# Patient Record
Sex: Male | Born: 1945
Health system: Southern US, Community
[De-identification: ages and names within clinical notes are randomized; demographics above are authoritative.]

## PROBLEM LIST (undated history)

## (undated) DIAGNOSIS — Z87442 Personal history of urinary calculi: Secondary | ICD-10-CM

## (undated) DIAGNOSIS — R0600 Dyspnea, unspecified: Secondary | ICD-10-CM

## (undated) DIAGNOSIS — Z86010 Personal history of colonic polyps: Secondary | ICD-10-CM

## (undated) DIAGNOSIS — E039 Hypothyroidism, unspecified: Secondary | ICD-10-CM

## (undated) DIAGNOSIS — K219 Gastro-esophageal reflux disease without esophagitis: Secondary | ICD-10-CM

## (undated) DIAGNOSIS — G4730 Sleep apnea, unspecified: Secondary | ICD-10-CM

## (undated) DIAGNOSIS — M1990 Unspecified osteoarthritis, unspecified site: Secondary | ICD-10-CM

## (undated) DIAGNOSIS — T7840XA Allergy, unspecified, initial encounter: Secondary | ICD-10-CM

## (undated) DIAGNOSIS — E785 Hyperlipidemia, unspecified: Secondary | ICD-10-CM

## (undated) DIAGNOSIS — F419 Anxiety disorder, unspecified: Secondary | ICD-10-CM

## (undated) DIAGNOSIS — G709 Myoneural disorder, unspecified: Secondary | ICD-10-CM

## (undated) DIAGNOSIS — F32A Depression, unspecified: Secondary | ICD-10-CM

## (undated) DIAGNOSIS — I2510 Atherosclerotic heart disease of native coronary artery without angina pectoris: Secondary | ICD-10-CM

## (undated) DIAGNOSIS — Z8719 Personal history of other diseases of the digestive system: Secondary | ICD-10-CM

## (undated) DIAGNOSIS — I714 Abdominal aortic aneurysm, without rupture, unspecified: Secondary | ICD-10-CM

## (undated) DIAGNOSIS — R42 Dizziness and giddiness: Secondary | ICD-10-CM

## (undated) HISTORY — DX: Unspecified osteoarthritis, unspecified site: M19.90

## (undated) HISTORY — PX: COLONOSCOPY: SHX174

## (undated) HISTORY — DX: Anxiety disorder, unspecified: F41.9

## (undated) HISTORY — PX: APPENDECTOMY: SHX54

## (undated) HISTORY — PX: KNEE SURGERY: SHX244

## (undated) HISTORY — PX: CHOLECYSTECTOMY: SHX55

## (undated) HISTORY — PX: HERNIA REPAIR: SHX51

## (undated) HISTORY — PX: ELBOW SURGERY: SHX618

## (undated) HISTORY — DX: Allergy, unspecified, initial encounter: T78.40XA

## (undated) HISTORY — DX: Sleep apnea, unspecified: G47.30

## (undated) HISTORY — DX: Personal history of colonic polyps: Z86.010

## (undated) HISTORY — PX: POLYPECTOMY: SHX149

## (undated) HISTORY — DX: Myoneural disorder, unspecified: G70.9

## (undated) HISTORY — PX: TONSILLECTOMY: SUR1361

## (undated) HISTORY — DX: Gastro-esophageal reflux disease without esophagitis: K21.9

## (undated) HISTORY — DX: Hyperlipidemia, unspecified: E78.5

---

## 2011-02-25 DIAGNOSIS — M75100 Unspecified rotator cuff tear or rupture of unspecified shoulder, not specified as traumatic: Secondary | ICD-10-CM | POA: Diagnosis not present

## 2011-02-25 DIAGNOSIS — M19019 Primary osteoarthritis, unspecified shoulder: Secondary | ICD-10-CM | POA: Diagnosis not present

## 2011-02-25 DIAGNOSIS — M719 Bursopathy, unspecified: Secondary | ICD-10-CM | POA: Diagnosis not present

## 2011-02-25 DIAGNOSIS — M24119 Other articular cartilage disorders, unspecified shoulder: Secondary | ICD-10-CM | POA: Diagnosis not present

## 2011-02-25 DIAGNOSIS — S46919A Strain of unspecified muscle, fascia and tendon at shoulder and upper arm level, unspecified arm, initial encounter: Secondary | ICD-10-CM | POA: Diagnosis not present

## 2011-02-25 DIAGNOSIS — S43499A Other sprain of unspecified shoulder joint, initial encounter: Secondary | ICD-10-CM | POA: Diagnosis not present

## 2011-02-25 DIAGNOSIS — M67919 Unspecified disorder of synovium and tendon, unspecified shoulder: Secondary | ICD-10-CM | POA: Diagnosis not present

## 2011-02-25 DIAGNOSIS — Z4789 Encounter for other orthopedic aftercare: Secondary | ICD-10-CM | POA: Diagnosis not present

## 2011-02-27 DIAGNOSIS — S42293A Other displaced fracture of upper end of unspecified humerus, initial encounter for closed fracture: Secondary | ICD-10-CM | POA: Diagnosis not present

## 2011-03-09 ENCOUNTER — Other Ambulatory Visit: Payer: Self-pay | Admitting: Orthopedic Surgery

## 2011-03-09 ENCOUNTER — Ambulatory Visit
Admission: RE | Admit: 2011-03-09 | Discharge: 2011-03-09 | Disposition: A | Discharge status: Home / Self Care | Payer: No Typology Code available for payment source | Source: Ambulatory Visit | Attending: Orthopedic Surgery | Admitting: Orthopedic Surgery

## 2011-03-09 DIAGNOSIS — S42209A Unspecified fracture of upper end of unspecified humerus, initial encounter for closed fracture: Secondary | ICD-10-CM

## 2011-03-09 DIAGNOSIS — IMO0002 Reserved for concepts with insufficient information to code with codable children: Secondary | ICD-10-CM | POA: Diagnosis not present

## 2011-03-31 DIAGNOSIS — Z860100 Personal history of colon polyps, unspecified: Secondary | ICD-10-CM | POA: Insufficient documentation

## 2011-03-31 HISTORY — DX: Personal history of colonic polyps: Z86.010

## 2011-03-31 HISTORY — DX: Personal history of colon polyps, unspecified: Z86.0100

## 2011-05-29 DIAGNOSIS — M25519 Pain in unspecified shoulder: Secondary | ICD-10-CM | POA: Diagnosis not present

## 2011-07-07 DIAGNOSIS — IMO0002 Reserved for concepts with insufficient information to code with codable children: Secondary | ICD-10-CM | POA: Diagnosis not present

## 2011-07-07 DIAGNOSIS — S42213A Unspecified displaced fracture of surgical neck of unspecified humerus, initial encounter for closed fracture: Secondary | ICD-10-CM | POA: Diagnosis not present

## 2011-07-10 DIAGNOSIS — M25519 Pain in unspecified shoulder: Secondary | ICD-10-CM | POA: Diagnosis not present

## 2011-07-30 DIAGNOSIS — S33101A Dislocation of unspecified lumbar vertebra, initial encounter: Secondary | ICD-10-CM | POA: Diagnosis not present

## 2011-07-30 DIAGNOSIS — M999 Biomechanical lesion, unspecified: Secondary | ICD-10-CM | POA: Diagnosis not present

## 2011-07-31 DIAGNOSIS — S33101A Dislocation of unspecified lumbar vertebra, initial encounter: Secondary | ICD-10-CM | POA: Diagnosis not present

## 2011-07-31 DIAGNOSIS — M999 Biomechanical lesion, unspecified: Secondary | ICD-10-CM | POA: Diagnosis not present

## 2011-08-03 DIAGNOSIS — S33101A Dislocation of unspecified lumbar vertebra, initial encounter: Secondary | ICD-10-CM | POA: Diagnosis not present

## 2011-08-03 DIAGNOSIS — M999 Biomechanical lesion, unspecified: Secondary | ICD-10-CM | POA: Diagnosis not present

## 2011-08-04 DIAGNOSIS — S33101A Dislocation of unspecified lumbar vertebra, initial encounter: Secondary | ICD-10-CM | POA: Diagnosis not present

## 2011-08-04 DIAGNOSIS — M999 Biomechanical lesion, unspecified: Secondary | ICD-10-CM | POA: Diagnosis not present

## 2011-08-11 DIAGNOSIS — M999 Biomechanical lesion, unspecified: Secondary | ICD-10-CM | POA: Diagnosis not present

## 2011-08-11 DIAGNOSIS — S33101A Dislocation of unspecified lumbar vertebra, initial encounter: Secondary | ICD-10-CM | POA: Diagnosis not present

## 2011-08-18 DIAGNOSIS — S33101A Dislocation of unspecified lumbar vertebra, initial encounter: Secondary | ICD-10-CM | POA: Diagnosis not present

## 2011-08-18 DIAGNOSIS — M999 Biomechanical lesion, unspecified: Secondary | ICD-10-CM | POA: Diagnosis not present

## 2011-08-24 DIAGNOSIS — M999 Biomechanical lesion, unspecified: Secondary | ICD-10-CM | POA: Diagnosis not present

## 2011-08-24 DIAGNOSIS — S33101A Dislocation of unspecified lumbar vertebra, initial encounter: Secondary | ICD-10-CM | POA: Diagnosis not present

## 2011-09-08 DIAGNOSIS — S33101A Dislocation of unspecified lumbar vertebra, initial encounter: Secondary | ICD-10-CM | POA: Diagnosis not present

## 2011-09-08 DIAGNOSIS — M999 Biomechanical lesion, unspecified: Secondary | ICD-10-CM | POA: Diagnosis not present

## 2011-10-09 DIAGNOSIS — M999 Biomechanical lesion, unspecified: Secondary | ICD-10-CM | POA: Diagnosis not present

## 2011-10-09 DIAGNOSIS — S33101A Dislocation of unspecified lumbar vertebra, initial encounter: Secondary | ICD-10-CM | POA: Diagnosis not present

## 2012-06-27 DIAGNOSIS — M25569 Pain in unspecified knee: Secondary | ICD-10-CM | POA: Diagnosis not present

## 2012-06-27 DIAGNOSIS — M1710 Unilateral primary osteoarthritis, unspecified knee: Secondary | ICD-10-CM | POA: Diagnosis not present

## 2012-07-04 DIAGNOSIS — T148XXA Other injury of unspecified body region, initial encounter: Secondary | ICD-10-CM | POA: Diagnosis not present

## 2012-07-04 DIAGNOSIS — Y92009 Unspecified place in unspecified non-institutional (private) residence as the place of occurrence of the external cause: Secondary | ICD-10-CM | POA: Diagnosis not present

## 2012-07-04 DIAGNOSIS — W260XXA Contact with knife, initial encounter: Secondary | ICD-10-CM | POA: Diagnosis not present

## 2012-07-04 DIAGNOSIS — W261XXA Contact with sword or dagger, initial encounter: Secondary | ICD-10-CM | POA: Diagnosis not present

## 2012-08-16 DIAGNOSIS — N200 Calculus of kidney: Secondary | ICD-10-CM | POA: Diagnosis not present

## 2012-08-16 DIAGNOSIS — N410 Acute prostatitis: Secondary | ICD-10-CM | POA: Diagnosis not present

## 2012-08-16 DIAGNOSIS — N1330 Unspecified hydronephrosis: Secondary | ICD-10-CM | POA: Diagnosis not present

## 2012-08-16 DIAGNOSIS — N390 Urinary tract infection, site not specified: Secondary | ICD-10-CM | POA: Diagnosis not present

## 2012-08-16 DIAGNOSIS — Z79899 Other long term (current) drug therapy: Secondary | ICD-10-CM | POA: Diagnosis not present

## 2012-08-16 DIAGNOSIS — N201 Calculus of ureter: Secondary | ICD-10-CM | POA: Diagnosis not present

## 2012-08-16 DIAGNOSIS — Z7982 Long term (current) use of aspirin: Secondary | ICD-10-CM | POA: Diagnosis not present

## 2012-09-05 DIAGNOSIS — M999 Biomechanical lesion, unspecified: Secondary | ICD-10-CM | POA: Diagnosis not present

## 2012-09-05 DIAGNOSIS — S33101A Dislocation of unspecified lumbar vertebra, initial encounter: Secondary | ICD-10-CM | POA: Diagnosis not present

## 2012-09-06 DIAGNOSIS — M999 Biomechanical lesion, unspecified: Secondary | ICD-10-CM | POA: Diagnosis not present

## 2012-09-06 DIAGNOSIS — S33101A Dislocation of unspecified lumbar vertebra, initial encounter: Secondary | ICD-10-CM | POA: Diagnosis not present

## 2012-09-08 DIAGNOSIS — S33101A Dislocation of unspecified lumbar vertebra, initial encounter: Secondary | ICD-10-CM | POA: Diagnosis not present

## 2012-09-08 DIAGNOSIS — M999 Biomechanical lesion, unspecified: Secondary | ICD-10-CM | POA: Diagnosis not present

## 2012-11-02 DIAGNOSIS — M999 Biomechanical lesion, unspecified: Secondary | ICD-10-CM | POA: Diagnosis not present

## 2012-11-02 DIAGNOSIS — S33101A Dislocation of unspecified lumbar vertebra, initial encounter: Secondary | ICD-10-CM | POA: Diagnosis not present

## 2012-11-03 DIAGNOSIS — M999 Biomechanical lesion, unspecified: Secondary | ICD-10-CM | POA: Diagnosis not present

## 2012-11-03 DIAGNOSIS — S33101A Dislocation of unspecified lumbar vertebra, initial encounter: Secondary | ICD-10-CM | POA: Diagnosis not present

## 2012-11-04 DIAGNOSIS — S33101A Dislocation of unspecified lumbar vertebra, initial encounter: Secondary | ICD-10-CM | POA: Diagnosis not present

## 2012-11-04 DIAGNOSIS — M999 Biomechanical lesion, unspecified: Secondary | ICD-10-CM | POA: Diagnosis not present

## 2012-11-07 DIAGNOSIS — M5137 Other intervertebral disc degeneration, lumbosacral region: Secondary | ICD-10-CM | POA: Diagnosis not present

## 2012-11-07 DIAGNOSIS — M47817 Spondylosis without myelopathy or radiculopathy, lumbosacral region: Secondary | ICD-10-CM | POA: Diagnosis not present

## 2012-11-07 DIAGNOSIS — M25559 Pain in unspecified hip: Secondary | ICD-10-CM | POA: Diagnosis not present

## 2012-11-08 DIAGNOSIS — M519 Unspecified thoracic, thoracolumbar and lumbosacral intervertebral disc disorder: Secondary | ICD-10-CM | POA: Diagnosis not present

## 2012-11-08 DIAGNOSIS — M47817 Spondylosis without myelopathy or radiculopathy, lumbosacral region: Secondary | ICD-10-CM | POA: Diagnosis not present

## 2012-11-08 DIAGNOSIS — I714 Abdominal aortic aneurysm, without rupture: Secondary | ICD-10-CM | POA: Diagnosis not present

## 2012-11-08 DIAGNOSIS — Z23 Encounter for immunization: Secondary | ICD-10-CM | POA: Diagnosis not present

## 2012-11-14 DIAGNOSIS — M47817 Spondylosis without myelopathy or radiculopathy, lumbosacral region: Secondary | ICD-10-CM | POA: Diagnosis not present

## 2012-11-23 DIAGNOSIS — M5137 Other intervertebral disc degeneration, lumbosacral region: Secondary | ICD-10-CM | POA: Diagnosis not present

## 2012-11-23 DIAGNOSIS — IMO0002 Reserved for concepts with insufficient information to code with codable children: Secondary | ICD-10-CM | POA: Diagnosis not present

## 2013-03-10 DIAGNOSIS — M5137 Other intervertebral disc degeneration, lumbosacral region: Secondary | ICD-10-CM | POA: Diagnosis not present

## 2013-03-10 DIAGNOSIS — IMO0002 Reserved for concepts with insufficient information to code with codable children: Secondary | ICD-10-CM | POA: Diagnosis not present

## 2013-04-17 DIAGNOSIS — H9209 Otalgia, unspecified ear: Secondary | ICD-10-CM | POA: Diagnosis not present

## 2013-04-17 DIAGNOSIS — H6690 Otitis media, unspecified, unspecified ear: Secondary | ICD-10-CM | POA: Diagnosis not present

## 2013-04-19 DIAGNOSIS — H9210 Otorrhea, unspecified ear: Secondary | ICD-10-CM | POA: Diagnosis not present

## 2013-04-19 DIAGNOSIS — H60399 Other infective otitis externa, unspecified ear: Secondary | ICD-10-CM | POA: Diagnosis not present

## 2013-04-19 DIAGNOSIS — H7290 Unspecified perforation of tympanic membrane, unspecified ear: Secondary | ICD-10-CM | POA: Diagnosis not present

## 2013-04-19 DIAGNOSIS — B369 Superficial mycosis, unspecified: Secondary | ICD-10-CM | POA: Diagnosis not present

## 2013-04-28 DIAGNOSIS — H60399 Other infective otitis externa, unspecified ear: Secondary | ICD-10-CM | POA: Diagnosis not present

## 2013-04-28 DIAGNOSIS — B369 Superficial mycosis, unspecified: Secondary | ICD-10-CM | POA: Diagnosis not present

## 2013-04-28 DIAGNOSIS — H7290 Unspecified perforation of tympanic membrane, unspecified ear: Secondary | ICD-10-CM | POA: Diagnosis not present

## 2013-04-28 DIAGNOSIS — H9210 Otorrhea, unspecified ear: Secondary | ICD-10-CM | POA: Diagnosis not present

## 2013-05-04 DIAGNOSIS — H7290 Unspecified perforation of tympanic membrane, unspecified ear: Secondary | ICD-10-CM | POA: Diagnosis not present

## 2013-05-04 DIAGNOSIS — B369 Superficial mycosis, unspecified: Secondary | ICD-10-CM | POA: Diagnosis not present

## 2013-05-04 DIAGNOSIS — H9210 Otorrhea, unspecified ear: Secondary | ICD-10-CM | POA: Diagnosis not present

## 2013-05-04 DIAGNOSIS — H60399 Other infective otitis externa, unspecified ear: Secondary | ICD-10-CM | POA: Diagnosis not present

## 2013-05-08 DIAGNOSIS — H608X9 Other otitis externa, unspecified ear: Secondary | ICD-10-CM | POA: Diagnosis not present

## 2013-05-15 DIAGNOSIS — H608X9 Other otitis externa, unspecified ear: Secondary | ICD-10-CM | POA: Diagnosis not present

## 2013-05-15 DIAGNOSIS — B369 Superficial mycosis, unspecified: Secondary | ICD-10-CM | POA: Diagnosis not present

## 2014-01-31 DIAGNOSIS — M542 Cervicalgia: Secondary | ICD-10-CM | POA: Diagnosis not present

## 2014-01-31 DIAGNOSIS — M9902 Segmental and somatic dysfunction of thoracic region: Secondary | ICD-10-CM | POA: Diagnosis not present

## 2014-01-31 DIAGNOSIS — M9901 Segmental and somatic dysfunction of cervical region: Secondary | ICD-10-CM | POA: Diagnosis not present

## 2014-02-01 DIAGNOSIS — M9901 Segmental and somatic dysfunction of cervical region: Secondary | ICD-10-CM | POA: Diagnosis not present

## 2014-02-01 DIAGNOSIS — M542 Cervicalgia: Secondary | ICD-10-CM | POA: Diagnosis not present

## 2014-02-01 DIAGNOSIS — M9902 Segmental and somatic dysfunction of thoracic region: Secondary | ICD-10-CM | POA: Diagnosis not present

## 2014-02-02 DIAGNOSIS — M9902 Segmental and somatic dysfunction of thoracic region: Secondary | ICD-10-CM | POA: Diagnosis not present

## 2014-02-02 DIAGNOSIS — M542 Cervicalgia: Secondary | ICD-10-CM | POA: Diagnosis not present

## 2014-02-02 DIAGNOSIS — M9901 Segmental and somatic dysfunction of cervical region: Secondary | ICD-10-CM | POA: Diagnosis not present

## 2014-05-07 DIAGNOSIS — H8111 Benign paroxysmal vertigo, right ear: Secondary | ICD-10-CM | POA: Diagnosis not present

## 2014-12-26 DIAGNOSIS — Z23 Encounter for immunization: Secondary | ICD-10-CM | POA: Diagnosis not present

## 2014-12-26 DIAGNOSIS — S0101XA Laceration without foreign body of scalp, initial encounter: Secondary | ICD-10-CM | POA: Diagnosis not present

## 2015-09-30 DIAGNOSIS — M19012 Primary osteoarthritis, left shoulder: Secondary | ICD-10-CM | POA: Diagnosis not present

## 2015-09-30 DIAGNOSIS — M25511 Pain in right shoulder: Secondary | ICD-10-CM | POA: Diagnosis not present

## 2015-09-30 DIAGNOSIS — S46911D Strain of unspecified muscle, fascia and tendon at shoulder and upper arm level, right arm, subsequent encounter: Secondary | ICD-10-CM | POA: Diagnosis not present

## 2015-12-04 DIAGNOSIS — H60333 Swimmer's ear, bilateral: Secondary | ICD-10-CM | POA: Diagnosis not present

## 2015-12-17 DIAGNOSIS — M25511 Pain in right shoulder: Secondary | ICD-10-CM | POA: Diagnosis not present

## 2015-12-17 DIAGNOSIS — M25512 Pain in left shoulder: Secondary | ICD-10-CM | POA: Diagnosis not present

## 2015-12-26 DIAGNOSIS — M25511 Pain in right shoulder: Secondary | ICD-10-CM | POA: Diagnosis not present

## 2016-01-01 DIAGNOSIS — M25511 Pain in right shoulder: Secondary | ICD-10-CM | POA: Diagnosis not present

## 2016-01-01 DIAGNOSIS — M25512 Pain in left shoulder: Secondary | ICD-10-CM | POA: Diagnosis not present

## 2016-02-21 DIAGNOSIS — H8149 Vertigo of central origin, unspecified ear: Secondary | ICD-10-CM | POA: Diagnosis not present

## 2016-02-24 HISTORY — PX: POLYPECTOMY: SHX149

## 2016-03-19 DIAGNOSIS — Z789 Other specified health status: Secondary | ICD-10-CM | POA: Diagnosis not present

## 2016-03-19 DIAGNOSIS — R208 Other disturbances of skin sensation: Secondary | ICD-10-CM | POA: Diagnosis not present

## 2016-03-19 DIAGNOSIS — L820 Inflamed seborrheic keratosis: Secondary | ICD-10-CM | POA: Diagnosis not present

## 2016-03-19 DIAGNOSIS — L538 Other specified erythematous conditions: Secondary | ICD-10-CM | POA: Diagnosis not present

## 2016-04-09 DIAGNOSIS — M25511 Pain in right shoulder: Secondary | ICD-10-CM | POA: Diagnosis not present

## 2016-04-17 ENCOUNTER — Telehealth: Payer: Self-pay | Admitting: Gastroenterology

## 2016-04-17 NOTE — Telephone Encounter (Signed)
Received referral from [facility] to schedule patient a colonoscopy. Dr. Ardis Hughs is Doc of the Day for 02/23/18am. Records placed on his desk for review.

## 2016-04-20 NOTE — Telephone Encounter (Signed)
Correction:  Patient called back and requesting to see Dr. Carlean Purl because his wife and son is a patient of yours and patient stated "I think Dr. Carlean Purl walks on water".  Would you accept patient Dr. Carlean Purl?

## 2016-04-27 ENCOUNTER — Encounter: Payer: Self-pay | Admitting: Internal Medicine

## 2016-05-19 ENCOUNTER — Ambulatory Visit (AMBULATORY_SURGERY_CENTER): Payer: Self-pay | Admitting: *Deleted

## 2016-05-19 VITALS — Ht 70.0 in | Wt 220.0 lb

## 2016-05-19 DIAGNOSIS — Z86010 Personal history of colonic polyps: Secondary | ICD-10-CM

## 2016-05-19 NOTE — Progress Notes (Signed)
No egg or soy allergy known to patient  No issues with past sedation with any surgeries  or procedures, no intubation problems  No diet pills per patient No home 02 use per patient  No blood thinners per patient  Pt denies issues with constipation  No A fib or A flutter   

## 2016-06-01 ENCOUNTER — Encounter: Payer: Self-pay | Admitting: Internal Medicine

## 2016-06-01 ENCOUNTER — Ambulatory Visit (AMBULATORY_SURGERY_CENTER): Payer: Medicare Other | Admitting: Internal Medicine

## 2016-06-01 VITALS — BP 133/85 | HR 70 | Temp 97.7°F | Resp 11 | Ht 70.0 in | Wt 220.0 lb

## 2016-06-01 DIAGNOSIS — Z86010 Personal history of colonic polyps: Secondary | ICD-10-CM

## 2016-06-01 DIAGNOSIS — D124 Benign neoplasm of descending colon: Secondary | ICD-10-CM | POA: Diagnosis not present

## 2016-06-01 DIAGNOSIS — D122 Benign neoplasm of ascending colon: Secondary | ICD-10-CM

## 2016-06-01 DIAGNOSIS — Z1211 Encounter for screening for malignant neoplasm of colon: Secondary | ICD-10-CM | POA: Diagnosis not present

## 2016-06-01 MED ORDER — SODIUM CHLORIDE 0.9 % IV SOLN: 500.0000 mL | INTRAVENOUS | Status: DC

## 2016-06-01 NOTE — Progress Notes (Signed)
Called to room to assist during endoscopic procedure.  Patient ID and intended procedure confirmed with present staff. Received instructions for my participation in the procedure from the performing physician.  

## 2016-06-01 NOTE — Progress Notes (Signed)
Report given to PACU, vss 

## 2016-06-01 NOTE — Patient Instructions (Addendum)
I found and removed 2 polyps - both looked small and benign.  You also have a condition called diverticulosis - common and not usually a problem. Please read the handout provided.  I will let you know pathology results and when to have another routine colonoscopy by mail and/or My Chart.  I appreciate the opportunity to care for you. Gatha Mayer, MD, FACG   YOU HAD AN ENDOSCOPIC PROCEDURE TODAY AT [facility]:   Refer to the procedure report that was given to you for any specific questions about what was found during the examination.  If the procedure report does not answer your questions, please call your gastroenterologist to clarify.  If you requested that your care partner not be given the details of your procedure findings, then the procedure report has been included in a sealed envelope for you to review at your convenience later.  YOU SHOULD EXPECT: Some feelings of bloating in the abdomen. Passage of more gas than usual.  Walking can help get rid of the air that was put into your GI tract during the procedure and reduce the bloating. If you had a lower endoscopy (such as a colonoscopy or flexible sigmoidoscopy) you may notice spotting of blood in your stool or on the toilet paper. If you underwent a bowel prep for your procedure, you may not have a normal bowel movement for a few days.  Please Note:  You might notice some irritation and congestion in your nose or some drainage.  This is from the oxygen used during your procedure.  There is no need for concern and it should clear up in a day or so.  SYMPTOMS TO REPORT IMMEDIATELY:   Following lower endoscopy (colonoscopy or flexible sigmoidoscopy):  Excessive amounts of blood in the stool  Significant tenderness or worsening of abdominal pains  Swelling of the abdomen that is new, acute  Fever of 100F or higher    For urgent or emergent issues, a gastroenterologist can be reached at any hour by calling  [phone].   DIET:  We do recommend a small meal at first, but then you may proceed to your regular diet.  Drink plenty of fluids but you should avoid alcoholic beverages for 24 hours.  ACTIVITY:  You should plan to take it easy for the rest of today and you should NOT DRIVE or use heavy machinery until tomorrow (because of the sedation medicines used during the test).    FOLLOW UP: Our staff will call the number listed on your records the next business day following your procedure to check on you and address any questions or concerns that you may have regarding the information given to you following your procedure. If we do not reach you, we will leave a message.  However, if you are feeling well and you are not experiencing any problems, there is no need to return our call.  We will assume that you have returned to your regular daily activities without incident.  If any biopsies were taken you will be contacted by phone or by letter within the next 1-3 weeks.  Please call us at [phone] if you have not heard about the biopsies in 3 weeks.    SIGNATURES/CONFIDENTIALITY: You and/or your care partner have signed paperwork which will be entered into your electronic medical record.  These signatures attest to the fact that that the information above on your After Visit Summary has been reviewed and is understood.  Full  responsibility of the confidentiality of this discharge information lies with you and/or your care-partner.   Information on polyps and diverticulosis given to you today  Await pathology results in a letter from Dr Carlean Purl.

## 2016-06-01 NOTE — Op Note (Signed)
[place] Patient Name: Hector Morris Procedure Date: 06/01/2016 2:47 PM MRN: [id number] Endoscopist: Gatha Mayer , MD Age: 71 Referring MD:  Date of Birth: 1945/06/06 Gender: Male Account #: [id number] Procedure:                Colonoscopy Indications:              Surveillance: Personal history of adenomatous                            polyps on last colonoscopy 5 years ago Medicines:                Propofol per Anesthesia, Monitored Anesthesia Care Procedure:                Pre-Anesthesia Assessment:                           - Prior to the procedure, a History and Physical                            was performed, and patient medications and                            allergies were reviewed. The patient's tolerance of                            previous anesthesia was also reviewed. The risks                            and benefits of the procedure and the sedation                            options and risks were discussed with the patient.                            All questions were answered, and informed consent                            was obtained. Prior Anticoagulants: The patient                            last took aspirin 4 days prior to the procedure.                            ASA Grade Assessment: I - A normal, healthy                            patient. After reviewing the risks and benefits,                            the patient was deemed in satisfactory condition to                            undergo the procedure.  After obtaining informed consent, the colonoscope                            was passed under direct vision. Throughout the                            procedure, the patient's blood pressure, pulse, and                            oxygen saturations were monitored continuously. The                            Colonoscope was introduced through the anus and                            advanced to the the cecum,  identified by                            appendiceal orifice and ileocecal valve. The                            colonoscopy was performed without difficulty. The                            patient tolerated the procedure well. The quality                            of the bowel preparation was good. The ileocecal                            valve, appendiceal orifice, and rectum were                            photographed. The bowel preparation used was                            Miralax. Scope In: 3:12:21 PM Scope Out: 3:32:19 PM Scope Withdrawal Time: 0 hours 16 minutes 24 seconds  Total Procedure Duration: 0 hours 19 minutes 58 seconds  Findings:                 The perianal and digital rectal examinations were                            normal. Pertinent negatives include normal prostate                            (size, shape, and consistency).                           Two flat and sessile polyps were found in the                            descending colon and ascending colon. The polyps  were 5 to 7 mm in size. These polyps were removed                            with a cold snare. Resection and retrieval were                            complete. Verification of patient identification                            for the specimen was done. Estimated blood loss was                            minimal.                           Many small and large-mouthed diverticula were found                            in the sigmoid colon. There was narrowing of the                            colon in association with the diverticular opening.                           Scattered small and large-mouthed diverticula were                            found in the right colon.                           The exam was otherwise without abnormality on                            direct and retroflexion views. Complications:            No immediate complications. Estimated Blood  Loss:     Estimated blood loss was minimal. Impression:               - Two 5 to 7 mm polyps in the descending colon and                            in the ascending colon, removed with a cold snare.                            Resected and retrieved.                           - Severe diverticulosis in the sigmoid colon. There                            was narrowing of the colon in association with the                            diverticular opening.                           -  Mild diverticulosis in the right colon.                           - The examination was otherwise normal on direct                            and retroflexion views.                           - Personal history of colonic polyps. 2 diminutive                            adenomas 2013 Recommendation:           - Patient has a contact number available for                            emergencies. The signs and symptoms of potential                            delayed complications were discussed with the                            patient. Return to normal activities tomorrow.                            Written discharge instructions were provided to the                            patient.                           - Resume previous diet.                           - Continue present medications.                           - Repeat colonoscopy is recommended. The                            colonoscopy date will be determined after pathology                            results from today's exam become available for                            review.                           - Will send copies of note and pathology to [facility] attention Dr. Gean Maidens Fax [phone] Gatha Mayer, MD 06/01/2016 3:42:41 PM This report has been signed electronically.

## 2016-06-02 ENCOUNTER — Telehealth: Payer: Self-pay | Admitting: *Deleted

## 2016-06-02 NOTE — Telephone Encounter (Signed)
  Follow up Call-  Call back number 06/01/2016  Post procedure Call Back phone  # [phone]  Permission to leave phone message Yes  Some recent data might be hidden     Patient questions:  Do you have a fever, pain , or abdominal swelling? No. Pain Score  0 *  Have you tolerated food without any problems? Yes.    Have you been able to return to your normal activities? Yes.    Do you have any questions about your discharge instructions: Diet   No. Medications  No. Follow up visit  No.  Do you have questions or concerns about your Care? No.  Actions: * If pain score is 4 or above: No action needed, pain <4.

## 2016-06-02 NOTE — Telephone Encounter (Signed)
No answer left message and will attempt to call back later this afternoon. Sm 

## 2016-06-10 ENCOUNTER — Encounter: Payer: Self-pay | Admitting: Internal Medicine

## 2016-06-10 DIAGNOSIS — Z86010 Personal history of colonic polyps: Secondary | ICD-10-CM

## 2016-06-10 NOTE — Progress Notes (Signed)
2 small adenomas max 7 mm recall 2023

## 2016-06-10 NOTE — Progress Notes (Signed)
Please cc procedure note and path report to the [facility] - see colonoscopy note for name of MD to send to

## 2016-06-19 ENCOUNTER — Encounter: Payer: No Typology Code available for payment source | Admitting: Gastroenterology

## 2016-11-21 DIAGNOSIS — L237 Allergic contact dermatitis due to plants, except food: Secondary | ICD-10-CM | POA: Diagnosis not present

## 2017-01-25 DIAGNOSIS — M25561 Pain in right knee: Secondary | ICD-10-CM | POA: Diagnosis not present

## 2017-02-16 DIAGNOSIS — W01198A Fall on same level from slipping, tripping and stumbling with subsequent striking against other object, initial encounter: Secondary | ICD-10-CM | POA: Diagnosis not present

## 2017-02-16 DIAGNOSIS — Z79899 Other long term (current) drug therapy: Secondary | ICD-10-CM | POA: Diagnosis not present

## 2017-02-16 DIAGNOSIS — S0990XA Unspecified injury of head, initial encounter: Secondary | ICD-10-CM | POA: Diagnosis not present

## 2017-02-16 DIAGNOSIS — S2232XA Fracture of one rib, left side, initial encounter for closed fracture: Secondary | ICD-10-CM | POA: Diagnosis not present

## 2017-02-16 DIAGNOSIS — E7800 Pure hypercholesterolemia, unspecified: Secondary | ICD-10-CM | POA: Diagnosis not present

## 2017-02-16 DIAGNOSIS — S299XXA Unspecified injury of thorax, initial encounter: Secondary | ICD-10-CM | POA: Diagnosis not present

## 2017-02-16 DIAGNOSIS — S3991XA Unspecified injury of abdomen, initial encounter: Secondary | ICD-10-CM | POA: Diagnosis not present

## 2017-02-16 DIAGNOSIS — S199XXA Unspecified injury of neck, initial encounter: Secondary | ICD-10-CM | POA: Diagnosis not present

## 2017-02-16 DIAGNOSIS — Z7982 Long term (current) use of aspirin: Secondary | ICD-10-CM | POA: Diagnosis not present

## 2017-02-16 DIAGNOSIS — S20212A Contusion of left front wall of thorax, initial encounter: Secondary | ICD-10-CM | POA: Diagnosis not present

## 2017-02-16 DIAGNOSIS — R109 Unspecified abdominal pain: Secondary | ICD-10-CM | POA: Diagnosis not present

## 2017-03-01 DIAGNOSIS — S2232XD Fracture of one rib, left side, subsequent encounter for fracture with routine healing: Secondary | ICD-10-CM | POA: Diagnosis not present

## 2017-03-01 DIAGNOSIS — I714 Abdominal aortic aneurysm, without rupture: Secondary | ICD-10-CM | POA: Diagnosis not present

## 2017-03-08 ENCOUNTER — Encounter: Payer: Self-pay | Admitting: Internal Medicine

## 2017-03-08 ENCOUNTER — Ambulatory Visit (INDEPENDENT_AMBULATORY_CARE_PROVIDER_SITE_OTHER): Payer: Medicare Other | Admitting: Internal Medicine

## 2017-03-08 VITALS — BP 136/72 | HR 77 | Ht 70.0 in | Wt 225.4 lb

## 2017-03-08 DIAGNOSIS — I712 Thoracic aortic aneurysm, without rupture, unspecified: Secondary | ICD-10-CM

## 2017-03-08 DIAGNOSIS — I714 Abdominal aortic aneurysm, without rupture, unspecified: Secondary | ICD-10-CM | POA: Insufficient documentation

## 2017-03-08 DIAGNOSIS — I2510 Atherosclerotic heart disease of native coronary artery without angina pectoris: Secondary | ICD-10-CM | POA: Insufficient documentation

## 2017-03-08 DIAGNOSIS — I2584 Coronary atherosclerosis due to calcified coronary lesion: Secondary | ICD-10-CM | POA: Diagnosis not present

## 2017-03-08 DIAGNOSIS — Z01810 Encounter for preprocedural cardiovascular examination: Secondary | ICD-10-CM | POA: Diagnosis not present

## 2017-03-08 NOTE — Patient Instructions (Addendum)
Medication Instructions:   Your physician recommends that you continue on your current medications as directed. Please refer to the Current Medication list given to you today.  Labwork:  FASTING blood work to check cholesterol Do not eat/drink after midnight the evening prior to your blood work. You can use any [facility] or you can have the blood work done in our office the day of your stress test.  Testing/Procedures:  Your physician has requested that you have a lexiscan myoview. For further information please visit [email or web address]. Please follow instruction sheet, as given.  Follow-Up:  Your physician recommends that you schedule a follow-up appointment with Dr. Debara Pickett after your stress test.  You have been referred to Dr. Trula Slade @ [facility] [address], [place], [place] [address]  [phone]  If you need a refill on your cardiac medications before your next appointment, please call your pharmacy.  Any Other Special Instructions Will Be Listed Below (If Applicable).   Thank you for choosing [facility]!!

## 2017-03-08 NOTE — Progress Notes (Signed)
OFFICE CONSULT NOTE  Chief Complaint:  Aneurysm  Primary Care Physician: Patient, No Pcp Per  HPI:  Hector Morris is a 72 y.o. male who is being seen today for the evaluation of aneurysm at the request of Hector Chol, MD. This is a pleasant 72 year old male who lives in [place].  He had a recent fall and he was evaluated by CT scan.  The CT scan performed on 02/16/2017, demonstrated a broken left rib which was managed medically, but incidentally demonstrated a 4.3 cm ascending thoracic aortic aneurysm, atherosclerosis of the thoracic aorta and a 5 cm infrarenal abdominal aortic aneurysm.  Annual follow-up of the ascending aortic aneurysm was recommended and referral to vascular surgery based on ACR consensus guidelines.  In addition, Hector Morris had a CT scan of the left shoulder in 2013 after a humeral fracture.  He was run over by a bull at that time.  CAT scan demonstrated coronary artery atherosclerosis, however he was not aware that he had that.  He does have a history of dyslipidemia and has been on simvastatin 40 mg nightly.  He reports remote cardiac catheterization in 1993 which showed no significant coronary disease.  He says he has had long-standing reflux symptoms although recently has been having some chest discomfort.  Most of the time and seems to improve with Tums or reflux medication and he has a known hiatal hernia.  PMHx:  Past Medical History:  Diagnosis Date  . Allergy   . Anxiety   . Arthritis   . GERD (gastroesophageal reflux disease)   . Hx of colonic polyps 03/31/2011  . Hyperlipidemia   . Neuromuscular disorder ([place])    hiatal hernia  . Sleep apnea    cpap    Past Surgical History:  Procedure Laterality Date  . APPENDECTOMY    . COLONOSCOPY    . HERNIA REPAIR Right    inguinal  . KNEE SURGERY    . POLYPECTOMY    . TONSILLECTOMY      FAMHx:  Family History  Problem Relation Age of Onset  . Heart murmur Mother   . Heart attack Father    . Stroke Sister   . Obesity Brother   . Colon cancer Neg Hx   . Colon polyps Neg Hx     SOCHx:   reports that he has quit smoking. He has quit using smokeless tobacco. He reports that he drinks alcohol. He reports that he does not use drugs.  ALLERGIES:  Allergies  Allergen Reactions  . Bee Venom   . Latex   . Other     Crab meat   . Penicillins     ROS: Pertinent items noted in HPI and remainder of comprehensive ROS otherwise negative.  HOME MEDS: Current Outpatient Medications on File Prior to Visit  Medication Sig Dispense Refill  . aspirin 81 MG chewable tablet Chew 81 mg by mouth daily.    . cholecalciferol (VITAMIN D) 1000 units tablet Take 5,000 Units by mouth daily.    . Multiple Vitamin (MULTIVITAMIN) tablet Take 1 tablet by mouth daily.    Marland Kitchen omeprazole (PRILOSEC) 20 MG capsule Take 20 mg by mouth daily.    Marland Kitchen PARoxetine (PAXIL) 20 MG tablet Take 20 mg by mouth daily.    . Potassium 99 MG TABS Take by mouth daily.    . simvastatin (ZOCOR) 80 MG tablet Take 40 mg by mouth daily at 6 PM.      No current facility-administered medications  on file prior to visit.     LABS/IMAGING: No results found for this or any previous visit (from the past 48 hour(s)). No results found.  LIPID PANEL: No results found for: Morris, TRIG, HDL, CHOLHDL, VLDL, LDLCALC, LDLDIRECT  WEIGHTS: Wt Readings from Last 3 Encounters:  03/08/17 225 lb 6.4 oz (102.2 kg)  06/01/16 220 lb (99.8 kg)  05/19/16 220 lb (99.8 kg)    VITALS: BP 136/72 (BP Location: Right Arm)   Pulse 77   Ht 5\' 10"  (1.778 m)   Wt 225 lb 6.4 oz (102.2 kg)   BMI 32.34 kg/m   EXAM: General appearance: alert and no distress Neck: no carotid bruit, no JVD and thyroid not enlarged, symmetric, no tenderness/mass/nodules Lungs: clear to auscultation bilaterally Heart: regular rate and rhythm, S1, S2 normal, no murmur, click, rub or gallop Abdomen: soft, non-tender; bowel sounds normal; no masses,  no  organomegaly Extremities: extremities normal, atraumatic, no cyanosis or edema Pulses: 2+ and symmetric Skin: Skin color, texture, turgor normal. No rashes or lesions Neurologic: Grossly normal Psych: Pleasant  EKG: Normal sinus rhythm at 77- personally reviewed  ASSESSMENT: 1. Coronary artery disease with calcification and aortic atherosclerosis 2. 4.3 cm ascending thoracic aortic aneurysm (01/2017) 3. 5.0 cm infrarenal abdominal aortic aneurysm (01/2017) 4. Dyslipidemia 5. Family history of heart disease 6. OSA on CPAP  PLAN: 1.   [name] has coronary artery disease with calcification aortic atherosclerosis.  He is describing some chest discomfort which he is felt has been reflux since he had similar symptoms over 25 years ago and had a negative heart catheterization at that time.  I did explain that it is likely he has had some progression of coronary artery disease since that time and could have new symptoms related to obstructive coronary disease.  In addition he is essentially meeting the criteria for treatment of the infrarenal abdominal aortic aneurysm.  As part of a preoperative workup I would recommend a Lexiscan Myoview to rule out any ischemia.  I will refer him to Dr. Trula Slade for evaluation and further treatment.  I can continue to follow his thoracic aortic aneurysm in our office.  In addition we will get a lipid profile and adjust medications accordingly do goal LDL less than 70.  I encouraged compliance with the CPAP which he says is less than optimal.  Follow-up with me afterwards.  Thanks again for the kind referral.  Pixie Casino, MD, FACC, [place] Director of the Advanced Lipid Disorders &  Cardiovascular Risk Reduction Clinic Diplomate of the American Board of Clinical Lipidology Attending Cardiologist  Direct Dial: [phone]  Fax: [phone]  Website:  [email or web address] 03/08/2017, 4:33 PM

## 2017-03-09 ENCOUNTER — Telehealth (HOSPITAL_COMMUNITY): Payer: Self-pay

## 2017-03-09 DIAGNOSIS — I2510 Atherosclerotic heart disease of native coronary artery without angina pectoris: Secondary | ICD-10-CM | POA: Diagnosis not present

## 2017-03-09 DIAGNOSIS — I2584 Coronary atherosclerosis due to calcified coronary lesion: Secondary | ICD-10-CM | POA: Diagnosis not present

## 2017-03-09 NOTE — Telephone Encounter (Signed)
Encounter complete. 

## 2017-03-10 ENCOUNTER — Ambulatory Visit (HOSPITAL_COMMUNITY)
Admission: RE | Admit: 2017-03-10 | Discharge: 2017-03-10 | Disposition: A | Discharge status: Home / Self Care | Payer: Medicare Other | Source: Ambulatory Visit | Attending: Cardiology | Admitting: Cardiology

## 2017-03-10 DIAGNOSIS — I712 Thoracic aortic aneurysm, without rupture, unspecified: Secondary | ICD-10-CM

## 2017-03-10 DIAGNOSIS — I714 Abdominal aortic aneurysm, without rupture, unspecified: Secondary | ICD-10-CM

## 2017-03-10 DIAGNOSIS — Z01810 Encounter for preprocedural cardiovascular examination: Secondary | ICD-10-CM | POA: Diagnosis not present

## 2017-03-10 DIAGNOSIS — I2510 Atherosclerotic heart disease of native coronary artery without angina pectoris: Secondary | ICD-10-CM | POA: Diagnosis not present

## 2017-03-10 DIAGNOSIS — I2584 Coronary atherosclerosis due to calcified coronary lesion: Secondary | ICD-10-CM | POA: Insufficient documentation

## 2017-03-10 LAB — LIPID PANEL
Chol/HDL Ratio: 4.8 ratio (ref 0.0–5.0)
Cholesterol, Total: 172 mg/dL (ref 100–199)
HDL: 36 mg/dL — AB (ref >39)
LDL Calculated: 100 mg/dL — ABNORMAL HIGH (ref 0–99)
TRIGLYCERIDES: 181 mg/dL — AB (ref 0–149)
VLDL Cholesterol Cal: 36 mg/dL (ref 5–40)

## 2017-03-10 LAB — MYOCARDIAL PERFUSION IMAGING
CHL CUP NUCLEAR SDS: 5
CHL CUP RESTING HR STRESS: 72 {beats}/min
CSEPPHR: 86 {beats}/min
LV dias vol: 94 mL (ref 62–150)
LV sys vol: 38 mL
SRS: 7
SSS: 12
TID: 1.07

## 2017-03-10 MED ORDER — REGADENOSON 0.4 MG/5ML IV SOLN
0.4000 mg | Freq: Once | INTRAVENOUS | Status: AC
  Administered 2017-03-10: 0.4 mg via INTRAVENOUS

## 2017-03-10 MED ORDER — TECHNETIUM TC 99M TETROFOSMIN IV KIT
32.2000 | PACK | Freq: Once | INTRAVENOUS | Status: AC | PRN
Start: 2017-03-10 — End: 2017-03-10
  Administered 2017-03-10: 32.2 via INTRAVENOUS
  Filled 2017-03-10: qty 33

## 2017-03-10 MED ORDER — TECHNETIUM TC 99M TETROFOSMIN IV KIT
11.0000 | PACK | Freq: Once | INTRAVENOUS | Status: AC | PRN
  Administered 2017-03-10: 11 via INTRAVENOUS
  Filled 2017-03-10: qty 11

## 2017-03-23 ENCOUNTER — Ambulatory Visit (INDEPENDENT_AMBULATORY_CARE_PROVIDER_SITE_OTHER): Payer: Medicare Other | Admitting: Internal Medicine

## 2017-03-23 ENCOUNTER — Encounter: Payer: Self-pay | Admitting: Internal Medicine

## 2017-03-23 VITALS — BP 129/76 | HR 81 | Ht 70.0 in | Wt 224.0 lb

## 2017-03-23 DIAGNOSIS — I2584 Coronary atherosclerosis due to calcified coronary lesion: Secondary | ICD-10-CM | POA: Diagnosis not present

## 2017-03-23 DIAGNOSIS — I712 Thoracic aortic aneurysm, without rupture, unspecified: Secondary | ICD-10-CM

## 2017-03-23 DIAGNOSIS — E785 Hyperlipidemia, unspecified: Secondary | ICD-10-CM

## 2017-03-23 DIAGNOSIS — I714 Abdominal aortic aneurysm, without rupture, unspecified: Secondary | ICD-10-CM

## 2017-03-23 DIAGNOSIS — I2510 Atherosclerotic heart disease of native coronary artery without angina pectoris: Secondary | ICD-10-CM

## 2017-03-23 MED ORDER — ROSUVASTATIN CALCIUM 40 MG PO TABS: 40.0000 mg | ORAL_TABLET | Freq: Every day | ORAL | 3 refills | Status: AC

## 2017-03-23 NOTE — Patient Instructions (Signed)
Your physician has recommended you make the following change in your medication:  -- STOP simvastatin  -- START rosuvastatin (crestor) 40mg  once daily  Your physician recommends that you return for lab work in [place] (fasting) to check cholesterol   Your physician wants you to follow-up in: [place] with Dr. Debara Pickett. You will receive a reminder letter in the mail two months in advance. If you don't receive a letter, please call our office to schedule the follow-up appointment.

## 2017-03-23 NOTE — Progress Notes (Signed)
OFFICE CONSULT NOTE  Chief Complaint:  Follow-up stress test, labs  Primary Care Physician: Patient, No Pcp Per  HPI:  Hector Morris is a 72 y.o. male who is being seen today for the evaluation of aneurysm at the request of No ref. provider found. This is a pleasant 72 year old male who lives in [place].  He had a recent fall and he was evaluated by CT scan.  The CT scan performed on 02/16/2017, demonstrated a broken left rib which was managed medically, but incidentally demonstrated a 4.3 cm ascending thoracic aortic aneurysm, atherosclerosis of the thoracic aorta and a 5 cm infrarenal abdominal aortic aneurysm.  Annual follow-up of the ascending aortic aneurysm was recommended and referral to vascular surgery based on ACR consensus guidelines.  In addition, Mr. Goens had a CT scan of the left shoulder in 2013 after a humeral fracture.  He was run over by a bull at that time.  CAT scan demonstrated coronary artery atherosclerosis, however he was not aware that he had that.  He does have a history of dyslipidemia and has been on simvastatin 40 mg nightly.  He reports remote cardiac catheterization in 1993 which showed no significant coronary disease.  He says he has had long-standing reflux symptoms although recently has been having some chest discomfort.  Most of the time and seems to improve with Tums or reflux medication and he has a known hiatal hernia.  03/23/2017  Mr. Kerstetter was seen today in follow-up.  He underwent a nuclear stress test which was negative for ischemia.  He had normal LV function.  He had a lipid profile which showed a total cholesterol 172, triglycerides 181, HDL 36, LDL 100.  He is on simvastatin however based on his higher dose cannot have an up titration based on FDA limitations.  Goal LDL is less than 70.  PMHx:  Past Medical History:  Diagnosis Date  . Allergy   . Anxiety   . Arthritis   . GERD (gastroesophageal reflux disease)   . Hx of colonic  polyps 03/31/2011  . Hyperlipidemia   . Neuromuscular disorder ([place])    hiatal hernia  . Sleep apnea    cpap    Past Surgical History:  Procedure Laterality Date  . APPENDECTOMY    . COLONOSCOPY    . HERNIA REPAIR Right    inguinal  . KNEE SURGERY    . POLYPECTOMY    . TONSILLECTOMY      FAMHx:  Family History  Problem Relation Age of Onset  . Heart murmur Mother   . Heart attack Father   . Stroke Sister   . Obesity Brother   . Colon cancer Neg Hx   . Colon polyps Neg Hx     SOCHx:   reports that he has quit smoking. He has quit using smokeless tobacco. He reports that he drinks alcohol. He reports that he does not use drugs.  ALLERGIES:  Allergies  Allergen Reactions  . Bee Venom   . Latex   . Other     Crab meat   . Penicillins     ROS: Pertinent items noted in HPI and remainder of comprehensive ROS otherwise negative.  HOME MEDS: Current Outpatient Medications on File Prior to Visit  Medication Sig Dispense Refill  . aspirin 81 MG chewable tablet Chew 81 mg by mouth daily.    Marland Kitchen omeprazole (PRILOSEC) 20 MG capsule Take 20 mg by mouth daily.    Marland Kitchen PARoxetine (PAXIL) 20 MG  tablet Take 20 mg by mouth daily.    . simvastatin (ZOCOR) 80 MG tablet Take 40 mg by mouth daily at 6 PM.      No current facility-administered medications on file prior to visit.     LABS/IMAGING: No results found for this or any previous visit (from the past 48 hour(s)). No results found.  LIPID PANEL:    Component Value Date/Time   CHOL 172 03/09/2017 0939   TRIG 181 (H) 03/09/2017 0939   HDL 36 (L) 03/09/2017 0939   CHOLHDL 4.8 03/09/2017 0939   LDLCALC 100 (H) 03/09/2017 0939    WEIGHTS: Wt Readings from Last 3 Encounters:  03/23/17 224 lb (101.6 kg)  03/08/17 225 lb 6.4 oz (102.2 kg)  06/01/16 220 lb (99.8 kg)    VITALS: BP 129/76   Pulse 81   Ht 5\' 10"  (1.778 m)   Wt 224 lb (101.6 kg)   SpO2 95%   BMI 32.14 kg/m    EXAM: Deferred  EKG: Deferred  ASSESSMENT: 1. Coronary artery disease with calcification and aortic atherosclerosis 2. 4.3 cm ascending thoracic aortic aneurysm (01/2017) 3. 5.0 cm infrarenal abdominal aortic aneurysm (01/2017) 4. Dyslipidemia 5. Family history of heart disease 6. OSA on CPAP  PLAN: 1.   [name] has an appointment with Dr. Trula Slade in about 3 weeks.  His nuclear stress test was negative for ischemia.  His LDL cholesterol is not at goal and we will switch him to rosuvastatin.  We will repeat a lipid profile in about 3 months.  Follow-up with me in 6 months.  Pixie Casino, MD, [facility], [place] Director of the Advanced Lipid Disorders &  Cardiovascular Risk Reduction Clinic Diplomate of the American Board of Clinical Lipidology Attending Cardiologist  Direct Dial: [phone]  Fax: [phone]  Website:  [email or web address] 03/23/2017, 1:31 PM

## 2017-03-24 ENCOUNTER — Other Ambulatory Visit: Payer: Self-pay

## 2017-04-12 ENCOUNTER — Encounter: Payer: Self-pay | Admitting: Surgery

## 2017-04-12 ENCOUNTER — Other Ambulatory Visit: Payer: Self-pay

## 2017-04-12 ENCOUNTER — Encounter: Payer: Self-pay | Admitting: *Deleted

## 2017-04-12 ENCOUNTER — Ambulatory Visit (INDEPENDENT_AMBULATORY_CARE_PROVIDER_SITE_OTHER)
Admission: RE | Admit: 2017-04-12 | Discharge: 2017-04-12 | Disposition: A | Discharge status: Home / Self Care | Payer: Medicare Other | Source: Ambulatory Visit | Attending: Surgery | Admitting: Surgery

## 2017-04-12 ENCOUNTER — Ambulatory Visit (HOSPITAL_COMMUNITY)
Admission: RE | Admit: 2017-04-12 | Discharge: 2017-04-12 | Disposition: A | Discharge status: Home / Self Care | Payer: Medicare Other | Source: Ambulatory Visit | Attending: Surgery | Admitting: Surgery

## 2017-04-12 ENCOUNTER — Ambulatory Visit (INDEPENDENT_AMBULATORY_CARE_PROVIDER_SITE_OTHER): Payer: Medicare Other | Admitting: Surgery

## 2017-04-12 VITALS — BP 115/78 | HR 80 | Temp 99.2°F | Resp 20 | Ht 70.0 in | Wt 225.0 lb

## 2017-04-12 DIAGNOSIS — Z01810 Encounter for preprocedural cardiovascular examination: Secondary | ICD-10-CM | POA: Diagnosis not present

## 2017-04-12 DIAGNOSIS — I724 Aneurysm of artery of lower extremity: Secondary | ICD-10-CM

## 2017-04-12 DIAGNOSIS — I6529 Occlusion and stenosis of unspecified carotid artery: Secondary | ICD-10-CM | POA: Diagnosis not present

## 2017-04-12 DIAGNOSIS — I714 Abdominal aortic aneurysm, without rupture, unspecified: Secondary | ICD-10-CM

## 2017-04-12 LAB — VAS US CAROTID
LCCAPDIAS: 18 cm/s
LEFT ECA DIAS: 18 cm/s
LICADDIAS: -38 cm/s
LICADSYS: -111 cm/s
LICAPDIAS: -30 cm/s
LICAPSYS: -92 cm/s
Left CCA dist dias: 15 cm/s
Left CCA dist sys: 49 cm/s
Left CCA prox sys: 77 cm/s
RCCAPDIAS: 11 cm/s
RIGHT CCA MID DIAS: 16 cm/s
RIGHT ECA DIAS: -8 cm/s
Right CCA prox sys: 69 cm/s
Right cca dist sys: -134 cm/s

## 2017-04-12 NOTE — H&P (View-Only) (Signed)
Vascular and Vein Specialist of [facility]  Patient name: Hector Morris MRN: [id number] DOB: [birth year] Sex: male   REQUESTING PROVIDER:    Dr. Debara Pickett   REASON FOR CONSULT:    AAA  HISTORY OF PRESENT ILLNESS:   Hector Morris is a 72 y.o. male, who is referred today for evaluation of an abdominal aortic aneurysm.  This was an incidental finding on a CT scan following a fall.  Maximum aortic diameter in the infrarenal aorta was 5.0 cm.  He has a 4.3 cm ascending aortic aneurysm.  The patient suffers from hypercholesterolemia, for which he takes a statin.  He is a former smoker.  He does have a family history of vascular disease.  He is very eager to get his aneurysm fixed.  PAST MEDICAL HISTORY    Past Medical History:  Diagnosis Date  . Allergy   . Anxiety   . Arthritis   . GERD (gastroesophageal reflux disease)   . Hx of colonic polyps 03/31/2011  . Hyperlipidemia   . Neuromuscular disorder ([place])    hiatal hernia  . Sleep apnea    cpap     FAMILY HISTORY   Family History  Problem Relation Age of Onset  . Heart murmur Mother   . Heart attack Father   . Stroke Sister   . Obesity Brother   . Colon cancer Neg Hx   . Colon polyps Neg Hx     SOCIAL HISTORY:   Social History   Socioeconomic History  . Marital status: Married    Spouse name: Not on file  . Number of children: Not on file  . Years of education: Not on file  . Highest education level: Not on file  Social Needs  . Financial resource strain: Not on file  . Food insecurity - worry: Not on file  . Food insecurity - inability: Not on file  . Transportation needs - medical: Not on file  . Transportation needs - non-medical: Not on file  Occupational History  . Not on file  Tobacco Use  . Smoking status: Former Research scientist (life sciences)  . Smokeless tobacco: Former Network engineer and Sexual Activity  . Alcohol use: Yes    Comment: occasional  . Drug use: No  . Sexual activity:  Not on file  Other Topics Concern  . Not on file  Social History Narrative  . Not on file    ALLERGIES:    Allergies  Allergen Reactions  . Bee Venom   . Latex   . Other     Crab meat   . Penicillins     CURRENT MEDICATIONS:    Current Outpatient Medications  Medication Sig Dispense Refill  . aspirin 81 MG chewable tablet Chew 81 mg by mouth daily.    Marland Kitchen omeprazole (PRILOSEC) 20 MG capsule Take 20 mg by mouth daily.    Marland Kitchen PARoxetine (PAXIL) 20 MG tablet Take 20 mg by mouth daily.    . rosuvastatin (CRESTOR) 40 MG tablet Take 1 tablet (40 mg total) by mouth daily. 90 tablet 3   No current facility-administered medications for this visit.     REVIEW OF SYSTEMS:   [X]  denotes positive finding, [ ]  denotes negative finding Cardiac  Comments:  Chest pain or chest pressure:    Shortness of breath upon exertion:    Short of breath when lying flat:    Irregular heart rhythm:        Vascular    Pain in calf, thigh, or  hip brought on by ambulation:    Pain in feet at night that wakes you up from your sleep:     Blood clot in your veins:    Leg swelling:         Pulmonary    Oxygen at home:    Productive cough:     Wheezing:         Neurologic    Sudden weakness in arms or legs:     Sudden numbness in arms or legs:     Sudden onset of difficulty speaking or slurred speech:    Temporary loss of vision in one eye:     Problems with dizziness:         Gastrointestinal    Blood in stool:      Vomited blood:         Genitourinary    Burning when urinating:     Blood in urine:        Psychiatric    Major depression:         Hematologic    Bleeding problems:    Problems with blood clotting too easily:        Skin    Rashes or ulcers:        Constitutional    Fever or chills:     PHYSICAL EXAM:   Vitals:   04/12/17 1057  BP: 115/78  Pulse: 80  Resp: 20  Temp: 99.2 F (37.3 C)  TempSrc: Oral  SpO2: 96%  Weight: 225 lb (102.1 kg)  Height: 5\' 10"   (1.778 m)    GENERAL: The patient is a well-nourished male, in no acute distress. The vital signs are documented above. CARDIAC: There is a regular rate and rhythm.  VASCULAR: Nonpalpable pedal pulses.  No carotid bruits. PULMONARY: Nonlabored respirations ABDOMEN: Soft and non-tender MUSCULOSKELETAL: There are no major deformities or cyanosis. NEUROLOGIC: No focal weakness or paresthesias are detected. SKIN: There are no ulcers or rashes noted. PSYCHIATRIC: The patient has a normal affect.  STUDIES:   Low risk Myoview  CT angiogram: 5 cm infrarenal abdominal aortic aneurysm  Carotid duplex: 1-39% bilateral  ABI: 1.0 B with triphasic waveforms  No popliteal aneurysms  ASSESSMENT and PLAN   AAA: I reviewed the CT scan with the patient and his son.  We discussed options for repair.  We have decided to proceed with endovascular aneurysm repair.  I discussed the risks and benefits of the operation including the risk of stroke, bleeding, lower extremity vascular issues, intestinal ischemia renal failure, bleeding, death.  All of his questions were answered.  He wants to get this done as soon as possible.  I have placed him on the schedule for Wednesday, February 27.   Annamarie Major, MD Vascular and Vein Specialists of [facility] [phone] Pager [phone]

## 2017-04-12 NOTE — Progress Notes (Signed)
Vascular and Vein Specialist of [facility]  Patient name: Hector Morris MRN: [id number] DOB: Jul 14, 1945 Sex: male   REQUESTING PROVIDER:    Dr. Debara Pickett   REASON FOR CONSULT:    AAA  HISTORY OF PRESENT ILLNESS:   Hector Morris is a 72 y.o. male, who is referred today for evaluation of an abdominal aortic aneurysm.  This was an incidental finding on a CT scan following a fall.  Maximum aortic diameter in the infrarenal aorta was 5.0 cm.  He has a 4.3 cm ascending aortic aneurysm.  The patient suffers from hypercholesterolemia, for which he takes a statin.  He is a former smoker.  He does have a family history of vascular disease.  He is very eager to get his aneurysm fixed.  PAST MEDICAL HISTORY    Past Medical History:  Diagnosis Date  . Allergy   . Anxiety   . Arthritis   . GERD (gastroesophageal reflux disease)   . Hx of colonic polyps 03/31/2011  . Hyperlipidemia   . Neuromuscular disorder ([place])    hiatal hernia  . Sleep apnea    cpap     FAMILY HISTORY   Family History  Problem Relation Age of Onset  . Heart murmur Mother   . Heart attack Father   . Stroke Sister   . Obesity Brother   . Colon cancer Neg Hx   . Colon polyps Neg Hx     SOCIAL HISTORY:   Social History   Socioeconomic History  . Marital status: Married    Spouse name: Not on file  . Number of children: Not on file  . Years of education: Not on file  . Highest education level: Not on file  Social Needs  . Financial resource strain: Not on file  . Food insecurity - worry: Not on file  . Food insecurity - inability: Not on file  . Transportation needs - medical: Not on file  . Transportation needs - non-medical: Not on file  Occupational History  . Not on file  Tobacco Use  . Smoking status: Former Research scientist (life sciences)  . Smokeless tobacco: Former Network engineer and Sexual Activity  . Alcohol use: Yes    Comment: occasional  . Drug use: No  . Sexual activity:  Not on file  Other Topics Concern  . Not on file  Social History Narrative  . Not on file    ALLERGIES:    Allergies  Allergen Reactions  . Bee Venom   . Latex   . Other     Crab meat   . Penicillins     CURRENT MEDICATIONS:    Current Outpatient Medications  Medication Sig Dispense Refill  . aspirin 81 MG chewable tablet Chew 81 mg by mouth daily.    Marland Kitchen omeprazole (PRILOSEC) 20 MG capsule Take 20 mg by mouth daily.    Marland Kitchen PARoxetine (PAXIL) 20 MG tablet Take 20 mg by mouth daily.    . rosuvastatin (CRESTOR) 40 MG tablet Take 1 tablet (40 mg total) by mouth daily. 90 tablet 3   No current facility-administered medications for this visit.     REVIEW OF SYSTEMS:   [X]  denotes positive finding, [ ]  denotes negative finding Cardiac  Comments:  Chest pain or chest pressure:    Shortness of breath upon exertion:    Short of breath when lying flat:    Irregular heart rhythm:        Vascular    Pain in calf, thigh, or  hip brought on by ambulation:    Pain in feet at night that wakes you up from your sleep:     Blood clot in your veins:    Leg swelling:         Pulmonary    Oxygen at home:    Productive cough:     Wheezing:         Neurologic    Sudden weakness in arms or legs:     Sudden numbness in arms or legs:     Sudden onset of difficulty speaking or slurred speech:    Temporary loss of vision in one eye:     Problems with dizziness:         Gastrointestinal    Blood in stool:      Vomited blood:         Genitourinary    Burning when urinating:     Blood in urine:        Psychiatric    Major depression:         Hematologic    Bleeding problems:    Problems with blood clotting too easily:        Skin    Rashes or ulcers:        Constitutional    Fever or chills:     PHYSICAL EXAM:   Vitals:   04/12/17 1057  BP: 115/78  Pulse: 80  Resp: 20  Temp: 99.2 F (37.3 C)  TempSrc: Oral  SpO2: 96%  Weight: 225 lb (102.1 kg)  Height: 5\' 10"   (1.778 m)    GENERAL: The patient is a well-nourished male, in no acute distress. The vital signs are documented above. CARDIAC: There is a regular rate and rhythm.  VASCULAR: Nonpalpable pedal pulses.  No carotid bruits. PULMONARY: Nonlabored respirations ABDOMEN: Soft and non-tender MUSCULOSKELETAL: There are no major deformities or cyanosis. NEUROLOGIC: No focal weakness or paresthesias are detected. SKIN: There are no ulcers or rashes noted. PSYCHIATRIC: The patient has a normal affect.  STUDIES:   Low risk Myoview  CT angiogram: 5 cm infrarenal abdominal aortic aneurysm  Carotid duplex: 1-39% bilateral  ABI: 1.0 B with triphasic waveforms  No popliteal aneurysms  ASSESSMENT and PLAN   AAA: I reviewed the CT scan with the patient and his son.  We discussed options for repair.  We have decided to proceed with endovascular aneurysm repair.  I discussed the risks and benefits of the operation including the risk of stroke, bleeding, lower extremity vascular issues, intestinal ischemia renal failure, bleeding, death.  All of his questions were answered.  He wants to get this done as soon as possible.  I have placed him on the schedule for Wednesday, February 27.   Annamarie Major, MD Vascular and Vein Specialists of [facility] [phone] Pager [phone]

## 2017-04-13 ENCOUNTER — Other Ambulatory Visit: Payer: Self-pay | Admitting: *Deleted

## 2017-04-16 ENCOUNTER — Other Ambulatory Visit (HOSPITAL_COMMUNITY): Payer: Medicare Other

## 2017-04-19 NOTE — Pre-Procedure Instructions (Signed)
Hector Morris  04/19/2017      [facility], [place] [place] [address] Phone: [phone] Fax: [phone]    Your procedure is scheduled on February 27  Report to [place] at [place].M.  Call this number if you have problems the morning of surgery:  [phone]   Remember:  Do not eat food or drink liquids after midnight.  Continue all medications as directed by your physician except follow these medication instructions before surgery below   Take these medicines the morning of surgery with A SIP OF WATER  omeprazole (PRILOSEC) PARoxetine (PAXIL)  7 days prior to surgery STOP taking any Aspirin(unless otherwise instructed by your surgeon), Aleve, Naproxen, Ibuprofen, Motrin, Advil, Goody's, BC's, all herbal medications, fish oil, and all vitamins  Follow your doctors instructions regarding your Aspirin.  If no instructions were given by your doctor, then you will need to call the prescribing office office to get instructions.     Do not wear jewelry  Do not wear lotions, powders, or cologne, or deodorant.  Men may shave face and neck.  Do not bring valuables to the hospital.  [facility] is not responsible for any belongings or valuables.  Contacts, dentures or bridgework may not be worn into surgery.  Leave your suitcase in the car.  After surgery it may be brought to your room.  For patients admitted to the hospital, discharge time will be determined by your treatment team.  Patients discharged the day of surgery will not be allowed to drive home.    Special instructions:   [place]- Preparing For Surgery  Before surgery, you can play an important role. Because skin is not sterile, your skin needs to be as free of germs as possible. You can reduce the number of germs on your skin by washing with CHG (chlorahexidine gluconate) Soap before surgery.  CHG is an antiseptic cleaner which  kills germs and bonds with the skin to continue killing germs even after washing.  Please do not use if you have an allergy to CHG or antibacterial soaps. If your skin becomes reddened/irritated stop using the CHG.  Do not shave (including legs and underarms) for at least 48 hours prior to first CHG shower. It is OK to shave your face.  Please follow these instructions carefully.   1. Shower the NIGHT BEFORE SURGERY and the MORNING OF SURGERY with CHG.   2. If you chose to wash your hair, wash your hair first as usual with your normal shampoo.  3. After you shampoo, rinse your hair and body thoroughly to remove the shampoo.  4. Use CHG as you would any other liquid soap. You can apply CHG directly to the skin and wash gently with a scrungie or a clean washcloth.   5. Apply the CHG Soap to your body ONLY FROM THE NECK DOWN.  Do not use on open wounds or open sores. Avoid contact with your eyes, ears, mouth and genitals (private parts). Wash Face and genitals (private parts)  with your normal soap.  6. Wash thoroughly, paying special attention to the area where your surgery will be performed.  7. Thoroughly rinse your body with warm water from the neck down.  8. DO NOT shower/wash with your normal soap after using and rinsing off the CHG Soap.  9. Pat yourself dry with a CLEAN TOWEL.  10. Wear CLEAN PAJAMAS to bed the  night before surgery, wear comfortable clothes the morning of surgery  11. Place CLEAN SHEETS on your bed the night of your first shower and DO NOT SLEEP WITH PETS.    Day of Surgery: Do not apply any deodorants/lotions. Please wear clean clothes to the hospital/surgery center.      Please read over the following fact sheets that you were given.

## 2017-04-20 ENCOUNTER — Other Ambulatory Visit: Payer: Self-pay

## 2017-04-20 ENCOUNTER — Encounter (HOSPITAL_COMMUNITY): Payer: Self-pay

## 2017-04-20 ENCOUNTER — Encounter (HOSPITAL_COMMUNITY)
Admission: RE | Admit: 2017-04-20 | Discharge: 2017-04-20 | Disposition: A | Discharge status: Home / Self Care | Payer: Medicare Other | Source: Ambulatory Visit | Attending: Surgery | Admitting: Surgery

## 2017-04-20 HISTORY — DX: Personal history of urinary calculi: Z87.442

## 2017-04-20 HISTORY — DX: Dyspnea, unspecified: R06.00

## 2017-04-20 HISTORY — DX: Personal history of other diseases of the digestive system: Z87.19

## 2017-04-20 LAB — PROTIME-INR
INR: 1.06
Prothrombin Time: 13.7 seconds (ref 11.4–15.2)

## 2017-04-20 LAB — CBC
HCT: 42 % (ref 39.0–52.0)
HEMOGLOBIN: 14.2 g/dL (ref 13.0–17.0)
MCH: 30.8 pg (ref 26.0–34.0)
MCHC: 33.8 g/dL (ref 30.0–36.0)
MCV: 91.1 fL (ref 78.0–100.0)
Platelets: 320 10*3/uL (ref 150–400)
RBC: 4.61 MIL/uL (ref 4.22–5.81)
RDW: 12.5 % (ref 11.5–15.5)
WBC: 9.1 10*3/uL (ref 4.0–10.5)

## 2017-04-20 LAB — COMPREHENSIVE METABOLIC PANEL
ALBUMIN: 3.7 g/dL (ref 3.5–5.0)
ALK PHOS: 62 U/L (ref 38–126)
ALT: 22 U/L (ref 17–63)
AST: 25 U/L (ref 15–41)
Anion gap: 9 (ref 5–15)
BUN: 17 mg/dL (ref 6–20)
CALCIUM: 9.6 mg/dL (ref 8.9–10.3)
CO2: 25 mmol/L (ref 22–32)
CREATININE: 1.24 mg/dL (ref 0.61–1.24)
Chloride: 103 mmol/L (ref 101–111)
GFR calc non Af Amer: 56 mL/min — ABNORMAL LOW (ref >60)
GLUCOSE: 89 mg/dL (ref 65–99)
Potassium: 4.2 mmol/L (ref 3.5–5.1)
SODIUM: 137 mmol/L (ref 135–145)
Total Bilirubin: 0.6 mg/dL (ref 0.3–1.2)
Total Protein: 7.1 g/dL (ref 6.5–8.1)

## 2017-04-20 LAB — SURGICAL PCR SCREEN
MRSA, PCR: NEGATIVE
STAPHYLOCOCCUS AUREUS: NEGATIVE

## 2017-04-20 LAB — TYPE AND SCREEN
ABO/RH(D): O POS
Antibody Screen: NEGATIVE

## 2017-04-20 LAB — URINALYSIS, ROUTINE W REFLEX MICROSCOPIC
Bilirubin Urine: NEGATIVE
Glucose, UA: NEGATIVE mg/dL
Hgb urine dipstick: NEGATIVE
KETONES UR: NEGATIVE mg/dL
LEUKOCYTES UA: NEGATIVE
Nitrite: NEGATIVE
PROTEIN: NEGATIVE mg/dL
Specific Gravity, Urine: 1.019 (ref 1.005–1.030)
pH: 5 (ref 5.0–8.0)

## 2017-04-20 LAB — APTT: aPTT: 31 seconds (ref 24–36)

## 2017-04-20 LAB — ABO/RH: ABO/RH(D): O POS

## 2017-04-20 MED ORDER — SODIUM CHLORIDE 0.9 % IV SOLN
1500.0000 mg | INTRAVENOUS | Status: AC
  Administered 2017-04-21: 1500 mg via INTRAVENOUS
  Filled 2017-04-20: qty 1500

## 2017-04-20 NOTE — Pre-Procedure Instructions (Signed)
Ibrahem Fatima  04/20/2017      [facility], [place] [address] [place] [address] Phone: [phone] Fax: [phone]    Your procedure is scheduled on 04-21-2017 Wednesday   Report to [facility] Admitting at 9:45 A.M.   Call this number if you have problems the morning of surgery:  [phone]   Remember:  Do not eat food or drink liquids after midnight.   Take these medicines the morning of surgery with A SIP OF WATER  Omeprazole(Prilosec) Paroxetine(Paxil) Rouvastatin(Crestor)  STOP TAKING ANY ASPIRIN(UNLESS OTHERWISE INSTRUCTED BY YOUR SURGEON),ANTIINFLAMATORIES (IBUPROFEN,ALEVE,MOTRIN,ADVIL,GOODY'S POWDERS),HERBAL SUPPLEMENTS,FISH OIL,AND VITAMINS 5-7 DAYS PRIOR TO SURGERY   Do not wear jewelry,  .  Do not wear lotions, powders, or perfumes, or deodorant.  Do not shave 48 hours prior to surgery.  Men may shave face and neck.   Do not bring valuables to the hospital .  [facility] is not responsible for any belongings or valuables.  Contacts, dentures or bridgework may not be worn into surgery.  Leave your suitcase in the car.  After surgery it may be brought to your room.  For patients admitted to the hospital, discharge time will be determined by your treatment team.  Patients discharged the day of surgery will not be allowed to drive home.    Special Instructions: [place] - Preparing for Surgery  Before surgery, you can play an important role.  Because skin is not sterile, your skin needs to be as free of germs as possible.  You can reduce the number of germs on you skin by washing with CHG (chlorahexidine gluconate) soap before surgery.  CHG is an antiseptic cleaner which kills germs and bonds with the skin to continue killing germs even after washing.  Please DO NOT use if you have an allergy to CHG or antibacterial soaps.  If your skin becomes reddened/irritated stop using the CHG and inform  your nurse when you arrive at Short Stay.  Do not shave (including legs and underarms) for at least 48 hours prior to the first CHG shower.  You may shave your face.  Please follow these instructions carefully:   1.  Shower with CHG Soap the night before surgery and the   morning of Surgery.  2.  If you choose to wash your hair, wash your hair first as usual with your normal shampoo.  3.  After you shampoo, rinse your hair and body thoroughly to remove the  Shampoo.  4.  Use CHG as you would any other liquid soap.  You can apply chg directly  to the skin and wash gently with scrungie or a clean washcloth.  5.  Apply the CHG Soap to your body ONLY FROM THE NECK DOWN.   Do not use on open wounds or open sores.  Avoid contact with your eyes,  ears, mouth and genitals (private parts).  Wash genitals (private parts) with your normal soap.  6.  Wash thoroughly, paying special attention to the area where your surgery will be performed.  7.  Thoroughly rinse your body with warm water from the neck down.  8.  DO NOT shower/wash with your normal soap after using and rinsing o  the CHG Soap.  9.  Pat yourself dry with a clean towel.            10.  Wear clean pajamas.  11.  Place clean sheets on your bed the night of your first shower and do not sleep with pets.  Day of Surgery  Do not apply any lotions/deodorants the morning of surgery.  Please wear clean clothes to the hospital/surgery center.   Please read over the following fact sheets that you were given. Pain Booklet, Coughing and Deep Breathing, MRSA Information and Surgical Site Infection Prevention

## 2017-04-21 ENCOUNTER — Inpatient Hospital Stay (HOSPITAL_COMMUNITY): Payer: Medicare Other | Admitting: Certified Registered Nurse Anesthetist

## 2017-04-21 ENCOUNTER — Inpatient Hospital Stay (HOSPITAL_COMMUNITY)
Admission: RE | Admit: 2017-04-21 | Discharge: 2017-04-22 | DRG: 269 | Disposition: A | Discharge status: Home / Self Care | Payer: Medicare Other | Source: Ambulatory Visit | Attending: Surgery | Admitting: Surgery

## 2017-04-21 ENCOUNTER — Encounter (HOSPITAL_COMMUNITY): Payer: Self-pay | Admitting: *Deleted

## 2017-04-21 ENCOUNTER — Inpatient Hospital Stay (HOSPITAL_COMMUNITY): Payer: Medicare Other

## 2017-04-21 ENCOUNTER — Encounter (HOSPITAL_COMMUNITY)
Admission: RE | Disposition: A | Discharge status: Home / Self Care | Payer: Self-pay | Source: Ambulatory Visit | Attending: Surgery

## 2017-04-21 DIAGNOSIS — J811 Chronic pulmonary edema: Secondary | ICD-10-CM | POA: Diagnosis not present

## 2017-04-21 DIAGNOSIS — Z8249 Family history of ischemic heart disease and other diseases of the circulatory system: Secondary | ICD-10-CM

## 2017-04-21 DIAGNOSIS — Z91040 Latex allergy status: Secondary | ICD-10-CM

## 2017-04-21 DIAGNOSIS — Z95828 Presence of other vascular implants and grafts: Secondary | ICD-10-CM

## 2017-04-21 DIAGNOSIS — Z91030 Bee allergy status: Secondary | ICD-10-CM

## 2017-04-21 DIAGNOSIS — I714 Abdominal aortic aneurysm, without rupture: Secondary | ICD-10-CM | POA: Diagnosis not present

## 2017-04-21 DIAGNOSIS — Z86010 Personal history of colonic polyps: Secondary | ICD-10-CM | POA: Diagnosis not present

## 2017-04-21 DIAGNOSIS — Z880 Allergy status to penicillin: Secondary | ICD-10-CM | POA: Diagnosis not present

## 2017-04-21 DIAGNOSIS — Z8679 Personal history of other diseases of the circulatory system: Secondary | ICD-10-CM

## 2017-04-21 DIAGNOSIS — I712 Thoracic aortic aneurysm, without rupture: Secondary | ICD-10-CM | POA: Diagnosis not present

## 2017-04-21 DIAGNOSIS — Z7982 Long term (current) use of aspirin: Secondary | ICD-10-CM | POA: Diagnosis not present

## 2017-04-21 DIAGNOSIS — G4730 Sleep apnea, unspecified: Secondary | ICD-10-CM | POA: Diagnosis present

## 2017-04-21 DIAGNOSIS — E785 Hyperlipidemia, unspecified: Secondary | ICD-10-CM | POA: Diagnosis present

## 2017-04-21 DIAGNOSIS — Z823 Family history of stroke: Secondary | ICD-10-CM | POA: Diagnosis not present

## 2017-04-21 DIAGNOSIS — M1990 Unspecified osteoarthritis, unspecified site: Secondary | ICD-10-CM | POA: Diagnosis present

## 2017-04-21 DIAGNOSIS — K449 Diaphragmatic hernia without obstruction or gangrene: Secondary | ICD-10-CM | POA: Diagnosis present

## 2017-04-21 DIAGNOSIS — E876 Hypokalemia: Secondary | ICD-10-CM | POA: Diagnosis not present

## 2017-04-21 DIAGNOSIS — E7800 Pure hypercholesterolemia, unspecified: Secondary | ICD-10-CM | POA: Diagnosis present

## 2017-04-21 DIAGNOSIS — Z87891 Personal history of nicotine dependence: Secondary | ICD-10-CM | POA: Diagnosis not present

## 2017-04-21 DIAGNOSIS — K219 Gastro-esophageal reflux disease without esophagitis: Secondary | ICD-10-CM | POA: Diagnosis present

## 2017-04-21 HISTORY — PX: ABDOMINAL AORTIC ENDOVASCULAR STENT GRAFT: SHX5707

## 2017-04-21 LAB — CBC
HEMATOCRIT: 36.6 % — AB (ref 39.0–52.0)
HEMOGLOBIN: 12.5 g/dL — AB (ref 13.0–17.0)
MCH: 30.7 pg (ref 26.0–34.0)
MCHC: 34.2 g/dL (ref 30.0–36.0)
MCV: 89.9 fL (ref 78.0–100.0)
PLATELETS: 275 10*3/uL (ref 150–400)
RBC: 4.07 MIL/uL — AB (ref 4.22–5.81)
RDW: 12.5 % (ref 11.5–15.5)
WBC: 8.4 10*3/uL (ref 4.0–10.5)

## 2017-04-21 LAB — BASIC METABOLIC PANEL
ANION GAP: 11 (ref 5–15)
BUN: 15 mg/dL (ref 6–20)
CHLORIDE: 108 mmol/L (ref 101–111)
CO2: 21 mmol/L — AB (ref 22–32)
Calcium: 8.9 mg/dL (ref 8.9–10.3)
Creatinine, Ser: 1.11 mg/dL (ref 0.61–1.24)
GFR calc Af Amer: >60 mL/min (ref >60)
GFR calc non Af Amer: >60 mL/min (ref >60)
GLUCOSE: 100 mg/dL — AB (ref 65–99)
Potassium: 3.7 mmol/L (ref 3.5–5.1)
Sodium: 140 mmol/L (ref 135–145)

## 2017-04-21 LAB — APTT

## 2017-04-21 LAB — PROTIME-INR
INR: 1.24
Prothrombin Time: 15.5 seconds — ABNORMAL HIGH (ref 11.4–15.2)

## 2017-04-21 LAB — POCT ACTIVATED CLOTTING TIME
Activated Clotting Time: 213 seconds
Activated Clotting Time: 213 seconds

## 2017-04-21 SURGERY — INSERTION, ENDOVASCULAR STENT GRAFT, AORTA, ABDOMINAL
Anesthesia: General

## 2017-04-21 MED ORDER — METOCLOPRAMIDE HCL 10 MG PO TABS: 10.0000 mg | ORAL_TABLET | Freq: Every day | ORAL | Status: DC | PRN

## 2017-04-21 MED ORDER — HEPARIN SODIUM (PORCINE) 1000 UNIT/ML IJ SOLN
INTRAMUSCULAR | Status: AC
  Filled 2017-04-21: qty 1

## 2017-04-21 MED ORDER — PROPOFOL 10 MG/ML IV BOLUS
INTRAVENOUS | Status: DC | PRN
  Administered 2017-04-21: 120 mg via INTRAVENOUS

## 2017-04-21 MED ORDER — VANCOMYCIN HCL IN DEXTROSE 1-5 GM/200ML-% IV SOLN
1000.0000 mg | Freq: Two times a day (BID) | INTRAVENOUS | Status: AC
  Administered 2017-04-21 – 2017-04-22 (×2): 1000 mg via INTRAVENOUS
  Filled 2017-04-21 (×2): qty 200

## 2017-04-21 MED ORDER — HYDRALAZINE HCL 20 MG/ML IJ SOLN: 5.0000 mg | INTRAMUSCULAR | Status: DC | PRN

## 2017-04-21 MED ORDER — PROTAMINE SULFATE 10 MG/ML IV SOLN
INTRAVENOUS | Status: AC
  Filled 2017-04-21: qty 5

## 2017-04-21 MED ORDER — HEPARIN SODIUM (PORCINE) 5000 UNIT/ML IJ SOLN
INTRAMUSCULAR | Status: DC | PRN
  Administered 2017-04-21: 15:00:00

## 2017-04-21 MED ORDER — SUCCINYLCHOLINE CHLORIDE 20 MG/ML IJ SOLN
INTRAMUSCULAR | Status: DC | PRN
  Administered 2017-04-21: 140 mg via INTRAVENOUS

## 2017-04-21 MED ORDER — CHLORHEXIDINE GLUCONATE 4 % EX LIQD: 60.0000 mL | Freq: Once | CUTANEOUS | Status: DC

## 2017-04-21 MED ORDER — FENTANYL CITRATE (PF) 100 MCG/2ML IJ SOLN
INTRAMUSCULAR | Status: AC
  Filled 2017-04-21: qty 2

## 2017-04-21 MED ORDER — METOPROLOL TARTRATE 5 MG/5ML IV SOLN: 2.0000 mg | INTRAVENOUS | Status: DC | PRN

## 2017-04-21 MED ORDER — PHENYLEPHRINE 40 MCG/ML (10ML) SYRINGE FOR IV PUSH (FOR BLOOD PRESSURE SUPPORT)
PREFILLED_SYRINGE | INTRAVENOUS | Status: AC
  Filled 2017-04-21: qty 10

## 2017-04-21 MED ORDER — SUCCINYLCHOLINE CHLORIDE 200 MG/10ML IV SOSY
PREFILLED_SYRINGE | INTRAVENOUS | Status: AC
Start: 2017-04-21 — End: 2017-04-21
  Filled 2017-04-21: qty 10

## 2017-04-21 MED ORDER — 0.9 % SODIUM CHLORIDE (POUR BTL) OPTIME
TOPICAL | Status: DC | PRN
  Administered 2017-04-21: 1000 mL

## 2017-04-21 MED ORDER — OXYCODONE-ACETAMINOPHEN 5-325 MG PO TABS
ORAL_TABLET | ORAL | Status: AC
  Filled 2017-04-21: qty 1

## 2017-04-21 MED ORDER — LACTATED RINGERS IV SOLN
INTRAVENOUS | Status: DC
  Administered 2017-04-21: 10:00:00 via INTRAVENOUS

## 2017-04-21 MED ORDER — POTASSIUM CHLORIDE CRYS ER 20 MEQ PO TBCR: 20.0000 meq | EXTENDED_RELEASE_TABLET | Freq: Every day | ORAL | Status: DC | PRN

## 2017-04-21 MED ORDER — MORPHINE SULFATE (PF) 2 MG/ML IV SOLN
2.0000 mg | INTRAVENOUS | Status: DC | PRN
  Administered 2017-04-22: 2 mg via INTRAVENOUS
  Filled 2017-04-21: qty 1

## 2017-04-21 MED ORDER — POLYETHYLENE GLYCOL 3350 17 G PO PACK: 17.0000 g | PACK | Freq: Every day | ORAL | Status: DC | PRN

## 2017-04-21 MED ORDER — DOCUSATE SODIUM 100 MG PO CAPS
100.0000 mg | ORAL_CAPSULE | Freq: Every day | ORAL | Status: DC
  Administered 2017-04-22: 100 mg via ORAL
  Filled 2017-04-21: qty 1

## 2017-04-21 MED ORDER — FENTANYL CITRATE (PF) 250 MCG/5ML IJ SOLN
INTRAMUSCULAR | Status: AC
  Filled 2017-04-21: qty 5

## 2017-04-21 MED ORDER — ALUM & MAG HYDROXIDE-SIMETH 200-200-20 MG/5ML PO SUSP: 15.0000 mL | ORAL | Status: DC | PRN

## 2017-04-21 MED ORDER — ROSUVASTATIN CALCIUM 40 MG PO TABS: 40.0000 mg | ORAL_TABLET | Freq: Every day | ORAL | Status: DC

## 2017-04-21 MED ORDER — CAPSAICIN 0.025 % EX CREA
1.0000 "application " | TOPICAL_CREAM | Freq: Two times a day (BID) | CUTANEOUS | Status: DC | PRN
  Filled 2017-04-21: qty 60

## 2017-04-21 MED ORDER — PROPOFOL 10 MG/ML IV BOLUS
INTRAVENOUS | Status: AC
  Filled 2017-04-21: qty 20

## 2017-04-21 MED ORDER — SODIUM CHLORIDE 0.9 % IV SOLN
INTRAVENOUS | Status: DC
  Administered 2017-04-21: 18:00:00 via INTRAVENOUS

## 2017-04-21 MED ORDER — ONDANSETRON HCL 4 MG/2ML IJ SOLN
INTRAMUSCULAR | Status: AC
  Filled 2017-04-21: qty 2

## 2017-04-21 MED ORDER — BISACODYL 10 MG RE SUPP: 10.0000 mg | Freq: Every day | RECTAL | Status: DC | PRN

## 2017-04-21 MED ORDER — PAROXETINE HCL 20 MG PO TABS
20.0000 mg | ORAL_TABLET | Freq: Every day | ORAL | Status: DC
  Administered 2017-04-22: 20 mg via ORAL
  Filled 2017-04-21: qty 1

## 2017-04-21 MED ORDER — LABETALOL HCL 5 MG/ML IV SOLN: 10.0000 mg | INTRAVENOUS | Status: DC | PRN

## 2017-04-21 MED ORDER — SODIUM CHLORIDE 0.9 % IV SOLN
INTRAVENOUS | Status: DC
  Administered 2017-04-21: 14:00:00 via INTRAVENOUS

## 2017-04-21 MED ORDER — HEPARIN SODIUM (PORCINE) 1000 UNIT/ML IJ SOLN
INTRAMUSCULAR | Status: DC | PRN
Start: 2017-04-21 — End: 2017-04-21
  Administered 2017-04-21: 2000 [IU] via INTRAVENOUS
  Administered 2017-04-21: 10000 [IU] via INTRAVENOUS

## 2017-04-21 MED ORDER — PHENOL 1.4 % MT LIQD: 1.0000 | OROMUCOSAL | Status: DC | PRN

## 2017-04-21 MED ORDER — GUAIFENESIN-DM 100-10 MG/5ML PO SYRP: 15.0000 mL | ORAL_SOLUTION | ORAL | Status: DC | PRN

## 2017-04-21 MED ORDER — ASPIRIN 81 MG PO CHEW
81.0000 mg | CHEWABLE_TABLET | Freq: Every day | ORAL | Status: DC
  Administered 2017-04-22: 81 mg via ORAL
  Filled 2017-04-21: qty 1

## 2017-04-21 MED ORDER — LIDOCAINE HCL 4 % EX SOLN
CUTANEOUS | Status: DC | PRN
  Administered 2017-04-21: 2.5 mL via TOPICAL

## 2017-04-21 MED ORDER — LIDOCAINE 2% (20 MG/ML) 5 ML SYRINGE
INTRAMUSCULAR | Status: DC | PRN
  Administered 2017-04-21: 50 mg via INTRAVENOUS

## 2017-04-21 MED ORDER — MAGNESIUM SULFATE 2 GM/50ML IV SOLN: 2.0000 g | Freq: Every day | INTRAVENOUS | Status: DC | PRN

## 2017-04-21 MED ORDER — PHENYLEPHRINE 40 MCG/ML (10ML) SYRINGE FOR IV PUSH (FOR BLOOD PRESSURE SUPPORT)
PREFILLED_SYRINGE | INTRAVENOUS | Status: DC | PRN
  Administered 2017-04-21: 120 ug via INTRAVENOUS
  Administered 2017-04-21: 80 ug via INTRAVENOUS

## 2017-04-21 MED ORDER — ONDANSETRON HCL 4 MG/2ML IJ SOLN: 4.0000 mg | Freq: Four times a day (QID) | INTRAMUSCULAR | Status: DC | PRN

## 2017-04-21 MED ORDER — FENTANYL CITRATE (PF) 100 MCG/2ML IJ SOLN
25.0000 ug | INTRAMUSCULAR | Status: DC | PRN
  Administered 2017-04-21 (×3): 50 ug via INTRAVENOUS

## 2017-04-21 MED ORDER — ACETAMINOPHEN 325 MG RE SUPP
325.0000 mg | RECTAL | Status: DC | PRN
  Filled 2017-04-21: qty 2

## 2017-04-21 MED ORDER — HEPARIN SODIUM (PORCINE) 5000 UNIT/ML IJ SOLN: 5000.0000 [IU] | Freq: Three times a day (TID) | INTRAMUSCULAR | Status: DC

## 2017-04-21 MED ORDER — SODIUM CHLORIDE 0.9 % IV SOLN
INTRAVENOUS | Status: DC | PRN
  Administered 2017-04-21: .5 ug/kg/min via INTRAVENOUS

## 2017-04-21 MED ORDER — PHENYLEPHRINE HCL 10 MG/ML IJ SOLN
INTRAVENOUS | Status: DC | PRN
  Administered 2017-04-21: 25 ug/min via INTRAVENOUS

## 2017-04-21 MED ORDER — LIDOCAINE 2% (20 MG/ML) 5 ML SYRINGE
INTRAMUSCULAR | Status: AC
  Filled 2017-04-21: qty 5

## 2017-04-21 MED ORDER — SODIUM CHLORIDE 0.9 % IV SOLN: 500.0000 mL | Freq: Once | INTRAVENOUS | Status: DC | PRN

## 2017-04-21 MED ORDER — OXYCODONE-ACETAMINOPHEN 5-325 MG PO TABS
1.0000 | ORAL_TABLET | ORAL | Status: DC | PRN
  Administered 2017-04-21: 1 via ORAL
  Administered 2017-04-22: 2 via ORAL
  Filled 2017-04-21: qty 2

## 2017-04-21 MED ORDER — ONDANSETRON HCL 4 MG/2ML IJ SOLN
INTRAMUSCULAR | Status: DC | PRN
  Administered 2017-04-21: 4 mg via INTRAVENOUS

## 2017-04-21 MED ORDER — ACETAMINOPHEN 325 MG PO TABS: 325.0000 mg | ORAL_TABLET | ORAL | Status: DC | PRN

## 2017-04-21 MED ORDER — PANTOPRAZOLE SODIUM 40 MG PO TBEC
40.0000 mg | DELAYED_RELEASE_TABLET | Freq: Every day | ORAL | Status: DC
  Administered 2017-04-22: 40 mg via ORAL
  Filled 2017-04-21: qty 1

## 2017-04-21 MED ORDER — IODIXANOL 320 MG/ML IV SOLN
INTRAVENOUS | Status: DC | PRN
  Administered 2017-04-21: 150 mL via INTRAVENOUS

## 2017-04-21 SURGICAL SUPPLY — 60 items
BAG SNAP BAND KOVER 36X36 (MISCELLANEOUS) ×3 IMPLANT
BLADE CLIPPER SURG (BLADE) ×3 IMPLANT
CANISTER SUCT 3000ML PPV (MISCELLANEOUS) ×3 IMPLANT
CATH ANGIO 5F BER2 65CM (CATHETERS) IMPLANT
CATH BEACON 5.038 65CM KMP-01 (CATHETERS) ×3 IMPLANT
CATH OMNI FLUSH .035X70CM (CATHETERS) ×3 IMPLANT
COVER PROBE W GEL 5X96 (DRAPES) IMPLANT
DERMABOND ADVANCED (GAUZE/BANDAGES/DRESSINGS) ×4
DERMABOND ADVANCED .7 DNX12 (GAUZE/BANDAGES/DRESSINGS) ×2 IMPLANT
DEVICE CLOSURE PERCLS PRGLD 6F (VASCULAR PRODUCTS) ×4 IMPLANT
DRAPE ZERO GRAVITY STERILE (DRAPES) IMPLANT
DRSG TEGADERM 2-3/8X2-3/4 SM (GAUZE/BANDAGES/DRESSINGS) ×6 IMPLANT
DRYSEAL FLEXSHEATH 12FR 33CM (SHEATH) ×2
DRYSEAL FLEXSHEATH 18FR 33CM (SHEATH) ×2
ELECT CAUTERY BLADE 6.4 (BLADE) ×3 IMPLANT
ELECT REM PT RETURN 9FT ADLT (ELECTROSURGICAL) ×6
ELECTRODE REM PT RTRN 9FT ADLT (ELECTROSURGICAL) ×2 IMPLANT
EXCLUDER TNK LEG 31MX14X15 (Endovascular Graft) ×1 IMPLANT
EXCLUDER TRUNK LEG 31MX14X15 (Endovascular Graft) ×3 IMPLANT
GAUZE SPONGE 2X2 8PLY STRL LF (GAUZE/BANDAGES/DRESSINGS) ×2 IMPLANT
GLOVE BIOGEL PI IND STRL 7.5 (GLOVE) ×1 IMPLANT
GLOVE BIOGEL PI INDICATOR 7.5 (GLOVE) ×2
GLOVE SURG SS PI 7.5 STRL IVOR (GLOVE) ×3 IMPLANT
GOWN STRL REUS W/ TWL LRG LVL3 (GOWN DISPOSABLE) ×3 IMPLANT
GOWN STRL REUS W/ TWL XL LVL3 (GOWN DISPOSABLE) ×1 IMPLANT
GOWN STRL REUS W/TWL LRG LVL3 (GOWN DISPOSABLE) ×6
GOWN STRL REUS W/TWL XL LVL3 (GOWN DISPOSABLE) ×2
GRAFT BALLN CATH 65CM (STENTS) ×1 IMPLANT
HEMOSTAT SNOW SURGICEL 2X4 (HEMOSTASIS) IMPLANT
KIT BASIN OR (CUSTOM PROCEDURE TRAY) ×3 IMPLANT
KIT ROOM TURNOVER OR (KITS) ×3 IMPLANT
LEG CONTRALATERAL 16X14.5X10 (Vascular Products) ×3 IMPLANT
LEG CONTRALATERAL 16X14.5X12 (Vascular Products) ×3 IMPLANT
NEEDLE PERC 18GX7CM (NEEDLE) ×3 IMPLANT
NS IRRIG 1000ML POUR BTL (IV SOLUTION) ×3 IMPLANT
PACK ENDOVASCULAR (PACKS) ×3 IMPLANT
PAD ARMBOARD 7.5X6 YLW CONV (MISCELLANEOUS) ×6 IMPLANT
PENCIL BUTTON HOLSTER BLD 10FT (ELECTRODE) ×3 IMPLANT
PERCLOSE PROGLIDE 6F (VASCULAR PRODUCTS) ×12
SHEATH AVANTI 11CM 8FR (MISCELLANEOUS) ×3 IMPLANT
SHEATH BRITE TIP 8FR 23CM (MISCELLANEOUS) ×3 IMPLANT
SHEATH DRYSEAL FLEX 12FR 33CM (SHEATH) ×1 IMPLANT
SHEATH DRYSEAL FLEX 18FR 33CM (SHEATH) ×1 IMPLANT
SHIELD RADPAD SCOOP 12X17 (MISCELLANEOUS) ×6 IMPLANT
SPONGE GAUZE 2X2 STER 10/PKG (GAUZE/BANDAGES/DRESSINGS) ×4
STENT GRAFT BALLN CATH 65CM (STENTS) ×2
STOPCOCK MORSE 400PSI 3WAY (MISCELLANEOUS) ×3 IMPLANT
SUT ETHILON 3 0 PS 1 (SUTURE) IMPLANT
SUT PROLENE 5 0 C 1 24 (SUTURE) IMPLANT
SUT VIC AB 2-0 CT1 27 (SUTURE)
SUT VIC AB 2-0 CT1 TAPERPNT 27 (SUTURE) IMPLANT
SUT VIC AB 3-0 SH 27 (SUTURE)
SUT VIC AB 3-0 SH 27X BRD (SUTURE) IMPLANT
SUT VICRYL 4-0 PS2 18IN ABS (SUTURE) IMPLANT
SYR 30ML LL (SYRINGE) ×3 IMPLANT
TOWEL GREEN STERILE (TOWEL DISPOSABLE) ×3 IMPLANT
TRAY FOLEY MTR SLVR 16FR STAT (CATHETERS) ×3 IMPLANT
TUBING HIGH PRESSURE 120CM (CONNECTOR) ×3 IMPLANT
WIRE AMPLATZ SS-J .035X180CM (WIRE) ×6 IMPLANT
WIRE BENTSON .035X145CM (WIRE) ×6 IMPLANT

## 2017-04-21 NOTE — Progress Notes (Signed)
  Day of Surgery Note    Subjective:  Resting comfortably   Vitals:   04/21/17 0917  BP: (!) 150/77  Pulse: 74  Resp: 20  Temp: 98.7 F (37.1 C)  SpO2: 97%    Incisions:   Bilateral groins are soft without hematoma Extremities:  +palpable left AT and right PT Cardiac:  regular Lungs:  Non labored Abdomen:  Soft, non tender   Assessment/Plan:  This is a 72 y.o. male who is s/p  EVAR  -pt doing well in OR as there is a pacu hold. -to recovery and then [place] later today. -anticipate discharge tomorrow    Leontine Locket, PA-C 04/21/2017 3:50 PM [phone]

## 2017-04-21 NOTE — Anesthesia Procedure Notes (Addendum)
Arterial Line Insertion Start/End2/27/2019 10:30 AM, 04/21/2017 10:40 AM Performed by: Wilburn Cornelia, CRNA, CRNA  Patient location: Pre-op. Preanesthetic checklist: patient identified, IV checked, site marked, risks and benefits discussed, surgical consent, monitors and equipment checked, pre-op evaluation and timeout performed Lidocaine 1% used for infiltration Right, radial was placed Catheter size: 20 G Hand hygiene performed  and maximum sterile barriers used  Allen's test indicative of satisfactory collateral circulation Attempts: 2 Procedure performed without using ultrasound guided technique. Following insertion, Biopatch and dressing applied. Post procedure assessment: normal

## 2017-04-21 NOTE — Interval H&P Note (Signed)
History and Physical Interval Note:  04/21/2017 11:39 AM  Hector Morris  has presented today for surgery, with the diagnosis of ABDOMINAL AORTIC ANEURYSM  The various methods of treatment have been discussed with the patient and family. After consideration of risks, benefits and other options for treatment, the patient has consented to  Procedure(s): ABDOMINAL AORTIC ENDOVASCULAR STENT GRAFT (N/A) as a surgical intervention .  The patient's history has been reviewed, patient examined, no change in status, stable for surgery.  I have reviewed the patient's chart and labs.  Questions were answered to the patient's satisfaction.     Annamarie Major

## 2017-04-21 NOTE — Anesthesia Postprocedure Evaluation (Signed)
Anesthesia Post Note  Patient: Hector Morris  Procedure(s) Performed: ABDOMINAL AORTIC ENDOVASCULAR STENT GRAFT (N/A )     Patient location during evaluation: PACU Anesthesia Type: General Level of consciousness: awake and alert Pain management: pain level controlled Vital Signs Assessment: post-procedure vital signs reviewed and stable Respiratory status: spontaneous breathing, nonlabored ventilation, respiratory function stable and patient connected to nasal cannula oxygen Cardiovascular status: blood pressure returned to baseline and stable Postop Assessment: no apparent nausea or vomiting Anesthetic complications: no    Last Vitals:  Vitals:   04/21/17 1635 04/21/17 1645  BP:  (!) 147/80  Pulse: 69 64  Resp: 14 12  Temp:    SpO2: 100% 98%    Last Pain:  Vitals:   04/21/17 1635  TempSrc:   PainSc: 6                  Barbette Mcglaun S

## 2017-04-21 NOTE — Transfer of Care (Signed)
Immediate Anesthesia Transfer of Care Note  Patient: Hector Morris  Procedure(s) Performed: ABDOMINAL AORTIC ENDOVASCULAR STENT GRAFT (N/A )  Patient Location: PACU  Anesthesia Type:General  Level of Consciousness: awake, alert  and oriented  Airway & Oxygen Therapy: Patient Spontanous Breathing and Patient connected to nasal cannula oxygen  Post-op Assessment: Report given to RN and Post -op Vital signs reviewed and stable  Post vital signs: Reviewed and stable  Last Vitals:  Vitals:   04/21/17 0917  BP: (!) 150/77  Pulse: 74  Resp: 20  Temp: 37.1 C  SpO2: 97%    Last Pain:  Vitals:   04/21/17 0917  TempSrc: Oral      Patients Stated Pain Goal: 4 (75/10/25 8527)  Complications: No apparent anesthesia complications

## 2017-04-21 NOTE — Anesthesia Preprocedure Evaluation (Signed)
Anesthesia Evaluation  Patient identified by MRN, date of birth, ID band Patient awake    Reviewed: Allergy & Precautions, NPO status , Patient's Chart, lab work & pertinent test results  Airway Mallampati: I  TM Distance: >3 FB Neck ROM: Full    Dental  (+) Edentulous Upper, Edentulous Lower   Pulmonary sleep apnea and Continuous Positive Airway Pressure Ventilation , former smoker,    breath sounds clear to auscultation       Cardiovascular + CAD and + Peripheral Vascular Disease   Rhythm:Regular Rate:Normal     Neuro/Psych Anxiety  Neuromuscular disease    GI/Hepatic Neg liver ROS, hiatal hernia, GERD  Medicated,  Endo/Other  negative endocrine ROS  Renal/GU negative Renal ROS     Musculoskeletal  (+) Arthritis ,   Abdominal Normal abdominal exam  (+)   Peds  Hematology negative hematology ROS (+)   Anesthesia Other Findings - HLD  Reproductive/Obstetrics                             Anesthesia Physical Anesthesia Plan  ASA: III  Anesthesia Plan: General   Post-op Pain Management:    Induction: Intravenous  PONV Risk Score and Plan: 3 and Ondansetron and Treatment may vary due to age or medical condition  Airway Management Planned: Oral ETT  Additional Equipment: Arterial line  Intra-op Plan:   Post-operative Plan: Extubation in OR  Informed Consent: I have reviewed the patients History and Physical, chart, labs and discussed the procedure including the risks, benefits and alternatives for the proposed anesthesia with the patient or authorized representative who has indicated his/her understanding and acceptance.   Dental advisory given  Plan Discussed with: CRNA  Anesthesia Plan Comments:         Anesthesia Quick Evaluation

## 2017-04-21 NOTE — Op Note (Signed)
Patient name: SHJON LIZARRAGA MRN: [id number] DOB: 1945/05/20 Sex: male  04/21/2017 Pre-operative Diagnosis: AAA Post-operative diagnosis:  Same Surgeon:  Annamarie Major Assistants:  Leontine Locket Procedure:   #1: Endovascular repair of abdominal aortic aneurysm   #2: Bilateral ultrasound-guided common femoral artery access   #3: Catheter in aorta x2   4: Abdominal aortogram Anesthesia: General Blood Loss: Minimal Specimens: None  Findings: Complete exclusion  Devices used: Main body was primary left Gore 31 x 14 x 15.  Ipsilateral extension was a Gore 14 x 10.  Contralateral right was a Gore 14 x 12  Indications: The patient was found to have a 5 cm infrarenal abdominal aortic aneurysm which he strongly wanted repaired.  He comes in today for his operation.  Risks and benefits were discussed at the preoperative visit.  Procedure:  The patient was identified in the holding area and taken to [place] [facility]  The patient was then placed supine on the table. general anesthesia was administered.  The patient was prepped and draped in the usual sterile fashion.  A time out was called and antibiotics were administered.  Ultrasound was used to evaluate bilateral common femoral arteries which had mild calcification and were widely patent.  A #11 blade was used to make a skin nick.  Bilateral common femoral arteries were then cannulated under ultrasound guidance with an 18-gauge needle.  An 035 wire was advanced without resistance.  The subtendinous tract was dilated with an 8 [place] dilator.  Probe glide devices were deployed at the 11:00 and 1 o'clock position for pre-closure.  8 French sheaths were placed bilaterally.  The patient was fully heparinized.  An Amplatz superstiff wire was then placed up the left side followed by removal of the 8 French sheath and placement of a 18 French sheath.  An Omni Flush catheter was advanced up the right side and positioned at the level of L2.  The main body  device was prepared on the back table.  This was a Gore 31 x 14 x 15 device.  A contrast injection was performed locating the lower left renal artery.  The main body was then deployed down to the contralateral gate.  Next, the contralateral gate was cannulated with a Kumpe catheter and a Bentson wire.  The Kumpe catheter was removed and a Omni Flush catheter was then inserted and able to be freely rotated within the main body, confirming successful cannulation of the gait.  An Amplatz superstiff wire was placed.  The image detector was then brought into a left anterior oblique position.  A retrograde injection through the sheath in the right groin was performed locating the right hypogastric artery.  An Amplatz superstiff wire was placed.  The 8 French sheath was removed and a 12 [place] sheath was advanced into the contralateral gate.  The contralateral leg was then prepared on the back table and inserted.  This was a Gore 14 x 12 device.  It was then deployed landing just proximal to the right hypogastric artery.  Next the image detector was rotated to a right anterior oblique position.  A retrograde injection was performed through the sheath locating the left hypogastric artery.  The ipsilateral limb was then fully deployed.  Next, a ipsilateral extension was prepared on the back table.  This was a Gore 14 x 10 device.  It was then inserted through the sheath and deployed landing at the level of the left hypogastric artery.  Next, a MOB  37 balloon was used to mold the proximal and distal attachment sites as well as the device overlap.  There was a stenosis in the right common iliac artery which was successfully dilated with the balloon.  A completion arteriogram was then performed which showed continued patency of bilateral renal arteries as well as bilateral hypogastric and external iliac arteries.  There is no evidence of a type I or type III leak.  At this point, the Amplatz superstiff wires were exchanged out  for 035 Bentson wires.  Both sheaths in the groin were removed and the probe glide devices were secured, closing the arteriotomy.  There was good hemostasis.  I did not reverse the heparin with protamine as the patient had a shellfish allergy.  Manual pressure was held on the groins for 5 minutes.  I then used cautery to help close the stab incisions.  Dermabond was applied.  The patient had brisk Doppler signals in his feet.  He was successfully extubated and taken to recovery room in stable condition.  There were no immediate complications.   Disposition: To PACU stable.   Theotis Burrow, M.D. [facility] [place] Office: [phone] Pager:  [phone]

## 2017-04-21 NOTE — Progress Notes (Signed)
D; PTT>200, PT 15.5 Notified Dr Oneida Alar, no new order received

## 2017-04-21 NOTE — Discharge Instructions (Signed)
  [facility]   Discharge Instructions  Endovascular Aortic Aneurysm Repair  Please refer to the following instructions for your post-procedure care. Your surgeon or Physician Assistant will discuss any changes with you.  Activity  You are encouraged to walk as much as you can. You can slowly return to normal activities but must avoid strenuous activity and heavy lifting until your doctor tells you it's OK. Avoid activities such as vacuuming or swinging a gold club. It is normal to feel tired for several weeks after your surgery. Do not drive until your doctor gives the OK and you are no longer taking prescription pain medications. It is also normal to have difficulty with sleep habits, eating, and bowel movements after surgery. These will go away with time.  Bathing/Showering  Shower daily after you go home.  Do not soak in a bathtub, hot tub, or swim until the incision heals completely.  If you have incisions in your groin, wash the groin wounds with soap and water daily and pat dry. (No tub bath-only shower)  Then put a dry gauze or washcloth there to keep this area dry to help prevent wound infection daily and as needed.  Do not use Vaseline or neosporin on your incisions.  Only use soap and water on your incisions and then protect and keep dry.  Incision Care  Shower every day. Clean your incision with mild soap and water. Pat the area dry with a clean towel. You do not need a bandage unless otherwise instructed. Do not apply any ointments or creams to your incision. If you clothing is irritating, you may cover your incision with a dry gauze pad.  Diet  Resume your normal diet. There are no special food restrictions following this procedure. A low fat/low cholesterol diet is recommended for all patients with vascular disease. In order to heal from your surgery, it is CRITICAL to get adequate nutrition. Your body requires vitamins, minerals, and protein.  Vegetables are the best source of vitamins and minerals. Vegetables also provide the perfect balance of protein. Processed food has little nutritional value, so try to avoid this.  Medications  Resume taking all of your medications unless your doctor or nurse practitioner tells you not to. If your incision is causing pain, you may take over-the-counter pain relievers such as acetaminophen (Tylenol). If you were prescribed a stronger pain medication, please be aware these medications can cause nausea and constipation. Prevent nausea by taking the medication with a snack or meal. Avoid constipation by drinking plenty of fluids and eating foods with a high amount of fiber, such as fruits, vegetables, and grains.  Do not take Tylenol if you are taking prescription pain medications.   Follow up  Our office will schedule a follow-up appointment with a CT scan 3-4 weeks after your surgery.  Please call us immediately for any of the following conditions  Severe or worsening pain in your legs or feet or in your abdomen back or chest. Increased pain, redness, drainage (pus) from your incision site. Increased abdominal pain, bloating, nausea, vomiting or persistent diarrhea. Fever of 101 degrees or higher. Swelling in your leg (s),  Reduce your risk of vascular disease  Stop smoking. If you would like help call [facility] at 1-800-QUIT-NOW (1-800-784-8669) or [place] at [phone]. Manage your cholesterol Maintain a desired weight Control your diabetes Keep your blood pressure down  If you have questions, please call the office at [phone].  

## 2017-04-22 ENCOUNTER — Other Ambulatory Visit: Payer: Self-pay

## 2017-04-22 ENCOUNTER — Encounter (HOSPITAL_COMMUNITY): Payer: Self-pay | Admitting: Surgery

## 2017-04-22 LAB — BASIC METABOLIC PANEL
ANION GAP: 6 (ref 5–15)
BUN: 12 mg/dL (ref 6–20)
CO2: 18 mmol/L — ABNORMAL LOW (ref 22–32)
Calcium: 6.4 mg/dL — CL (ref 8.9–10.3)
Chloride: 118 mmol/L — ABNORMAL HIGH (ref 101–111)
Creatinine, Ser: 0.82 mg/dL (ref 0.61–1.24)
GLUCOSE: 93 mg/dL (ref 65–99)
POTASSIUM: 2.8 mmol/L — AB (ref 3.5–5.1)
Sodium: 142 mmol/L (ref 135–145)

## 2017-04-22 LAB — CBC
HCT: 29.6 % — ABNORMAL LOW (ref 39.0–52.0)
Hemoglobin: 9.8 g/dL — ABNORMAL LOW (ref 13.0–17.0)
MCH: 29.8 pg (ref 26.0–34.0)
MCHC: 33.1 g/dL (ref 30.0–36.0)
MCV: 90 fL (ref 78.0–100.0)
PLATELETS: 212 10*3/uL (ref 150–400)
RBC: 3.29 MIL/uL — AB (ref 4.22–5.81)
RDW: 12.4 % (ref 11.5–15.5)
WBC: 11.6 10*3/uL — AB (ref 4.0–10.5)

## 2017-04-22 LAB — MAGNESIUM: MAGNESIUM: 1.2 mg/dL — AB (ref 1.7–2.4)

## 2017-04-22 MED ORDER — CALCIUM CARBONATE 1250 (500 CA) MG PO TABS
2.0000 | ORAL_TABLET | Freq: Once | ORAL | Status: AC
  Administered 2017-04-22: 1000 mg via ORAL
  Filled 2017-04-22: qty 2

## 2017-04-22 MED ORDER — OXYCODONE-ACETAMINOPHEN 5-325 MG PO TABS: 1.0000 | ORAL_TABLET | Freq: Four times a day (QID) | ORAL | 0 refills | Status: DC | PRN

## 2017-04-22 MED ORDER — POTASSIUM CHLORIDE CRYS ER 20 MEQ PO TBCR
40.0000 meq | EXTENDED_RELEASE_TABLET | Freq: Two times a day (BID) | ORAL | Status: AC
  Administered 2017-04-22: 40 meq via ORAL
  Filled 2017-04-22: qty 2

## 2017-04-22 NOTE — Progress Notes (Signed)
  Progress Note    04/22/2017 7:13 AM 1 Day Post-Op  Subjective:  Had some hip pain last night but better with morphine  Afebrile HR 70's-80's NSR 357'S-177'L systolic 39% RA  Vitals:   04/22/17 0026 04/22/17 0450  BP: (!) 160/73 136/74  Pulse: 70 90  Resp: (!) 28 19  Temp: 98.5 F (36.9 C) 98.8 F (37.1 C)  SpO2: 98% 98%    Physical Exam: Cardiac:  regular Lungs:  Non labored Incisions:  Bilateral groins are soft without hematoma Extremities:  Bilateral feet are warm and well perfused. Abdomen:  Soft, non tender to palpation  CBC    Component Value Date/Time   WBC 11.6 (H) 04/22/2017 0245   RBC 3.29 (L) 04/22/2017 0245   HGB 9.8 (L) 04/22/2017 0245   HCT 29.6 (L) 04/22/2017 0245   PLT 212 04/22/2017 0245   MCV 90.0 04/22/2017 0245   MCH 29.8 04/22/2017 0245   MCHC 33.1 04/22/2017 0245   RDW 12.4 04/22/2017 0245    BMET    Component Value Date/Time   NA 142 04/22/2017 0245   K 2.8 (L) 04/22/2017 0245   CL 118 (H) 04/22/2017 0245   CO2 18 (L) 04/22/2017 0245   GLUCOSE 93 04/22/2017 0245   BUN 12 04/22/2017 0245   CREATININE 0.82 04/22/2017 0245   CALCIUM 6.4 (LL) 04/22/2017 0245   GFRNONAA >60 04/22/2017 0245   GFRAA >60 04/22/2017 0245    INR    Component Value Date/Time   INR 1.24 04/21/2017 1700     Intake/Output Summary (Last 24 hours) at 04/22/2017 0713 Last data filed at 04/22/2017 0000 Gross per 24 hour  Intake 1320 ml  Output 700 ml  Net 620 ml     Assessment:  72 y.o. male is s/p:  Procedure:   #1: Endovascular repair of abdominal aortic aneurysm                         #2: Bilateral ultrasound-guided common femoral artery access                         #3: Catheter in aorta x2                         4: Abdominal aortogram  1 Day Post-Op  Plan: -pt doing well this am-he has voided and ambulated -he had some hip pain last night that resolved with morphine -will dc home today and f/u with Dr. Trula Slade in 4 weeks with  CTA    Leontine Locket, PA-C [facility] [phone] 04/22/2017 7:13 AM

## 2017-04-22 NOTE — Progress Notes (Signed)
MD notified of critical calcium value of 6.4. Orders: 1 g calcium carbonate & recheck in am. Will continue to monitor.   Fransico Michael, RN

## 2017-04-22 NOTE — Discharge Summary (Signed)
[facility] Discharge Summary   Hector Morris 07-26-1945 72 y.o. male  MRN: [id number]  Admission Date: 04/21/2017  Discharge Date: 04/22/17  Physician: Serafina Mitchell, MD  Admission Diagnosis: ABDOMINAL AORTIC ANEURYSM   HPI:   This is a 72 y.o. male  who is referred today for evaluation of an abdominal aortic aneurysm.  This was an incidental finding on a CT scan following a fall.  Maximum aortic diameter in the infrarenal aorta was 5.0 cm.  He has a 4.3 cm ascending aortic aneurysm.  The patient suffers from hypercholesterolemia, for which he takes a statin.  He is a former smoker.  He does have a family history of vascular disease.  He is very eager to get his aneurysm fixed.  Hospital Course:  The patient was admitted to the hospital and taken to the operating room on 04/21/2017 and underwent: Procedure:   #1: Endovascular repair of abdominal aortic aneurysm                         #2: Bilateral ultrasound-guided common femoral artery access                         #3: Catheter in aorta x2                         4: Abdominal aortogram     The pt tolerated the procedure well and was transported to the PACU in good condition.   By POD 1, he was doing well.  He was voiding without difficulty and ambulating well.  He did have some hip pain overnight that he contributed to laying in the bed but this resolved with morphine and walking. He does have hypokalemia of 2.8 and this is supplemented.    The remainder of the hospital course consisted of increasing mobilization and increasing intake of solids without difficulty.  CBC    Component Value Date/Time   WBC 11.6 (H) 04/22/2017 0245   RBC 3.29 (L) 04/22/2017 0245   HGB 9.8 (L) 04/22/2017 0245   HCT 29.6 (L) 04/22/2017 0245   PLT 212 04/22/2017 0245   MCV 90.0 04/22/2017 0245   MCH 29.8 04/22/2017 0245   MCHC 33.1 04/22/2017 0245   RDW 12.4 04/22/2017 0245    BMET    Component Value Date/Time   NA 142 04/22/2017  0245   K 2.8 (L) 04/22/2017 0245   CL 118 (H) 04/22/2017 0245   CO2 18 (L) 04/22/2017 0245   GLUCOSE 93 04/22/2017 0245   BUN 12 04/22/2017 0245   CREATININE 0.82 04/22/2017 0245   CALCIUM 6.4 (LL) 04/22/2017 0245   GFRNONAA >60 04/22/2017 0245   GFRAA >60 04/22/2017 0245         Discharge Diagnosis:  ABDOMINAL AORTIC ANEURYSM  Secondary Diagnosis: Patient Active Problem List   Diagnosis Date Noted  . S/P AAA repair using bifurcation graft 04/21/2017  . Hyperlipidemia LDL goal <70 03/23/2017  . Preoperative cardiovascular examination 03/08/2017  . Coronary artery calcification 03/08/2017  . Thoracic aortic aneurysm without rupture ([place]) 03/08/2017  . AAA (abdominal aortic aneurysm) without rupture ([place]) 03/08/2017  . Hx of colonic polyps 03/31/2011   Past Medical History:  Diagnosis Date  . Allergy   . Anxiety   . Arthritis   . Dyspnea    with exertion  . GERD (gastroesophageal reflux disease)   . History of hiatal hernia   .  History of kidney stones   . Hx of colonic polyps 03/31/2011  . Hyperlipidemia   . Neuromuscular disorder ([place])    hiatal hernia  . Sleep apnea    cpap     Allergies as of 04/22/2017      Reactions   Bee Venom Anaphylaxis, Swelling   Penicillins Anaphylaxis, Other (See Comments)   "Mushroom" popped up on hip Has patient had a PCN reaction causing immediate rash, facial/tongue/throat swelling, SOB or lightheadedness with hypotension: Yes Has patient had a PCN reaction causing severe rash involving mucus membranes or skin necrosis: Yes Has patient had a PCN reaction that required hospitalization: Yes - In hospital Has patient had a PCN reaction occurring within the last 10 years: No If all of the above answers are "NO", then may proceed with Cephalosporin use.   Shellfish Allergy Hives, Swelling   CRAB MEAT > WHELPS   Latex    UNSPECIFIED REACTION       Medication List    TAKE these medications   aspirin 81 MG chewable tablet Chew  81 mg by mouth daily.   capsaicin 0.025 % cream Commonly known as:  ZOSTRIX Apply 1 application topically 2 (two) times daily as needed (for feet).   metoCLOPramide 10 MG tablet Commonly known as:  REGLAN Take 10 mg by mouth daily as needed for nausea or vomiting.   omeprazole 20 MG capsule Commonly known as:  PRILOSEC Take 20 mg by mouth daily.   oxyCODONE-acetaminophen 5-325 MG tablet Commonly known as:  PERCOCET/ROXICET Take 1 tablet by mouth every 6 (six) hours as needed for moderate pain.   PARoxetine 40 MG tablet Commonly known as:  PAXIL Take 20 mg by mouth daily.   rosuvastatin 40 MG tablet Commonly known as:  CRESTOR Take 1 tablet (40 mg total) by mouth daily.       Discharge Instructions:  [facility]  Discharge Instructions Endovascular Aortic Aneurysm Repair  Please refer to the following instructions for your post-procedure care. Your surgeon or Physician Assistant will discuss any changes with you.  Activity  You are encouraged to walk as much as you can. You can slowly return to normal activities but must avoid strenuous activity and heavy lifting until your doctor tells you it's OK. Avoid activities such as vacuuming or swinging a gold club. It is normal to feel tired for several weeks after your surgery. Do not drive until your doctor gives the OK and you are no longer taking prescription pain medications. It is also normal to have difficulty with sleep habits, eating, and bowel movements after surgery. These will go away with time.  Bathing/Showering  You may shower after you go home. If you have an incision, do not soak in a bathtub, hot tub, or swim until the incision heals completely.  Incision Care  Shower every day. Clean your incision with mild soap and water. Pat the area dry with a clean towel. You do not need a bandage unless otherwise instructed. Do not apply any ointments or creams to your incision. If you  clothing is irritating, you may cover your incision with a dry gauze pad.  Diet  Resume your normal diet. There are no special food restrictions following this procedure. A low fat/low cholesterol diet is recommended for all patients with vascular disease. In order to heal from your surgery, it is CRITICAL to get adequate nutrition. Your body requires vitamins, minerals, and protein. Vegetables are the best source of vitamins  and minerals. Vegetables also provide the perfect balance of protein. Processed food has little nutritional value, so try to avoid this.  Medications  Resume taking all of your medications unless your doctor or Physician Assistnat tells you not to. If your incision is causing pain, you may take over-the-counter pain relievers such as acetaminophen (Tylenol). If you were prescribed a stronger pain medication, please be aware these medications can cause nausea and constipation. Prevent nausea by taking the medication with a snack or meal. Avoid constipation by drinking plenty of fluids and eating foods with a high amount of fiber, such as fruits, vegetables, and grains. Do not take Tylenol if you are taking prescription pain medications.   Follow up  [place] office will schedule a follow-up appointment with a C.T. scan 3-4 weeks after your surgery.  Please call us immediately for any of the following conditions  Severe or worsening pain in your legs or feet or in your abdomen back or chest. Increased pain, redness, drainage (pus) from your incision sit. Increased abdominal pain, bloating, nausea, vomiting or persistent diarrhea. Fever of 101 degrees or higher. Swelling in your leg (s),  Reduce your risk of vascular disease  .Stop smoking. If you would like help call [facility] at 1-800-QUIT-NOW (856)158-3104) or [place] at [phone]. .Manage your cholesterol .Maintain a desired weight .Control your diabetes .Keep your blood pressure down  If you have questions,  please call the office at [phone].    Prescriptions given: Roxicet #8 No Refill  Disposition: home  Patient's condition: is Good  Follow up: 1. Dr. Trula Slade in 4 weeks with CTA protocol   Leontine Locket, PA-C [facility] [phone] 04/22/2017  7:20 AM   - For VQI Registry use - Post-op:  Time to Extubation: [x]  In OR, [ ]  < 12 hrs, [ ]  12-24 hrs, [ ]  >=24 hrs Vasopressors Req. Post-op: No MI: No., [ ]  Troponin only, [ ]  EKG or Clinical New Arrhythmia: No CHF: No ICU Stay: 1 day in stepdown Transfusion: No     If yes, n/a units given  Complications: Resp failure: No., [ ]  Pneumonia, [ ]  Ventilator Chg in renal function: No., [ ]  Inc. Cr > 0.5, [ ]  Temp. Dialysis,  [ ]  Permanent dialysis Leg ischemia: No., no Surgery needed, [ ]  Yes, Surgery needed,  [ ]  Amputation Bowel ischemia: No., [ ]  Medical Rx, [ ]  Surgical Rx Wound complication: No., [ ]  Superficial separation/infection, [ ]  Return to OR Return to OR: No  Return to OR for bleeding: No Stroke: No., [ ]  Minor, [ ]  Major  Discharge medications: Statin use:  Yes  ASA use:  Yes  Plavix use:  No  Beta blocker use:  No  ARB use:  No ACEI use:  No CCB use:  No

## 2017-04-22 NOTE — Care Management Note (Signed)
Case Management Note  Patient Details  Name: Hector Morris MRN: [id number] Date of Birth: [birth year]  Subjective/Objective:     Admitted with abdominal aortic aneurysm.       Action/Plan: PCP is [facility] with [place].  Also uses [facility]. Patient with discharge orders home today.  No discharge needs noted.    Expected Discharge Date:  04/22/17               Expected Discharge Plan:  Home/Self Care  Discharge planning Services  CM Consult  Status of Service:  Completed, signed off  Beverely Pace, RN  Nurse case [place] 04/22/2017, 10:35 AM

## 2017-04-23 ENCOUNTER — Other Ambulatory Visit: Payer: Self-pay

## 2017-04-23 DIAGNOSIS — I714 Abdominal aortic aneurysm, without rupture, unspecified: Secondary | ICD-10-CM

## 2017-04-23 DIAGNOSIS — Z48812 Encounter for surgical aftercare following surgery on the circulatory system: Secondary | ICD-10-CM

## 2017-04-26 DIAGNOSIS — M5416 Radiculopathy, lumbar region: Secondary | ICD-10-CM | POA: Diagnosis not present

## 2017-04-26 DIAGNOSIS — M4696 Unspecified inflammatory spondylopathy, lumbar region: Secondary | ICD-10-CM | POA: Diagnosis not present

## 2017-04-29 ENCOUNTER — Encounter: Payer: Self-pay | Admitting: *Deleted

## 2017-04-29 DIAGNOSIS — M5416 Radiculopathy, lumbar region: Secondary | ICD-10-CM | POA: Diagnosis not present

## 2017-04-29 NOTE — Progress Notes (Signed)
Patient arrived at this office and brought ziploc bag with old groin dressings to give to this nurse.Marked left and right !  Wants to be sure they are shown to Dr. Trula Slade to make sure there is no infection. Both dressings have old bloody drainage about the size of a dime. Patient denies any redness, tenderness, swelling, heat at groin sites and denies fever. Instructed to continue keeping areas clean and dry and contact us if any changes.

## 2017-05-05 ENCOUNTER — Telehealth: Payer: Self-pay | Admitting: Surgery

## 2017-05-05 NOTE — Telephone Encounter (Signed)
-----   Message from Mena Goes, RN sent at 04/21/2017  3:36 PM EST ----- Regarding: 4 weeks w/ post op CTA for [facility]   ----- Message ----- From: Gabriel Earing, PA-C Sent: 04/21/2017   3:32 PM To: [facility] Charge Pool  S/p EVAR 04/21/17.  F/u with Dr. Trula Slade in 4 weeks with CTA protocol.  Thanks

## 2017-05-05 NOTE — Telephone Encounter (Signed)
Sched CTA at [facility] 05/20/17 at 11:00. Sched MD 05/24/17 at 4:00. Spoke to pt's wife to give them the appts and cta instructions.

## 2017-05-20 ENCOUNTER — Ambulatory Visit (HOSPITAL_COMMUNITY)
Admission: RE | Admit: 2017-05-20 | Discharge: 2017-05-20 | Disposition: A | Discharge status: Home / Self Care | Payer: Medicare Other | Source: Ambulatory Visit | Attending: Surgery | Admitting: Surgery

## 2017-05-20 DIAGNOSIS — K449 Diaphragmatic hernia without obstruction or gangrene: Secondary | ICD-10-CM | POA: Diagnosis not present

## 2017-05-20 DIAGNOSIS — I714 Abdominal aortic aneurysm, without rupture, unspecified: Secondary | ICD-10-CM

## 2017-05-20 DIAGNOSIS — Z48812 Encounter for surgical aftercare following surgery on the circulatory system: Secondary | ICD-10-CM | POA: Diagnosis not present

## 2017-05-20 DIAGNOSIS — Z9889 Other specified postprocedural states: Secondary | ICD-10-CM | POA: Diagnosis not present

## 2017-05-20 DIAGNOSIS — Z95818 Presence of other cardiac implants and grafts: Secondary | ICD-10-CM | POA: Insufficient documentation

## 2017-05-20 MED ORDER — IOPAMIDOL (ISOVUE-370) INJECTION 76%
100.0000 mL | Freq: Once | INTRAVENOUS | Status: AC | PRN
  Administered 2017-05-20: 100 mL via INTRAVENOUS

## 2017-05-24 ENCOUNTER — Ambulatory Visit (INDEPENDENT_AMBULATORY_CARE_PROVIDER_SITE_OTHER): Payer: Self-pay | Admitting: Surgery

## 2017-05-24 ENCOUNTER — Other Ambulatory Visit: Payer: Self-pay

## 2017-05-24 ENCOUNTER — Encounter: Payer: Self-pay | Admitting: Surgery

## 2017-05-24 VITALS — BP 206/108 | HR 82 | Temp 99.2°F | Resp 20 | Ht 70.0 in | Wt 222.0 lb

## 2017-05-24 DIAGNOSIS — I714 Abdominal aortic aneurysm, without rupture, unspecified: Secondary | ICD-10-CM

## 2017-05-24 NOTE — Progress Notes (Signed)
   Patient name: Hector Morris MRN: [id number] DOB: Dec 07, 1945 Sex: male  REASON FOR VISIT:     post op  HISTORY OF PRESENT ILLNESS:   Hector Morris is a 72 y.o. male who returns today for his postoperative follow-up.  On 04/21/2017 he underwent endovascular repair of a infrarenal abdominal aortic aneurysm with maximum diameter of 5.0 cm.  His postoperative course was complicated by exacerbation of the sciatic nerve issues.  Otherwise he did very well.  He has no complaints today.    CURRENT MEDICATIONS:    Current Outpatient Medications  Medication Sig Dispense Refill  . aspirin 81 MG chewable tablet Chew 81 mg by mouth daily.    . capsaicin (ZOSTRIX) 0.025 % cream Apply 1 application topically 2 (two) times daily as needed (for feet).    . metoCLOPramide (REGLAN) 10 MG tablet Take 10 mg by mouth daily as needed for nausea or vomiting.    Marland Kitchen omeprazole (PRILOSEC) 20 MG capsule Take 20 mg by mouth daily.    Marland Kitchen oxyCODONE-acetaminophen (PERCOCET/ROXICET) 5-325 MG tablet Take 1 tablet by mouth every 6 (six) hours as needed for moderate pain. 8 tablet 0  . PARoxetine (PAXIL) 40 MG tablet Take 20 mg by mouth daily.    . rosuvastatin (CRESTOR) 40 MG tablet Take 1 tablet (40 mg total) by mouth daily. 90 tablet 3   No current facility-administered medications for this visit.     REVIEW OF SYSTEMS:   [X]  denotes positive finding, [ ]  denotes negative finding Cardiac  Comments:  Chest pain or chest pressure:    Shortness of breath upon exertion:    Short of breath when lying flat:    Irregular heart rhythm:    Constitutional    Fever or chills:      PHYSICAL EXAM:   Vitals:   05/24/17 1535 05/24/17 1541  BP: (!) 196/104 (!) 206/108  Pulse: 82   Resp: 20   Temp: 99.2 F (37.3 C)   TempSrc: Oral   SpO2: 96%   Weight: 222 lb (100.7 kg)   Height: 5\' 10"  (1.778 m)     GENERAL: The patient is a well-nourished male, in no acute distress. The  vital signs are documented above. CARDIOVASCULAR: There is a regular rate and rhythm. PULMONARY: Non-labored respirations Groin incisions have healed nicely.  Abdomen is soft  STUDIES:   CT scan shows decrease in the aneurysm size from 5.0 down to 4.7 cm.  There is no evidence of endoleak   MEDICAL ISSUES:   Status post endovascular aneurysm repair.  His aneurysm is getting smaller.  There is no evidence of endoleak.  He will follow-up in 6 months with an ultrasound.  The patient is hypertensive today.  He is somewhat stressed because his daughter is in the emergency department.  I have encouraged him to check this once his stressors are decreased.  He is also going to make an appointment with his [place] doctor as he is currently not on antihypertension medicine.  Annamarie Major, MD [facility] [phone] Pager [phone]

## 2017-05-28 ENCOUNTER — Other Ambulatory Visit: Payer: Self-pay

## 2017-05-28 DIAGNOSIS — I714 Abdominal aortic aneurysm, without rupture, unspecified: Secondary | ICD-10-CM

## 2017-06-08 DIAGNOSIS — Z79899 Other long term (current) drug therapy: Secondary | ICD-10-CM | POA: Diagnosis not present

## 2017-06-08 DIAGNOSIS — G939 Disorder of brain, unspecified: Secondary | ICD-10-CM | POA: Diagnosis not present

## 2017-06-08 DIAGNOSIS — Z7982 Long term (current) use of aspirin: Secondary | ICD-10-CM | POA: Diagnosis not present

## 2017-06-08 DIAGNOSIS — R51 Headache: Secondary | ICD-10-CM | POA: Diagnosis not present

## 2017-06-08 DIAGNOSIS — I160 Hypertensive urgency: Secondary | ICD-10-CM | POA: Diagnosis not present

## 2017-06-08 DIAGNOSIS — E7800 Pure hypercholesterolemia, unspecified: Secondary | ICD-10-CM | POA: Diagnosis not present

## 2017-06-08 DIAGNOSIS — I169 Hypertensive crisis, unspecified: Secondary | ICD-10-CM | POA: Diagnosis not present

## 2017-06-08 DIAGNOSIS — Z87891 Personal history of nicotine dependence: Secondary | ICD-10-CM | POA: Diagnosis not present

## 2017-06-09 ENCOUNTER — Telehealth: Payer: Self-pay | Admitting: Internal Medicine

## 2017-06-09 MED ORDER — LISINOPRIL 20 MG PO TABS: 20.0000 mg | ORAL_TABLET | Freq: Every day | ORAL | 3 refills | Status: DC

## 2017-06-09 NOTE — Telephone Encounter (Signed)
New message   Spouse calling with BP concerns. Spouse states her husband went to the ED in [place] on yesterday with blood pressure in the 200's.    Pt c/o BP issue: STAT if pt c/o blurred vision, one-sided weakness or slurred speech  1. What are your last 5 BP readings? N/A  2. Are you having any other symptoms (ex. Dizziness, headache, blurred vision, passed out)? headache  3. What is your BP issue? High BP

## 2017-06-09 NOTE — Telephone Encounter (Signed)
Called pt with Pharm D recommendations. Pt verbalized understanding and would like to have prescription faxed to the [place] and advised it would be faxed over today. He also would prefer to go to [place] to have BP checked and monitored.

## 2017-06-09 NOTE — Telephone Encounter (Addendum)
Spoke with Pt's wife who states her husband has being experiencing severe headaches took BP it was 216/96 HR 80. Son took him to the hospital and wife states it was higher than that but cant remember the value. They were able to get it down to 160/77. This mornings BP 160/77 HR 78. Wife reports pt was recently started on Carvedilol 6.25 mg BID 06/02/17 at [facility]. Routed to Dr. Debara Pickett and Sherian Rein D

## 2017-06-09 NOTE — Telephone Encounter (Signed)
No intolerance or allergies to ACEi noted. Please initiate lisinopril 20mg  daily TODAY (add to carvedilol therapy)   Continue to monitor BP twice daily at home and bring records to next office visit.  Schedule for HTN clinic with pharmacist -  next available if patient agreeable.

## 2017-06-28 ENCOUNTER — Other Ambulatory Visit: Payer: Self-pay | Admitting: Internal Medicine

## 2017-06-28 DIAGNOSIS — I2510 Atherosclerotic heart disease of native coronary artery without angina pectoris: Secondary | ICD-10-CM | POA: Diagnosis not present

## 2017-06-28 DIAGNOSIS — E785 Hyperlipidemia, unspecified: Secondary | ICD-10-CM | POA: Diagnosis not present

## 2017-06-28 DIAGNOSIS — I2584 Coronary atherosclerosis due to calcified coronary lesion: Secondary | ICD-10-CM | POA: Diagnosis not present

## 2017-06-29 LAB — LIPID PANEL
CHOL/HDL RATIO: 4 ratio (ref 0.0–5.0)
Cholesterol, Total: 129 mg/dL (ref 100–199)
HDL: 32 mg/dL — AB (ref >39)
LDL Calculated: 67 mg/dL (ref 0–99)
Triglycerides: 148 mg/dL (ref 0–149)
VLDL Cholesterol Cal: 30 mg/dL (ref 5–40)

## 2017-07-05 ENCOUNTER — Telehealth: Payer: Self-pay | Admitting: Internal Medicine

## 2017-07-05 NOTE — Telephone Encounter (Signed)
New Message   Pt c/o BP issue:  1. What are your last 5 BP readings? 5/7 159/75 HR 70, 5/8 166/78 HR 75,  5/9 166/80 HR 73, 5/10 157/74 HR 76, 5/11 111/62 HR 62   2. Are you having any other symptoms (ex. Dizziness, headache, blurred vision, passed out)? Feels like when the BP is up he has a headache trying to come on  3. What is your medication issue? lisinopril (PRINIVIL,ZESTRIL) 20 MG tablet Patient states that Dr. Debara Pickett told him not to take the lisinopril but he feels when he takes he feels better. So he has been taking a 1/2 a tab.

## 2017-07-05 NOTE — Telephone Encounter (Signed)
BP looks better after starting lisinopril 10 mg. If he is higher in the evenings, he should take the lisinopril in the morning.  Dr. Lemmie Evens

## 2017-07-05 NOTE — Telephone Encounter (Signed)
Patient aware of MD recommendations. Will obtain printed Rx when MD is in office on Wednesday 07/07/17.

## 2017-07-05 NOTE — Telephone Encounter (Addendum)
Returned call to patient of Dr. Debara Pickett who is concerned about his BP. He takes carvedilol 6.25mg  BID and lisinopril 10mg  daily. He was advised on 4/17 to start lisinopril 20mg  after a triage call concerning his BP but [facility] in half and take 10mg  b/c his BP would get too low. He states his AM BP has been OK but his PM BP has been elevated. He had been off lisinopril for some time (reason unknown) and decided to start back on 10mg  on 5/10. He takes in the evenings. BP readings noted below. Advised that patient may need to continue to track BP for a longer period of time since he just recently resumed lisinopril 10mg  daily. He would like a printed Rx mailed to him for lisinopril. Will route to MD/CVRR for med adjustment assistance.   5/1  AM 127/68 HR 68  PM 161/81 HR 72 5/2 AM 136/79 HR 78  PM 133/74 HR 67 5/3 AM 148/81 HR 77  PM 166/83 HR 79 5/4 AM 136/79 HR 77  PM 136/69 HR 70 5/5 AM 122/70 HR 78  PM 153/70 HR 74 5/6 AM 136/72 HR 73  PM 137/69 HR 69  5/7 AM 148/78 HR 72  PM 159/75 HR 70 5/8 AM 146/82 HR 78  PM 156/78 HR 75  5/9 AM 138/73 HR 75  PM 166/80 HR 73 5/10 AM 144/80 HR 81  PM 159/74 HR 76 *started lisinopril 10 5/11 AM 116/68 HR 80  PM 111/62 HR 68 5/12 AM 116/79 HR 65  PM 142/75 HR 79 5/13 AM 122/73 HR 79

## 2017-07-07 MED ORDER — LISINOPRIL 20 MG PO TABS: 10.0000 mg | ORAL_TABLET | Freq: Every day | ORAL | 3 refills | Status: DC

## 2017-07-07 NOTE — Addendum Note (Signed)
Addended by: Fidel Levy on: 07/07/2017 07:53 AM   Modules accepted: Orders

## 2017-07-07 NOTE — Telephone Encounter (Signed)
Called patient to notify him that Rx for lisinopril will be mailed today. He reports BP since last conversation on 5/13 have been:   5/13 PM 136/69 HR 74 5/14 AM 136/70 HR 75         PM 127/68 HR 73  Advised to continue current meds (lisinopril 10 QAM). Call or send MyChart message if BP starts to get higher again.

## 2017-10-20 ENCOUNTER — Ambulatory Visit (INDEPENDENT_AMBULATORY_CARE_PROVIDER_SITE_OTHER): Payer: Medicare Other | Admitting: Internal Medicine

## 2017-10-20 ENCOUNTER — Encounter

## 2017-10-20 ENCOUNTER — Encounter: Payer: Self-pay | Admitting: Internal Medicine

## 2017-10-20 VITALS — BP 132/62 | HR 63 | Ht 70.0 in | Wt 215.2 lb

## 2017-10-20 DIAGNOSIS — I10 Essential (primary) hypertension: Secondary | ICD-10-CM

## 2017-10-20 DIAGNOSIS — I6529 Occlusion and stenosis of unspecified carotid artery: Secondary | ICD-10-CM | POA: Diagnosis not present

## 2017-10-20 DIAGNOSIS — Z01812 Encounter for preprocedural laboratory examination: Secondary | ICD-10-CM | POA: Diagnosis not present

## 2017-10-20 DIAGNOSIS — E785 Hyperlipidemia, unspecified: Secondary | ICD-10-CM

## 2017-10-20 DIAGNOSIS — I712 Thoracic aortic aneurysm, without rupture, unspecified: Secondary | ICD-10-CM

## 2017-10-20 DIAGNOSIS — Z8679 Personal history of other diseases of the circulatory system: Secondary | ICD-10-CM | POA: Diagnosis not present

## 2017-10-20 DIAGNOSIS — Z9889 Other specified postprocedural states: Secondary | ICD-10-CM | POA: Diagnosis not present

## 2017-10-20 NOTE — Patient Instructions (Signed)
Dr. Debara Pickett has ordered a CT angiogram of chest/aorta. This is due in December 2019.   Your physician wants you to follow-up in: 6 months with Dr. Debara Pickett. You will receive a reminder letter in the mail two months in advance. If you don't receive a letter, please call our office to schedule the follow-up appointment.

## 2017-10-21 ENCOUNTER — Encounter: Payer: Self-pay | Admitting: Internal Medicine

## 2017-10-21 NOTE — Progress Notes (Signed)
OFFICE CONSULT NOTE  Chief Complaint:  Follow-up stress test, labs  Primary Care Physician: Patient, No Pcp Per  HPI:  Hector Morris is a 72 y.o. male who is being seen today for the evaluation of aneurysm at the request of No ref. provider found. This is a pleasant 72 year old male who lives in [place].  He had a recent fall and he was evaluated by CT scan.  The CT scan performed on 02/16/2017, demonstrated a broken left rib which was managed medically, but incidentally demonstrated a 4.3 cm ascending thoracic aortic aneurysm, atherosclerosis of the thoracic aorta and a 5 cm infrarenal abdominal aortic aneurysm.  Annual follow-up of the ascending aortic aneurysm was recommended and referral to vascular surgery based on ACR consensus guidelines.  In addition, Hector Morris had a CT scan of the left shoulder in 2013 after a humeral fracture.  He was run over by a bull at that time.  CAT scan demonstrated coronary artery atherosclerosis, however he was not aware that he had that.  He does have a history of dyslipidemia and has been on simvastatin 40 mg nightly.  He reports remote cardiac catheterization in 1993 which showed no significant coronary disease.  He says he has had long-standing reflux symptoms although recently has been having some chest discomfort.  Most of the time and seems to improve with Tums or reflux medication and he has a known hiatal hernia.  03/23/2017  Hector Morris was seen today in follow-up.  He underwent a nuclear stress test which was negative for ischemia.  He had normal LV function.  He had a lipid profile which showed a total cholesterol 172, triglycerides 181, HDL 36, LDL 100.  He is on simvastatin however based on his higher dose cannot have an up titration based on FDA limitations.  Goal LDL is less than 70.  10/20/2017  Hector Morris returns today for follow-up.  Over the past month he is done well.  He successfully underwent AAA repair using a stent graft by  Dr. Trula Slade.  This was uncomplicated without endoleak.  He said it was almost uneventful.  In addition we have improved his lipid readings.  His most recent cholesterol profile showed total cholesterol 129, triglycerides 148, HDL 32 and LDL 67, at goal LDL.  He also brought a list of blood pressures, as we started him on low-dose lisinopril.  In general his blood pressure ranges between 244-010 systolic fairly routinely.  PMHx:  Past Medical History:  Diagnosis Date  . Allergy   . Anxiety   . Arthritis   . Dyspnea    with exertion  . GERD (gastroesophageal reflux disease)   . History of hiatal hernia   . History of kidney stones   . Hx of colonic polyps 03/31/2011  . Hyperlipidemia   . Neuromuscular disorder ([place])    hiatal hernia  . Sleep apnea    cpap    Past Surgical History:  Procedure Laterality Date  . ABDOMINAL AORTIC ENDOVASCULAR STENT GRAFT N/A 04/21/2017   Procedure: ABDOMINAL AORTIC ENDOVASCULAR STENT GRAFT;  Surgeon: Serafina Mitchell, MD;  Location: [place];  Service: Vascular;  Laterality: N/A;  . APPENDECTOMY    . COLONOSCOPY    . ELBOW SURGERY Left    as teenager  . HERNIA REPAIR Right    inguinal  . KNEE SURGERY    . POLYPECTOMY    . TONSILLECTOMY      FAMHx:  Family History  Problem Relation Age of  Onset  . Heart murmur Mother   . Heart attack Father   . Stroke Sister   . Obesity Brother   . Colon cancer Neg Hx   . Colon polyps Neg Hx     SOCHx:   reports that he quit smoking about 11 years ago. His smoking use included cigarettes. He has a 58.50 pack-year smoking history. He has quit using smokeless tobacco.  His smokeless tobacco use included chew. He reports that he drinks alcohol. He reports that he does not use drugs.  ALLERGIES:  Allergies  Allergen Reactions  . Bee Venom Anaphylaxis and Swelling  . Penicillins Anaphylaxis and Other (See Comments)    "Mushroom" popped up on hip Has patient had a PCN reaction causing immediate rash,  facial/tongue/throat swelling, SOB or lightheadedness with hypotension: Yes Has patient had a PCN reaction causing severe rash involving mucus membranes or skin necrosis: Yes Has patient had a PCN reaction that required hospitalization: Yes - In hospital Has patient had a PCN reaction occurring within the last 10 years: No If all of the above answers are "NO", then may proceed with Cephalosporin use.   . Shellfish Allergy Hives and Swelling    CRAB MEAT > WHELPS  . Latex     UNSPECIFIED REACTION     ROS: Pertinent items noted in HPI and remainder of comprehensive ROS otherwise negative.  HOME MEDS: Current Outpatient Medications on File Prior to Visit  Medication Sig Dispense Refill  . aspirin 81 MG chewable tablet Chew 81 mg by mouth daily.    . capsaicin (ZOSTRIX) 0.025 % cream Apply 1 application topically 2 (two) times daily as needed (for feet).    . carvedilol (COREG) 6.25 MG tablet Take 6.25 mg by mouth 2 (two) times daily with a meal.    . lisinopril (PRINIVIL,ZESTRIL) 20 MG tablet Take 0.5 tablets (10 mg total) by mouth daily. 45 tablet 3  . metoCLOPramide (REGLAN) 10 MG tablet Take 10 mg by mouth daily as needed for nausea or vomiting.    Marland Kitchen omeprazole (PRILOSEC) 20 MG capsule Take 20 mg by mouth daily.    Marland Kitchen oxyCODONE-acetaminophen (PERCOCET/ROXICET) 5-325 MG tablet Take 1 tablet by mouth every 6 (six) hours as needed for moderate pain. 8 tablet 0  . PARoxetine (PAXIL) 40 MG tablet Take 20 mg by mouth daily.    . rosuvastatin (CRESTOR) 40 MG tablet Take 1 tablet (40 mg total) by mouth daily. 90 tablet 3   No current facility-administered medications on file prior to visit.     LABS/IMAGING: No results found for this or any previous visit (from the past 48 hour(s)). No results found.  LIPID PANEL:    Component Value Date/Time   CHOL 129 06/28/2017 0823   TRIG 148 06/28/2017 0823   HDL 32 (L) 06/28/2017 0823   CHOLHDL 4.0 06/28/2017 0823   LDLCALC 67 06/28/2017 0823      WEIGHTS: Wt Readings from Last 3 Encounters:  10/20/17 215 lb 3.2 oz (97.6 kg)  05/24/17 222 lb (100.7 kg)  04/21/17 222 lb 3.2 oz (100.8 kg)    VITALS: BP 132/62   Pulse 63   Ht 5\' 10"  (1.778 m)   Wt 215 lb 3.2 oz (97.6 kg)   BMI 30.88 kg/m   EXAM: General appearance: alert and no distress Neck: no carotid bruit, no JVD and thyroid not enlarged, symmetric, no tenderness/mass/nodules Lungs: clear to auscultation bilaterally Heart: regular rate and rhythm, S1, S2 normal, no murmur, click, rub or  gallop Abdomen: soft, non-tender; bowel sounds normal; no masses,  no organomegaly Extremities: extremities normal, atraumatic, no cyanosis or edema Pulses: 2+ and symmetric Skin: Skin color, texture, turgor normal. No rashes or lesions Neurologic: Grossly normal Psych: Pleasant  EKG: Sinus rhythm 63-personally reviewed  ASSESSMENT: 1. Coronary artery disease with calcification and aortic atherosclerosis 2. 4.3 cm ascending thoracic aortic aneurysm (01/2017) 3. 5.0 cm infrarenal abdominal aortic aneurysm (01/2017) - s/p AAA repair in 03/2017 4. Dyslipidemia 5. Essential hypertension 6. Family history of heart disease 7. OSA on CPAP  PLAN: 1.   Mr. Fecher had successful AAA repair in February 2019.  He does have a dilated a sending aortic aneurysm measuring only 4.3 cm.  I reassured him that this is typically slow growing but that we would repeat a CT angios of the ascending aorta to continue to evaluate it.  His cholesterol is much improved.  His blood pressure is also now at goal.  We will continue his current medications.  Plan to see him back in 6 months or sooner as necessary.  Pixie Casino, MD, [facility], [place] Director of the [facility] Lipid Disorders &  Cardiovascular Risk Reduction Clinic Diplomate of the American Board of Clinical Lipidology Attending Cardiologist  Direct Dial: [phone]  Fax: [phone]  Website:   [email or web address] 10/21/2017, 1:38 PM

## 2017-11-23 ENCOUNTER — Encounter: Payer: Self-pay | Admitting: Family

## 2017-11-23 ENCOUNTER — Ambulatory Visit (HOSPITAL_COMMUNITY)
Admission: RE | Admit: 2017-11-23 | Discharge: 2017-11-23 | Disposition: A | Discharge status: Home / Self Care | Payer: Medicare Other | Source: Ambulatory Visit | Attending: Family | Admitting: Family

## 2017-11-23 ENCOUNTER — Other Ambulatory Visit: Payer: Self-pay

## 2017-11-23 ENCOUNTER — Ambulatory Visit (INDEPENDENT_AMBULATORY_CARE_PROVIDER_SITE_OTHER): Payer: Medicare Other | Admitting: Family

## 2017-11-23 VITALS — BP 129/78 | HR 65 | Temp 97.5°F | Resp 20 | Ht 70.0 in | Wt 211.0 lb

## 2017-11-23 DIAGNOSIS — I714 Abdominal aortic aneurysm, without rupture, unspecified: Secondary | ICD-10-CM

## 2017-11-23 DIAGNOSIS — F4321 Adjustment disorder with depressed mood: Secondary | ICD-10-CM

## 2017-11-23 DIAGNOSIS — Z87891 Personal history of nicotine dependence: Secondary | ICD-10-CM | POA: Diagnosis not present

## 2017-11-23 DIAGNOSIS — I6529 Occlusion and stenosis of unspecified carotid artery: Secondary | ICD-10-CM | POA: Diagnosis not present

## 2017-11-23 DIAGNOSIS — Z95828 Presence of other vascular implants and grafts: Secondary | ICD-10-CM | POA: Diagnosis not present

## 2017-11-23 DIAGNOSIS — E785 Hyperlipidemia, unspecified: Secondary | ICD-10-CM | POA: Diagnosis not present

## 2017-11-23 DIAGNOSIS — Z634 Disappearance and death of family member: Secondary | ICD-10-CM | POA: Diagnosis not present

## 2017-11-23 NOTE — Patient Instructions (Signed)
Before your next abdominal ultrasound:  Avoid gas forming foods and beverages the day before the test.   Take two Extra-Strength Gas-X capsules at bedtime the night before the test. Take another two Extra-Strength Gas-X capsules in the middle of the night if you get up to the restroom, if not, first thing in the morning with water.  Do not chew gum.     

## 2017-11-23 NOTE — Progress Notes (Signed)
[facility] [place]  CC: Follow up s/p Endovascular Repair of Abdominal Aortic Aneurysm    History of Present Illness  Hector Morris is a 72 y.o. (09/07/1945) male who is s/p endovascular repair of an infrarenal abdominal aortic aneurysm on 04/21/2017 by Dr. Trula Slade. The AAA had a maximum diameter of 5.0 cm.  His postoperative course was complicated by exacerbation of the sciatic nerve.  Otherwise he did very well.    Dr. Trula Slade last evaluated pt on 05-24-17. At that time CT scan showed decrease in the aneurysm size from 5.0 down to 4.7 cm. There was no evidence of endoleak.  His aneurysm was getting smaller. He was to follow-up in 6 months with an ultrasound.   His wife accompanies him; they lost their daughter to recurrent pneumonia in September 2019, both are grieving.    He denies back pain, denies abdominal pain.  Pt denies chest pain, denies dyspnea, denies headache.   Pt states he is scheduled for CTA of chest in December 2019 to follow known thoracic aortic aneurysm, he will se Dr. Debara Pickett afterward.   Diabetic: No Tobaccos use: former smoker, quit in 2011, smoked x 42 years   Past Medical History:  Diagnosis Date  . Allergy   . Anxiety   . Arthritis   . Dyspnea    with exertion  . GERD (gastroesophageal reflux disease)   . History of hiatal hernia   . History of kidney stones   . Hx of colonic polyps 03/31/2011  . Hyperlipidemia   . Neuromuscular disorder ([place])    hiatal hernia  . Sleep apnea    cpap   Past Surgical History:  Procedure Laterality Date  . ABDOMINAL AORTIC ENDOVASCULAR STENT GRAFT N/A 04/21/2017   Procedure: ABDOMINAL AORTIC ENDOVASCULAR STENT GRAFT;  Surgeon: Serafina Mitchell, MD;  Location: [place];  Service: Vascular;  Laterality: N/A;  . APPENDECTOMY    . COLONOSCOPY    . ELBOW SURGERY Left    as teenager  . HERNIA REPAIR Right    inguinal  . KNEE SURGERY    . POLYPECTOMY    . TONSILLECTOMY     Social History Social  History   Tobacco Use  . Smoking status: Former Smoker    Packs/day: 1.50    Years: 39.00    Pack years: 58.50    Types: Cigarettes    Last attempt to quit: 02/23/2006    Years since quitting: 11.7  . Smokeless tobacco: Former Systems developer    Types: Chew  Substance Use Topics  . Alcohol use: Yes    Comment: occasional  . Drug use: No   Family History Family History  Problem Relation Age of Onset  . Heart murmur Mother   . Heart attack Father   . Stroke Sister   . Obesity Brother   . Colon cancer Neg Hx   . Colon polyps Neg Hx    Current Outpatient Medications on File Prior to Visit  Medication Sig Dispense Refill  . aspirin 81 MG chewable tablet Chew 81 mg by mouth daily.    . capsaicin (ZOSTRIX) 0.025 % cream Apply 1 application topically 2 (two) times daily as needed (for feet).    . carvedilol (COREG) 6.25 MG tablet Take 6.25 mg by mouth 2 (two) times daily with a meal.    . lisinopril (PRINIVIL,ZESTRIL) 20 MG tablet Take 0.5 tablets (10 mg total) by mouth daily. 45 tablet 3  . metoCLOPramide (REGLAN) 10 MG tablet Take  10 mg by mouth daily as needed for nausea or vomiting.    Marland Kitchen omeprazole (PRILOSEC) 20 MG capsule Take 20 mg by mouth daily.    Marland Kitchen oxyCODONE-acetaminophen (PERCOCET/ROXICET) 5-325 MG tablet Take 1 tablet by mouth every 6 (six) hours as needed for moderate pain. 8 tablet 0  . PARoxetine (PAXIL) 40 MG tablet Take 20 mg by mouth daily.    . rosuvastatin (CRESTOR) 40 MG tablet Take 1 tablet (40 mg total) by mouth daily. 90 tablet 3   No current facility-administered medications on file prior to visit.    Allergies  Allergen Reactions  . Bee Venom Anaphylaxis and Swelling  . Penicillins Anaphylaxis and Other (See Comments)    "Mushroom" popped up on hip Has patient had a PCN reaction causing immediate rash, facial/tongue/throat swelling, SOB or lightheadedness with hypotension: Yes Has patient had a PCN reaction causing severe rash involving mucus membranes or skin  necrosis: Yes Has patient had a PCN reaction that required hospitalization: Yes - In hospital Has patient had a PCN reaction occurring within the last 10 years: No If all of the above answers are "NO", then may proceed with Cephalosporin use.   . Shellfish Allergy Hives and Swelling    CRAB MEAT > WHELPS  . Latex     UNSPECIFIED REACTION      ROS: See HPI for pertinent positives and negatives.  Physical Examination  Vitals:   11/23/17 1011  BP: 129/78  Pulse: 65  Resp: 20  Temp: (!) 97.5 F (36.4 C)  TempSrc: Oral  SpO2: 97%  Weight: 211 lb (95.7 kg)  Height: 5\' 10"  (1.778 m)   Body mass index is 30.28 kg/m.  General: A&O x 3, WD, obese male HEENT: No gross abnormalities  Pulmonary: Sym exp, respirations are non labored, good air movement in all fields CTAB, no rales, rhonchi, or wheezes.  Cardiac: Regular rhythm and rate, no murmur appreciated  Vascular: Vessel Right Left  Radial 2+Palpable 2+Palpable  Carotid  without bruit  without bruit  Aorta Not palpable N/A  Femoral 2+Palpable 2+Palpable  Popliteal Not palpable Not palpable  PT Palpable Palpable  DP Palpable Palpable   Gastrointestinal: soft, NTND, -G/R, - HSM, - palpable masses, - CVAT B. Musculoskeletal: M/S 5/5 throughout, extremities without ischemic changes. Skin: No rashes, no ulcers, no cellulitis.   Neurologic: Pain and light touch intact in extremities, Motor exam as listed above. Psychiatric: Normal thought content, mood appropriate for clinical situation.    DATA  EVAR Duplex  Current (Date: 11-23-17)  AAA sac size: 4.5 cm; Right CIA: 1.5 cm; Left CIA: 1.6 cm  no endoleak detected  CTA Abd/Pelvis Duplex (Date: 05-20-17)  AAA sac size: 4.7 cm   no endoleak detected  Medical Decision Making  Hector Morris is a 72 y.o. male who presents s/p EVAR (Date: 04-21-17).  Pt is asymptomatic with a decrease in sac size from 4.7 cm by CT in march 2019 to 4.5 cm today by duplex. Fortunately  his blood pressure is in good control and he quit tobacco use in 2011.  Dr. Debara Pickett is monitoring his thoracic aortic aneurysm; small ascending thoracic aortic aneurysm at 4.3 cm on 02-16-17 [facility] at [facility].  Grief, recent loss: his daughter died last month; I allowed him and his wife to talk about the course of her illness.    I discussed with the patient the importance of surveillance of the endograft.  The next endograft duplex will be scheduled for 6 months.  The patient will follow up with [place] in 6 months with these studies.  I emphasized the importance of maximal medical management including strict control of blood pressure, blood glucose, and lipid levels, antiplatelet agents, obtaining regular exercise, and cessation of smoking.   Thank you for allowing [place] to participate in this patient's care.  Clemon Chambers, RN, MSN, FNP-C [facility] [place] Office: [phone]  Clinic Physician: Bishop Dublin  11/23/2017, 10:18 AM

## 2018-01-05 ENCOUNTER — Encounter: Payer: Self-pay | Admitting: Family Medicine

## 2018-01-14 DIAGNOSIS — H8113 Benign paroxysmal vertigo, bilateral: Secondary | ICD-10-CM | POA: Diagnosis not present

## 2018-01-19 DIAGNOSIS — Z01812 Encounter for preprocedural laboratory examination: Secondary | ICD-10-CM | POA: Diagnosis not present

## 2018-01-20 LAB — BASIC METABOLIC PANEL
BUN/Creatinine Ratio: 14 (ref 10–24)
BUN: 17 mg/dL (ref 8–27)
CALCIUM: 10.2 mg/dL (ref 8.6–10.2)
CHLORIDE: 102 mmol/L (ref 96–106)
CO2: 24 mmol/L (ref 20–29)
Creatinine, Ser: 1.24 mg/dL (ref 0.76–1.27)
GFR calc Af Amer: 66 mL/min/{1.73_m2} (ref >59)
GFR calc non Af Amer: 57 mL/min/{1.73_m2} — ABNORMAL LOW (ref >59)
GLUCOSE: 92 mg/dL (ref 65–99)
Potassium: 4.5 mmol/L (ref 3.5–5.2)
Sodium: 140 mmol/L (ref 134–144)

## 2018-01-24 ENCOUNTER — Ambulatory Visit (HOSPITAL_COMMUNITY)
Admission: RE | Admit: 2018-01-24 | Discharge: 2018-01-24 | Disposition: A | Discharge status: Home / Self Care | Payer: Medicare Other | Source: Ambulatory Visit | Attending: Internal Medicine | Admitting: Internal Medicine

## 2018-01-24 DIAGNOSIS — I711 Thoracic aortic aneurysm, ruptured: Secondary | ICD-10-CM | POA: Diagnosis not present

## 2018-01-24 DIAGNOSIS — I712 Thoracic aortic aneurysm, without rupture, unspecified: Secondary | ICD-10-CM

## 2018-01-24 MED ORDER — IOPAMIDOL (ISOVUE-370) INJECTION 76%
100.0000 mL | Freq: Once | INTRAVENOUS | Status: AC | PRN
  Administered 2018-01-24: 100 mL via INTRAVENOUS

## 2018-02-02 ENCOUNTER — Other Ambulatory Visit: Payer: Self-pay | Admitting: *Deleted

## 2018-02-02 DIAGNOSIS — Z9889 Other specified postprocedural states: Principal | ICD-10-CM

## 2018-02-02 DIAGNOSIS — I712 Thoracic aortic aneurysm, without rupture, unspecified: Secondary | ICD-10-CM

## 2018-02-02 DIAGNOSIS — Z8679 Personal history of other diseases of the circulatory system: Secondary | ICD-10-CM

## 2018-04-19 ENCOUNTER — Encounter: Payer: Self-pay | Admitting: Internal Medicine

## 2018-04-19 ENCOUNTER — Ambulatory Visit (INDEPENDENT_AMBULATORY_CARE_PROVIDER_SITE_OTHER): Payer: Medicare Other | Admitting: Internal Medicine

## 2018-04-19 VITALS — BP 120/62 | HR 67 | Ht 70.0 in | Wt 220.6 lb

## 2018-04-19 DIAGNOSIS — Z8679 Personal history of other diseases of the circulatory system: Secondary | ICD-10-CM

## 2018-04-19 DIAGNOSIS — I712 Thoracic aortic aneurysm, without rupture, unspecified: Secondary | ICD-10-CM

## 2018-04-19 DIAGNOSIS — I10 Essential (primary) hypertension: Secondary | ICD-10-CM

## 2018-04-19 DIAGNOSIS — E785 Hyperlipidemia, unspecified: Secondary | ICD-10-CM | POA: Diagnosis not present

## 2018-04-19 DIAGNOSIS — Z9889 Other specified postprocedural states: Secondary | ICD-10-CM | POA: Diagnosis not present

## 2018-04-19 NOTE — Progress Notes (Signed)
OFFICE CONSULT NOTE  Chief Complaint:  Routine follow-up  Primary Care Physician: Patient, No Pcp Per  HPI:  Hector Morris is a 73 y.o. male who is being seen today for the evaluation of aneurysm at the request of No ref. provider found. This is a pleasant 73 year old male who lives in [place].  He had a recent fall and he was evaluated by CT scan.  The CT scan performed on 02/16/2017, demonstrated a broken left rib which was managed medically, but incidentally demonstrated a 4.3 cm ascending thoracic aortic aneurysm, atherosclerosis of the thoracic aorta and a 5 cm infrarenal abdominal aortic aneurysm.  Annual follow-up of the ascending aortic aneurysm was recommended and referral to vascular surgery based on ACR consensus guidelines.  In addition, Hector Morris had a CT scan of the left shoulder in 2013 after a humeral fracture.  He was run over by a bull at that time.  CAT scan demonstrated coronary artery atherosclerosis, however he was not aware that he had that.  He does have a history of dyslipidemia and has been on simvastatin 40 mg nightly.  He reports remote cardiac catheterization in 1993 which showed no significant coronary disease.  He says he has had long-standing reflux symptoms although recently has been having some chest discomfort.  Most of the time and seems to improve with Tums or reflux medication and he has a known hiatal hernia.  03/23/2017  Hector Morris was seen today in follow-up.  He underwent a nuclear stress test which was negative for ischemia.  He had normal LV function.  He had a lipid profile which showed a total cholesterol 172, triglycerides 181, HDL 36, LDL 100.  He is on simvastatin however based on his higher dose cannot have an up titration based on FDA limitations.  Goal LDL is less than 70.  10/20/2017  Hector Morris returns today for follow-up.  Over the past month he is done well.  He successfully underwent AAA repair using a stent graft by Dr.  Trula Slade.  This was uncomplicated without endoleak.  He said it was almost uneventful.  In addition we have improved his lipid readings.  His most recent cholesterol profile showed total cholesterol 129, triglycerides 148, HDL 32 and LDL 67, at goal LDL.  He also brought a list of blood pressures, as we started him on low-dose lisinopril.  In general his blood pressure ranges between 741-638 systolic fairly routinely.  04/19/2018  Hector Morris seen today for routine follow-up.  Overall he is doing well.  His blood pressure is now very well controlled.  His cholesterol is at goal with LDL less than 70.  He denies any chest pain or worsening shortness of breath.  He will need a repeat CT angiogram of thoracic aorta to look at a sending aortic aneurysm.  He has a follow-up ultrasound for endovascular repair of his abdominal aneurysm per vascular surgery in April and has follow-up afterwards.   PMHx:  Past Medical History:  Diagnosis Date  . Allergy   . Anxiety   . Arthritis   . Dyspnea    with exertion  . GERD (gastroesophageal reflux disease)   . History of hiatal hernia   . History of kidney stones   . Hx of colonic polyps 03/31/2011  . Hyperlipidemia   . Neuromuscular disorder ([place])    hiatal hernia  . Sleep apnea    cpap    Past Surgical History:  Procedure Laterality Date  . ABDOMINAL AORTIC  ENDOVASCULAR STENT GRAFT N/A 04/21/2017   Procedure: ABDOMINAL AORTIC ENDOVASCULAR STENT GRAFT;  Surgeon: Serafina Mitchell, MD;  Location: [place] [facility];  Service: Vascular;  Laterality: N/A;  . APPENDECTOMY    . COLONOSCOPY    . ELBOW SURGERY Left    as teenager  . HERNIA REPAIR Right    inguinal  . KNEE SURGERY    . POLYPECTOMY    . TONSILLECTOMY      FAMHx:  Family History  Problem Relation Age of Onset  . Heart murmur Mother   . Heart attack Father   . Stroke Sister   . Obesity Brother   . Colon cancer Neg Hx   . Colon polyps Neg Hx     SOCHx:   reports that he quit smoking about 12  years ago. His smoking use included cigarettes. He has a 58.50 pack-year smoking history. He has quit using smokeless tobacco.  His smokeless tobacco use included chew. He reports current alcohol use. He reports that he does not use drugs.  ALLERGIES:  Allergies  Allergen Reactions  . Bee Venom Anaphylaxis and Swelling  . Penicillins Anaphylaxis and Other (See Comments)    "Mushroom" popped up on hip Has patient had a PCN reaction causing immediate rash, facial/tongue/throat swelling, SOB or lightheadedness with hypotension: Yes Has patient had a PCN reaction causing severe rash involving mucus membranes or skin necrosis: Yes Has patient had a PCN reaction that required hospitalization: Yes - In hospital Has patient had a PCN reaction occurring within the last 10 years: No If all of the above answers are "NO", then may proceed with Cephalosporin use.   . Shellfish Allergy Hives and Swelling    CRAB MEAT > WHELPS  . Latex     UNSPECIFIED REACTION     ROS: Pertinent items noted in HPI and remainder of comprehensive ROS otherwise negative.  HOME MEDS: Current Outpatient Medications on File Prior to Visit  Medication Sig Dispense Refill  . aspirin 81 MG chewable tablet Chew 81 mg by mouth daily.    . capsaicin (ZOSTRIX) 0.025 % cream Apply 1 application topically 2 (two) times daily as needed (for feet).    . carvedilol (COREG) 6.25 MG tablet Take 6.25 mg by mouth 2 (two) times daily with a meal.    . lisinopril (PRINIVIL,ZESTRIL) 20 MG tablet Take 0.5 tablets (10 mg total) by mouth daily. 45 tablet 3  . metoCLOPramide (REGLAN) 10 MG tablet Take 10 mg by mouth daily as needed for nausea or vomiting.    Marland Kitchen omeprazole (PRILOSEC) 20 MG capsule Take 20 mg by mouth daily.    Marland Kitchen oxyCODONE-acetaminophen (PERCOCET/ROXICET) 5-325 MG tablet Take 1 tablet by mouth every 6 (six) hours as needed for moderate pain. 8 tablet 0  . PARoxetine (PAXIL) 40 MG tablet Take 20 mg by mouth daily.    .  rosuvastatin (CRESTOR) 40 MG tablet Take 1 tablet (40 mg total) by mouth daily. 90 tablet 3   No current facility-administered medications on file prior to visit.     LABS/IMAGING: No results found for this or any previous visit (from the past 48 hour(s)). No results found.  LIPID PANEL:    Component Value Date/Time   CHOL 129 06/28/2017 0823   TRIG 148 06/28/2017 0823   HDL 32 (L) 06/28/2017 0823   CHOLHDL 4.0 06/28/2017 0823   LDLCALC 67 06/28/2017 0823    WEIGHTS: Wt Readings from Last 3 Encounters:  04/19/18 220 lb 9.6 oz (100.1 kg)  11/23/17 211 lb (95.7 kg)  10/20/17 215 lb 3.2 oz (97.6 kg)    VITALS: BP 120/62   Pulse 67   Ht 5\' 10"  (1.778 m)   Wt 220 lb 9.6 oz (100.1 kg)   BMI 31.65 kg/m   EXAM: General appearance: alert and no distress Neck: no carotid bruit, no JVD and thyroid not enlarged, symmetric, no tenderness/mass/nodules Lungs: clear to auscultation bilaterally Heart: regular rate and rhythm, S1, S2 normal, no murmur, click, rub or gallop Abdomen: soft, non-tender; bowel sounds normal; no masses,  no organomegaly Extremities: extremities normal, atraumatic, no cyanosis or edema Pulses: 2+ and symmetric Skin: Skin color, texture, turgor normal. No rashes or lesions Neurologic: Grossly normal Psych: Pleasant  EKG: Sinus rhythm with sinus arrhythmia 67-personally reviewed  ASSESSMENT: 1. Coronary artery disease with calcification and aortic atherosclerosis 2. 4.3 cm ascending thoracic aortic aneurysm (01/2017) 3. 5.0 cm infrarenal abdominal aortic aneurysm (01/2017) - s/p AAA repair in 03/2017 4. Dyslipidemia 5. Essential hypertension 6. Family history of heart disease 7. OSA on CPAP  PLAN: 1.   Hector Morris is doing well and is asymptomatic.  He will need a repeat CT angiogram of his aorta which is scheduled in the later part of this year.  Follow-up with me afterwards.  In addition he has an abdominal aortic aneurysm which was repaired in 2019.   This is followed by vascular surgery.  His cholesterol is at goal with LDL less than 70 his blood pressure is also at goal.  He is compliant with CPAP.  Overall is doing well and is asymptomatic.  Follow-up with me annually or sooner as necessary.  Pixie Casino, MD, [facility], [place] Director of the [facility]  [facility] Diplomate of the American Board of Clinical Lipidology Attending Cardiologist  Direct Dial: [phone]  Fax: [phone]  Website:  [email or web address] 04/19/2018, 1:35 PM

## 2018-04-19 NOTE — Patient Instructions (Signed)
Medication Instructions:  Continue same medications If you need a refill on your cardiac medications before your next appointment, please call your pharmacy.   Lab work: None ordered   Testing/Procedures: Chest CT have done in December  Follow-Up: At [facility], you and your health needs are our priority.  As part of our continuing mission to provide you with exceptional heart care, we have created designated Provider Care Teams.  These Care Teams include your primary Cardiologist (physician) and Advanced Practice Providers (APPs -  Physician Assistants and Nurse Practitioners) who all work together to provide you with the care you need, when you need it. . Follow up with Dr.Hilty in 12 months  Call 3 months before to schedule

## 2018-05-25 ENCOUNTER — Other Ambulatory Visit (HOSPITAL_COMMUNITY): Payer: Medicare Other

## 2018-05-25 ENCOUNTER — Ambulatory Visit: Payer: Medicare Other | Admitting: Family

## 2018-05-27 ENCOUNTER — Other Ambulatory Visit (HOSPITAL_COMMUNITY): Payer: Medicare Other

## 2018-05-27 ENCOUNTER — Ambulatory Visit: Payer: Medicare Other | Admitting: Family

## 2018-07-21 ENCOUNTER — Other Ambulatory Visit: Payer: Self-pay

## 2018-07-21 ENCOUNTER — Telehealth: Payer: Self-pay

## 2018-07-21 ENCOUNTER — Emergency Department (HOSPITAL_COMMUNITY): Payer: Medicare Other

## 2018-07-21 ENCOUNTER — Emergency Department (HOSPITAL_COMMUNITY)
Admission: EM | Admit: 2018-07-21 | Discharge: 2018-07-21 | Disposition: A | Discharge status: Home / Self Care | Payer: Medicare Other | Attending: Emergency Medicine | Admitting: Emergency Medicine

## 2018-07-21 ENCOUNTER — Encounter (HOSPITAL_COMMUNITY): Payer: Self-pay | Admitting: Emergency Medicine

## 2018-07-21 DIAGNOSIS — Z87891 Personal history of nicotine dependence: Secondary | ICD-10-CM | POA: Insufficient documentation

## 2018-07-21 DIAGNOSIS — E785 Hyperlipidemia, unspecified: Secondary | ICD-10-CM | POA: Insufficient documentation

## 2018-07-21 DIAGNOSIS — R1032 Left lower quadrant pain: Secondary | ICD-10-CM | POA: Diagnosis not present

## 2018-07-21 DIAGNOSIS — R1030 Lower abdominal pain, unspecified: Secondary | ICD-10-CM | POA: Diagnosis not present

## 2018-07-21 DIAGNOSIS — K5790 Diverticulosis of intestine, part unspecified, without perforation or abscess without bleeding: Secondary | ICD-10-CM | POA: Diagnosis not present

## 2018-07-21 DIAGNOSIS — Z7982 Long term (current) use of aspirin: Secondary | ICD-10-CM | POA: Diagnosis not present

## 2018-07-21 DIAGNOSIS — Z79899 Other long term (current) drug therapy: Secondary | ICD-10-CM | POA: Diagnosis not present

## 2018-07-21 LAB — CBC WITH DIFFERENTIAL/PLATELET
Abs Immature Granulocytes: 0.02 10*3/uL (ref 0.00–0.07)
Basophils Absolute: 0.1 10*3/uL (ref 0.0–0.1)
Basophils Relative: 1 %
Eosinophils Absolute: 0.4 10*3/uL (ref 0.0–0.5)
Eosinophils Relative: 4 %
HCT: 36.9 % — ABNORMAL LOW (ref 39.0–52.0)
Hemoglobin: 12.4 g/dL — ABNORMAL LOW (ref 13.0–17.0)
Immature Granulocytes: 0 %
Lymphocytes Relative: 18 %
Lymphs Abs: 1.8 10*3/uL (ref 0.7–4.0)
MCH: 30.5 pg (ref 26.0–34.0)
MCHC: 33.6 g/dL (ref 30.0–36.0)
MCV: 90.9 fL (ref 80.0–100.0)
Monocytes Absolute: 1.1 10*3/uL — ABNORMAL HIGH (ref 0.1–1.0)
Monocytes Relative: 11 %
Neutro Abs: 6.8 10*3/uL (ref 1.7–7.7)
Neutrophils Relative %: 66 %
Platelets: 290 10*3/uL (ref 150–400)
RBC: 4.06 MIL/uL — ABNORMAL LOW (ref 4.22–5.81)
RDW: 12.5 % (ref 11.5–15.5)
WBC: 10.2 10*3/uL (ref 4.0–10.5)
nRBC: 0 % (ref 0.0–0.2)

## 2018-07-21 LAB — BASIC METABOLIC PANEL
Anion gap: 10 (ref 5–15)
BUN: 16 mg/dL (ref 8–23)
CO2: 24 mmol/L (ref 22–32)
Calcium: 9.1 mg/dL (ref 8.9–10.3)
Chloride: 105 mmol/L (ref 98–111)
Creatinine, Ser: 1.3 mg/dL — ABNORMAL HIGH (ref 0.61–1.24)
GFR calc Af Amer: >60 mL/min (ref >60)
GFR calc non Af Amer: 54 mL/min — ABNORMAL LOW (ref >60)
Glucose, Bld: 103 mg/dL — ABNORMAL HIGH (ref 70–99)
Potassium: 3.4 mmol/L — ABNORMAL LOW (ref 3.5–5.1)
Sodium: 139 mmol/L (ref 135–145)

## 2018-07-21 LAB — URINALYSIS, ROUTINE W REFLEX MICROSCOPIC
Bilirubin Urine: NEGATIVE
Glucose, UA: NEGATIVE mg/dL
Hgb urine dipstick: NEGATIVE
Ketones, ur: NEGATIVE mg/dL
Leukocytes,Ua: NEGATIVE
Nitrite: NEGATIVE
Protein, ur: NEGATIVE mg/dL
Specific Gravity, Urine: 1.02 (ref 1.005–1.030)
pH: 5 (ref 5.0–8.0)

## 2018-07-21 MED ORDER — IOHEXOL 350 MG/ML SOLN
100.0000 mL | Freq: Once | INTRAVENOUS | Status: AC | PRN
  Administered 2018-07-21: 22:00:00 100 mL via INTRAVENOUS

## 2018-07-21 MED ORDER — SODIUM CHLORIDE 0.9 % IV SOLN
INTRAVENOUS | Status: DC
  Administered 2018-07-21: 19:00:00 via INTRAVENOUS

## 2018-07-21 MED ORDER — HYDROMORPHONE HCL 1 MG/ML IJ SOLN
0.7500 mg | Freq: Once | INTRAMUSCULAR | Status: AC
  Administered 2018-07-21: 19:00:00 0.75 mg via INTRAVENOUS
  Filled 2018-07-21: qty 1

## 2018-07-21 NOTE — ED Notes (Signed)
Pt reminded of need for urine specimen. Pt states unable to go at this time.

## 2018-07-21 NOTE — ED Triage Notes (Signed)
Patient states that he has groin pain on the left side that began yesterday. He states he had a aneurism repair in march of 2020 with involement at the same site.

## 2018-07-21 NOTE — ED Provider Notes (Signed)
[facility] EMERGENCY DEPARTMENT Provider Note   CSN: [id number] Arrival date & time: 07/21/18  1654    History   Chief Complaint Chief Complaint  Patient presents with   Groin Pain    HPI Hector Morris is a 73 y.o. male.     HPI   72yM with L groin/LLQ pain. Gradual onset yesterday evening. Did yard work yesterday but doesn't remember any specific trauma/strain. The pain is constant. Worse with movement. Doesn't radiate. No n/v. No urinary complaints. No fever or chills. Hx of endovascular AAA repair earlier this year and is concerned his pain may be related. Denies flank or back pain. No dizziness or lightheadedness.   Past Medical History:  Diagnosis Date   Allergy    Anxiety    Arthritis    Dyspnea    with exertion   GERD (gastroesophageal reflux disease)    History of hiatal hernia    History of kidney stones    Hx of colonic polyps 03/31/2011   Hyperlipidemia    Neuromuscular disorder ([facility])    hiatal hernia   Sleep apnea    cpap    Patient Active Problem List   Diagnosis Date Noted   S/P AAA repair using bifurcation graft 04/21/2017   Hyperlipidemia LDL goal <70 03/23/2017   Preoperative cardiovascular examination 03/08/2017   Coronary artery calcification 03/08/2017   Thoracic aortic aneurysm without rupture ([place]) 03/08/2017   AAA (abdominal aortic aneurysm) without rupture ([place]) 03/08/2017   Hx of colonic polyps 03/31/2011    Past Surgical History:  Procedure Laterality Date   ABDOMINAL AORTIC ENDOVASCULAR STENT GRAFT N/A 04/21/2017   Procedure: ABDOMINAL AORTIC ENDOVASCULAR STENT GRAFT;  Surgeon: Serafina Mitchell, MD;  Location: [facility] OR;  Service: Vascular;  Laterality: N/A;   APPENDECTOMY     COLONOSCOPY     ELBOW SURGERY Left    as teenager   HERNIA REPAIR Right    inguinal   KNEE SURGERY     POLYPECTOMY     TONSILLECTOMY          Home Medications    Prior to Admission medications   Medication Sig Start Date  End Date Taking? Authorizing Provider  aspirin 81 MG chewable tablet Chew 81 mg by mouth daily.    [provider]  capsaicin (ZOSTRIX) 0.025 % cream Apply 1 application topically 2 (two) times daily as needed (for feet).    [provider]  carvedilol (COREG) 6.25 MG tablet Take 6.25 mg by mouth 2 (two) times daily with a meal.    [provider]  lisinopril (PRINIVIL,ZESTRIL) 20 MG tablet Take 0.5 tablets (10 mg total) by mouth daily. 07/07/17   Hilty, Nadean Corwin, MD  metoCLOPramide (REGLAN) 10 MG tablet Take 10 mg by mouth daily as needed for nausea or vomiting.    [provider]  omeprazole (PRILOSEC) 20 MG capsule Take 20 mg by mouth daily.    [provider]  oxyCODONE-acetaminophen (PERCOCET/ROXICET) 5-325 MG tablet Take 1 tablet by mouth every 6 (six) hours as needed for moderate pain. 04/22/17   Rhyne, Hulen Shouts, PA-C  PARoxetine (PAXIL) 40 MG tablet Take 20 mg by mouth daily.    [provider]  rosuvastatin (CRESTOR) 40 MG tablet Take 1 tablet (40 mg total) by mouth daily. 03/23/17   Pixie Casino, MD    Family History Family History  Problem Relation Age of Onset   Heart murmur Mother    Heart attack Father  Stroke Sister    Obesity Brother    Colon cancer Neg Hx    Colon polyps Neg Hx     Social History Social History   Tobacco Use   Smoking status: Former Smoker    Packs/day: 1.50    Years: 39.00    Pack years: 58.50    Types: Cigarettes    Last attempt to quit: 02/23/2006    Years since quitting: 12.4   Smokeless tobacco: Former Systems developer    Types: Chew  Substance Use Topics   Alcohol use: Yes    Comment: occasional   Drug use: No     Allergies   Bee venom; Penicillins; Shellfish allergy; and Latex   Review of Systems Review of Systems  All systems reviewed and negative, other than as noted in HPI.  Physical Exam Updated Vital Signs BP 140/80 (BP Location: Right Arm)    Pulse 80    Temp  98.1 F (36.7 C) (Oral)    Resp 16    Ht 5\' 10"  (1.778 m)    Wt 99.8 kg    SpO2 99%    BMI 31.57 kg/m   Physical Exam Vitals signs and nursing note reviewed.  Constitutional:      General: He is not in acute distress.    Appearance: He is well-developed.  HENT:     Head: Normocephalic and atraumatic.  Eyes:     General:        Right eye: No discharge.        Left eye: No discharge.     Conjunctiva/sclera: Conjunctivae normal.  Neck:     Musculoskeletal: Neck supple.  Cardiovascular:     Rate and Rhythm: Normal rate and regular rhythm.     Heart sounds: Normal heart sounds. No murmur. No friction rub. No gallop.   Pulmonary:     Effort: Pulmonary effort is normal. No respiratory distress.     Breath sounds: Normal breath sounds.  Abdominal:     General: There is no distension.     Palpations: Abdomen is soft.     Tenderness: There is abdominal tenderness.     Comments: TTP from L inguinal crease into LLQ. I do not appreciate a hernia. No overlying skin changes. No rebound or guarding. Palpable femoral pulses b/l.   Musculoskeletal:        General: No tenderness.  Skin:    General: Skin is warm and dry.  Neurological:     Mental Status: He is alert.  Psychiatric:        Behavior: Behavior normal.        Thought Content: Thought content normal.      ED Treatments / Results  Labs (all labs ordered are listed, but only abnormal results are displayed) Labs Reviewed  CBC WITH DIFFERENTIAL/PLATELET - Abnormal; Notable for the following components:      Result Value   RBC 4.06 (*)    Hemoglobin 12.4 (*)    HCT 36.9 (*)    Monocytes Absolute 1.1 (*)    All other components within normal limits  BASIC METABOLIC PANEL - Abnormal; Notable for the following components:   Potassium 3.4 (*)    Glucose, Bld 103 (*)    Creatinine, Ser 1.30 (*)    GFR calc non Af Amer 54 (*)    All other components within normal limits  URINALYSIS, ROUTINE W REFLEX MICROSCOPIC     EKG None  Radiology No results found.   Ct Angio Abd/pel W  And/or Wo Contrast  Result Date: 07/21/2018 CLINICAL DATA:  Left-sided groin pain, history of prior aneurysm repair EXAM: CTA ABDOMEN AND PELVIS WITHOUT AND WITH CONTRAST TECHNIQUE: Multidetector CT imaging of the abdomen and pelvis was performed using the standard protocol during bolus administration of intravenous contrast. Multiplanar reconstructed images and MIPs were obtained and reviewed to evaluate the vascular anatomy. CONTRAST:  141mL OMNIPAQUE IOHEXOL 350 MG/ML SOLN COMPARISON:  05/20/2017 FINDINGS: VASCULAR Aorta: Thoracic aorta demonstrates atherosclerotic calcifications. Abdominal aortic stent graft is noted in satisfactory position extending into the distal common iliac arteries bilaterally. The aneurysm sac has decreased in size now measuring 4.2 x 4.2 cm. No findings to suggest acute endoleak are identified. The stent graft is widely patent. Celiac: Patent without evidence of aneurysm, dissection, vasculitis or significant stenosis. SMA: Mild atherosclerotic changes are noted without aneurysmal dilatation. Renals: Single renal arteries are noted bilaterally with mild atherosclerotic change. IMA: IMA is visualized via a distal collaterals from the SMA. The origin of the IMA is occluded related to the stent graft. Iliacs: Mild atherosclerotic changes are noted without aneurysmal dilatation. No evidence of pseudoaneurysm is seen in the groins. Veins: No definitive venous abnormality is noted. Review of the MIP images confirms the above findings. NON-VASCULAR Lower chest: Lung bases demonstrate no focal infiltrate or sizable effusion. Hepatobiliary: No focal liver abnormality is seen. Status post cholecystectomy. No biliary dilatation. Pancreas: Unremarkable. No pancreatic ductal dilatation or surrounding inflammatory changes. Spleen: Normal in size without focal abnormality. Adrenals/Urinary Tract: Adrenal glands are within normal  limits. Left kidney demonstrates a normal enhancement pattern. 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 Some scarring right kidney is noted related to decreased flow similar to that seen on the prior exam. No obstructive changes are seen. No definitive renal calculi are noted. Bladder is well distended. Stomach/Bowel: Scattered diverticular change of the colon is noted. No diverticulitis is seen. No obstructive changes are noted. The appendix is not well visualized consistent with a prior surgical history. Small bowel is unremarkable. Lymphatic: No specific lymphadenopathy is noted. Reproductive: Prostate is unremarkable. Other: No abdominal wall hernia or abnormality. No abdominopelvic ascites. Musculoskeletal: Degenerative changes of lumbar spine are noted. IMPRESSION: VASCULAR Abdominal aortic stent graft identified in satisfactory position. No endoleak is noted. The aneurysm sac has decreased from the prior exam now measuring 4.2 cm in greatest dimension. NON-VASCULAR Scarring in the right kidney consistent with partial hypoperfusion. Diverticulosis without diverticulitis. No other focal abnormality is seen. Electronically Signed   By: Inez Catalina M.D.   On: 07/21/2018 22:03    Procedures Procedures (including critical care time)  Medications Ordered in ED Medications  0.9 %  sodium chloride infusion ( Intravenous New Bag/Given 07/21/18 1909)  HYDROmorphone (DILAUDID) injection 0.75 mg (0.75 mg Intravenous Given 07/21/18 1909)     Initial Impression / Assessment and Plan / ED Course  I have reviewed the triage vital signs and the nursing notes.  Pertinent labs & imaging results that were available during my care of the patient were reviewed by me and considered in my medical decision making (see chart for details).  72yM with L inguinal/LLQ pain. I do not feel a hernia. He is concerned about his repaired AAA. This is a consideration but I think less likely. He is HD stable. No flank/back pain.  He reports he has been told he has diverticulosis on prior colonoscopy. This is consideration as well. Will check basic labs/UA. CTa of A/P. Pain meds and reassessment.   Final Clinical Impressions(s) /  ED Diagnoses   Final diagnoses:  Lower abdominal pain    ED Discharge Orders    None       Virgel Manifold, MD 07/25/18 1504

## 2018-07-21 NOTE — Discharge Instructions (Addendum)
CT scan showed no worrisome findings.  Follow-up with your primary care doctor or return if worse.

## 2018-07-21 NOTE — ED Provider Notes (Signed)
Patient rechecked prior to discharge.  No acute abdomen.  Described results of tests.   Nat Christen, MD 07/21/18 2252

## 2018-07-21 NOTE — Telephone Encounter (Signed)
Pt called and left VM that he would like to get an appt because he is having left side pain and he is worried that his aneurysm may be leaking.   Called pt back and he said that the pain that he is having is going from his groin area to his left belt line. Advised him that he should contact his PCP as he may have issues that are not vascular related per his description after speaking with Zigmund Daniel.   Pt said that he did not have a PCP and only sees the [place] and they were closed now. He said that he was hoping just to drive to [facility] and see [place] today. Advised that our office was now closed and he said that he will go to the ER for evaluation.  Advised if he is in discomfort and concerned that I thought that would be a good idea and if they needed a vascular consult they would reach out to our doctors.   York Cerise, CMA

## 2018-08-01 DIAGNOSIS — M1711 Unilateral primary osteoarthritis, right knee: Secondary | ICD-10-CM | POA: Diagnosis not present

## 2018-08-22 DIAGNOSIS — M1711 Unilateral primary osteoarthritis, right knee: Secondary | ICD-10-CM | POA: Diagnosis not present

## 2018-08-22 DIAGNOSIS — M25561 Pain in right knee: Secondary | ICD-10-CM | POA: Diagnosis not present

## 2018-08-22 DIAGNOSIS — M25461 Effusion, right knee: Secondary | ICD-10-CM | POA: Diagnosis not present

## 2018-08-23 DIAGNOSIS — M1711 Unilateral primary osteoarthritis, right knee: Secondary | ICD-10-CM | POA: Diagnosis not present

## 2018-09-20 DIAGNOSIS — M1711 Unilateral primary osteoarthritis, right knee: Secondary | ICD-10-CM | POA: Diagnosis not present

## 2018-10-04 ENCOUNTER — Telehealth: Payer: Self-pay | Admitting: *Deleted

## 2018-10-04 NOTE — Telephone Encounter (Signed)
Called patient- spoke to wife and scheduled a NP appt.  Spoke to Mrs. Shepperson at [employer] to let her know that pt states he does not plan on having surgery so doesn't need a clearance appt.

## 2018-10-04 NOTE — Telephone Encounter (Signed)
Copied from [place] [id number]. Topic: Appointment Scheduling - Scheduling Inquiry for Clinic >> Sep 27, 2018  9:22 AM Richardo Priest, [place] wrote: Reason for CRM: Ms.Shepperson called in wanting to making this patient a new patient with Dr.[name] as he is going to be having surgery and does not have a PCP. Patient's wife is establish with Dr.[name]. Please advise and call patient to set up new patient appointment. Ms.Shepperson would also like a message sent to her through voicemail that an appointment has been scheduled so they can continue with patient. Information has been faxed over to office. Please call Ms.Shepperson back at [phone]. Please advise >> Oct 04, 2018  1:43 PM Nils Flack wrote: [place] called to check to see if this pt can have appt for new pt and surgery clearance  Please call back  >> Sep 30, 2018 10:04 AM Percell Belt A wrote: Charissa Bash called back, wanted to know if there was anyway this pt could have a in office visit.  They are not sure how he could have a Cardiac Clearance virtually.  Please advise   Best number  -[phone] / sherry  >> Sep 29, 2018  2:08 PM Suggs, Tammy E wrote: Spoke to patient's wife about making a new pt appt.  She stated she is not interested in making Mr Satira Mccallum an appt right now due to virtual visits. >> Sep 29, 2018 12:06 PM Richardo Priest, NT wrote: [place] called in checking on status of making this patient a new patient appointment. States they sent his records over and they were returned stating he is not a patient. Please advise as they are trying to make him a new patient with a provider and have been waiting.

## 2018-11-08 ENCOUNTER — Encounter (HOSPITAL_COMMUNITY): Payer: Self-pay | Admitting: *Deleted

## 2018-11-08 ENCOUNTER — Other Ambulatory Visit: Payer: Self-pay

## 2018-11-08 ENCOUNTER — Emergency Department (HOSPITAL_COMMUNITY)
Admission: EM | Admit: 2018-11-08 | Discharge: 2018-11-08 | Disposition: A | Discharge status: Home / Self Care | Payer: Medicare Other | Attending: Emergency Medicine | Admitting: Emergency Medicine

## 2018-11-08 DIAGNOSIS — Z7982 Long term (current) use of aspirin: Secondary | ICD-10-CM | POA: Diagnosis not present

## 2018-11-08 DIAGNOSIS — Z91040 Latex allergy status: Secondary | ICD-10-CM | POA: Insufficient documentation

## 2018-11-08 DIAGNOSIS — Z79899 Other long term (current) drug therapy: Secondary | ICD-10-CM | POA: Insufficient documentation

## 2018-11-08 DIAGNOSIS — I712 Thoracic aortic aneurysm, without rupture: Secondary | ICD-10-CM | POA: Diagnosis not present

## 2018-11-08 DIAGNOSIS — M542 Cervicalgia: Secondary | ICD-10-CM | POA: Insufficient documentation

## 2018-11-08 DIAGNOSIS — Z87891 Personal history of nicotine dependence: Secondary | ICD-10-CM | POA: Diagnosis not present

## 2018-11-08 MED ORDER — MELOXICAM 7.5 MG PO TABS: 7.5000 mg | ORAL_TABLET | Freq: Two times a day (BID) | ORAL | 0 refills | Status: AC

## 2018-11-08 MED ORDER — DEXAMETHASONE SODIUM PHOSPHATE 10 MG/ML IJ SOLN
10.0000 mg | Freq: Once | INTRAMUSCULAR | Status: AC
  Administered 2018-11-08: 10 mg via INTRAMUSCULAR
  Filled 2018-11-08: qty 1

## 2018-11-08 MED ORDER — METHOCARBAMOL 500 MG PO TABS: 500.0000 mg | ORAL_TABLET | Freq: Two times a day (BID) | ORAL | 0 refills | Status: DC

## 2018-11-08 NOTE — Discharge Instructions (Addendum)
Please take medications as prescribed.  Return to ED immediately should you develop any fever, chills, or focal neurologic deficits.  Please see your ENT for your appointment tomorrow morning, as planned.

## 2018-11-08 NOTE — ED Triage Notes (Signed)
States he is unable to move his neck without severe pain, states is started yesterday on eht left side and is worse today.

## 2018-11-08 NOTE — ED Provider Notes (Signed)
The patient is a 73 year old male, he has a prior history of recurrent torticollis when he was younger requiring steroid shots by his orthopedist.  He also has a history of several weeks ago having some muscle spasm in his upper back radiating into his shoulder blades which seem to go away after a pain pill.  He now reports that after having no pain for the last 3 weeks approximately 4 days ago he developed pain in his neck mostly on the left side which was worse with movement and palpation but seems to spread to the right side and seems to radiate up the back of the neck.  He is very stiff and unable to move his head from side to side nor is able to flex or extend.  That being said he describes absolutely no fevers nausea vomiting diarrhea chills chest pain coughing or shortness of breath.  He does have a history of a thoracic aneurysm but states it is about 4.2 to 4.3 cm as seen in December 2019 on angiogram.  He has an abdominal aortic stent that is been placed in the past and as of an angiogram from May 2020 there was no leak or complications.  He denies any abdominal pain or back pain at this time.  On exam the patient is exquisitely tender to the strap muscles as well as the trapezius muscles bilaterally.  He is unwilling to move his head at all.  That being said his neurologic exam is completely normal both arms and legs with very supple joints, soft compartments, normal coordination gait and mental status.  He denies headaches, I doubt that this is related to meningitis, dissection of vascular structures, worsening of his aneurysm or any other complications.  Given his history of torticollis I suspect this is more muscular and he will be given a shot of Decadron, home with Robaxin and anti-inflammatory and close follow-up.  The patient is agreeable, stable for discharge at this time.  Medical screening examination/treatment/procedure(s) were conducted as a shared visit with non-physician practitioner(s)  and myself.  I personally evaluated the patient during the encounter.  Clinical Impression:   Final diagnoses:  Neck pain         Noemi Chapel, MD 11/17/18 [phone]

## 2018-11-08 NOTE — ED Provider Notes (Signed)
[facility] EMERGENCY DEPARTMENT Provider Note   CSN: [id number] Arrival date & time: 11/08/18  1321     History   Chief Complaint Chief Complaint  Patient presents with   Neck Pain    HPI Hector Morris is a 73 y.o. male with PMH significant for a stable, 4.3 cm thoracic aortic aneurysm presents to the ER with progressively worsening neck pain.  Patient reports that he first felt left-sided neck discomfort four days ago and that it quickly progressed to bilateral neck pain and stiffness.  He describes it as a 10 out of 10, sharp, "nail piercing" pain that starts on the side of the neck and proceeds up the back of the neck to the scalp.  He states that when he closes his eyes he sees floaters, denies any blurred vision or other visual deficits.  Denies any recent illness, fever, chills, night sweats, chest pain, shortness of breath, dizziness, lightheadedness, weakness, nausea, vomiting or other neurological deficits.  There was no proceeding trauma.  He has taken Tylenol, with no effect.  He is unaware of any aggravating or relieving factors.  He had spoken with his ENT, but they would not of been able to evaluate him until tomorrow.    HPI  Past Medical History:  Diagnosis Date   Allergy    Anxiety    Arthritis    Dyspnea    with exertion   GERD (gastroesophageal reflux disease)    History of hiatal hernia    History of kidney stones    Hx of colonic polyps 03/31/2011   Hyperlipidemia    Neuromuscular disorder ([place])    hiatal hernia   Sleep apnea    cpap    Patient Active Problem List   Diagnosis Date Noted   S/P AAA repair using bifurcation graft 04/21/2017   Hyperlipidemia LDL goal <70 03/23/2017   Preoperative cardiovascular examination 03/08/2017   Coronary artery calcification 03/08/2017   Thoracic aortic aneurysm without rupture ([place]) 03/08/2017   AAA (abdominal aortic aneurysm) without rupture ([place]) 03/08/2017   Hx of colonic polyps 03/31/2011     Past Surgical History:  Procedure Laterality Date   ABDOMINAL AORTIC ENDOVASCULAR STENT GRAFT N/A 04/21/2017   Procedure: ABDOMINAL AORTIC ENDOVASCULAR STENT GRAFT;  Surgeon: Serafina Mitchell, MD;  Location: [facility] OR;  Service: Vascular;  Laterality: N/A;   APPENDECTOMY     COLONOSCOPY     ELBOW SURGERY Left    as teenager   HERNIA REPAIR Right    inguinal   KNEE SURGERY     POLYPECTOMY     TONSILLECTOMY          Home Medications    Prior to Admission medications   Medication Sig Start Date End Date Taking? Authorizing Provider  aspirin 81 MG chewable tablet Chew 81 mg by mouth daily.    [provider]  capsaicin (ZOSTRIX) 0.025 % cream Apply 1 application topically 2 (two) times daily as needed (for feet).    [provider]  carvedilol (COREG) 6.25 MG tablet Take 6.25 mg by mouth 2 (two) times daily with a meal.    [provider]  lisinopril (PRINIVIL,ZESTRIL) 20 MG tablet Take 0.5 tablets (10 mg total) by mouth daily. 07/07/17   Hilty, Nadean Corwin, MD  meloxicam (MOBIC) 7.5 MG tablet Take 1 tablet (7.5 mg total) by mouth 2 (two) times daily for 10 days. 11/08/18 11/18/18  Corena Herter, PA-C  methocarbamol (ROBAXIN) 500 MG tablet Take 1 tablet (500  mg total) by mouth 2 (two) times daily. 11/08/18   Corena Herter, PA-C  omeprazole (PRILOSEC) 20 MG capsule Take 20 mg by mouth daily.    [provider]  oxyCODONE-acetaminophen (PERCOCET/ROXICET) 5-325 MG tablet Take 1 tablet by mouth every 6 (six) hours as needed for moderate pain. 04/22/17   Rhyne, Hulen Shouts, PA-C  PARoxetine (PAXIL) 40 MG tablet Take 20 mg by mouth daily.    [provider]  rosuvastatin (CRESTOR) 40 MG tablet Take 1 tablet (40 mg total) by mouth daily. 03/23/17   Hilty, Nadean Corwin, MD    Family History Family History  Problem Relation Age of Onset   Heart murmur Mother    Heart attack Father    Stroke Sister    Obesity Brother    Colon cancer Neg  Hx    Colon polyps Neg Hx     Social History Social History   Tobacco Use   Smoking status: Former Smoker    Packs/day: 1.50    Years: 39.00    Pack years: 58.50    Types: Cigarettes    Quit date: 02/23/2006    Years since quitting: 12.7   Smokeless tobacco: Former Systems developer    Types: Chew  Substance Use Topics   Alcohol use: Yes    Comment: occasional   Drug use: No     Allergies   Bee venom, Penicillins, Shellfish allergy, and Latex   Review of Systems Review of Systems  All other systems reviewed and are negative.    Physical Exam Updated Vital Signs BP (!) 141/89 (BP Location: Left Arm)    Pulse 86    Temp 98.1 F (36.7 C) (Oral)    Resp 19    Ht 5\' 10"  (1.778 m)    Wt 100 kg    SpO2 96%    BMI 31.63 kg/m   Physical Exam Constitutional:      Appearance: Normal appearance.  HENT:     Head: Normocephalic and atraumatic.  Eyes:     General: No scleral icterus.    Conjunctiva/sclera: Conjunctivae normal.  Neck:     Comments: Significant tenderness to palpation and withdrawal along the lateral aspect of the neck bilaterally continuing along midline occipital region.  Mild tenderness to palpation anteriorly.  Patient range of motion significantly reduced. Cannot extend neck or rotate side-to-side.   Cardiovascular:     Rate and Rhythm: Normal rate and regular rhythm.  Pulmonary:     Effort: Pulmonary effort is normal.  Musculoskeletal:     Comments: Trapezial and strap muscle tenderness to palpation. Neck pain occurs on predominantly on side away from head movement.  Neurological:     Mental Status: He is alert.     GCS: GCS eye subscore is 4. GCS verbal subscore is 5. GCS motor subscore is 6.  Psychiatric:        Mood and Affect: Mood normal.        Behavior: Behavior normal.        Thought Content: Thought content normal.      ED Treatments / Results  Labs (all labs ordered are listed, but only abnormal results are displayed) Labs Reviewed - No data  to display  EKG None  Radiology No results found.  Procedures Procedures (including critical care time)  Medications Ordered in ED Medications  dexamethasone (DECADRON) injection 10 mg (has no administration in time range)     Initial Impression / Assessment and Plan / ED Course  I  have reviewed the triage vital signs and the nursing notes.  Pertinent labs & imaging results that were available during my care of the patient were reviewed by me and considered in my medical decision making (see chart for details).       Patient's history and physical exam consistent with musculoskeletal neck pain and stiffness.  Patient is afebrile does not report chills, as such do not suspect meningitis.  He denies any focal neurologic deficits.  No visual changes or transient vision loss that would otherwise be concerning for [facility].  Patient tenderness to palpation extends along trapezius muscles away from reported area of pain. His ascending aortic aneurysm is stable as of last year. Denies any chest pain or SOB and do not suspect dissection. Upon passive rotation of neck, patient reported pain most predominantly on side away from head movement, suggesting musculoskeletal etiology. Will treat with decadron here for immediate relief and then will discharge with 10-day course of Mobic and Robaxin.  Instructed patient to follow through with his ENT appointment scheduled for tomorrow morning.  Provided strict return precautions for development of fever, chills, visual changes, or any other focal neurologic deficits.    Final Clinical Impressions(s) / ED Diagnoses   Final diagnoses:  Neck pain    ED Discharge Orders         Ordered    meloxicam (MOBIC) 7.5 MG tablet  2 times daily     11/08/18 1431    methocarbamol (ROBAXIN) 500 MG tablet  2 times daily     11/08/18 1431           Reita Chard 11/08/18 1514    Noemi Chapel, MD 11/17/18 [phone]

## 2018-11-10 NOTE — ED Notes (Signed)
Called to verify that pt knew he had prescriptions.  Left voice mail for pt to call charge RN.

## 2018-11-25 ENCOUNTER — Encounter: Payer: Self-pay | Admitting: Family Medicine

## 2018-11-25 ENCOUNTER — Other Ambulatory Visit: Payer: Self-pay

## 2018-11-25 ENCOUNTER — Ambulatory Visit (INDEPENDENT_AMBULATORY_CARE_PROVIDER_SITE_OTHER): Payer: Medicare Other | Admitting: Family Medicine

## 2018-11-25 VITALS — BP 110/58 | HR 76 | Temp 98.2°F | Ht 70.0 in | Wt 220.4 lb

## 2018-11-25 DIAGNOSIS — Z95828 Presence of other vascular implants and grafts: Secondary | ICD-10-CM | POA: Diagnosis not present

## 2018-11-25 DIAGNOSIS — E876 Hypokalemia: Secondary | ICD-10-CM

## 2018-11-25 DIAGNOSIS — G8929 Other chronic pain: Secondary | ICD-10-CM

## 2018-11-25 DIAGNOSIS — Z131 Encounter for screening for diabetes mellitus: Secondary | ICD-10-CM

## 2018-11-25 DIAGNOSIS — Z86010 Personal history of colonic polyps: Secondary | ICD-10-CM | POA: Diagnosis not present

## 2018-11-25 DIAGNOSIS — R739 Hyperglycemia, unspecified: Secondary | ICD-10-CM

## 2018-11-25 DIAGNOSIS — I712 Thoracic aortic aneurysm, without rupture, unspecified: Secondary | ICD-10-CM

## 2018-11-25 DIAGNOSIS — D649 Anemia, unspecified: Secondary | ICD-10-CM | POA: Diagnosis not present

## 2018-11-25 DIAGNOSIS — E785 Hyperlipidemia, unspecified: Secondary | ICD-10-CM | POA: Diagnosis not present

## 2018-11-25 DIAGNOSIS — E538 Deficiency of other specified B group vitamins: Secondary | ICD-10-CM

## 2018-11-25 DIAGNOSIS — G4733 Obstructive sleep apnea (adult) (pediatric): Secondary | ICD-10-CM | POA: Insufficient documentation

## 2018-11-25 DIAGNOSIS — E039 Hypothyroidism, unspecified: Secondary | ICD-10-CM | POA: Diagnosis not present

## 2018-11-25 DIAGNOSIS — M25561 Pain in right knee: Secondary | ICD-10-CM | POA: Diagnosis not present

## 2018-11-25 DIAGNOSIS — R252 Cramp and spasm: Secondary | ICD-10-CM

## 2018-11-25 DIAGNOSIS — Z8679 Personal history of other diseases of the circulatory system: Secondary | ICD-10-CM

## 2018-11-25 DIAGNOSIS — G629 Polyneuropathy, unspecified: Secondary | ICD-10-CM | POA: Diagnosis not present

## 2018-11-25 DIAGNOSIS — I10 Essential (primary) hypertension: Secondary | ICD-10-CM | POA: Diagnosis not present

## 2018-11-25 DIAGNOSIS — R32 Unspecified urinary incontinence: Secondary | ICD-10-CM | POA: Diagnosis not present

## 2018-11-25 DIAGNOSIS — G4730 Sleep apnea, unspecified: Secondary | ICD-10-CM | POA: Insufficient documentation

## 2018-11-25 NOTE — Progress Notes (Signed)
Hector Morris DOB: 1945-11-29 Encounter date: 11/25/2018  This isa 73 y.o. male who presents to establish care. Chief Complaint  Patient presents with  . Establish Care    History of present illness: Was following at [facility] previously.   Has a lot of cramps in legs at night. Just finished muscle relaxers which helped. Has had leg cramps at night as long as he can remember. New shoes helped as well. Prosthetic shoes he had previously didn't help. Even as youth. Also gets spasms of itching in feet. Thinks some of itching from agent orange.   Had some stiffness in neck/shoulders and got steroid shot and rx which helped. Steroid shot made things better.   Has discharge with urination. Gets a drip after urination. Has been going on 6 months. Not sure if he is emptying bladder completely. At night gets up a couple of times. No other leaking. No dysuria. Some urgency.   Sniffles all the time. Not getting a lot of drainage out.   Hx of AAA/thoracic AAA - follows with vascular. Follows with Dr. Debara Pickett as well.   On paxil for depression: feels like he could drop this perhaps. Wife states he can stay on this. Has been on it long time. Had breakdown in 80's - high stress.   Follows with ortho for knee pain. Also has some shoulder pain.  has seen derm in past, but not full body.   Follows with Dr. Carlean Purl for hx of colon polyps. Per Dr. Celesta Aver note from 05/2016 he suggested repeat in 5 years 05/2021  Past Medical History:  Diagnosis Date  . Allergy   . Anxiety   . Arthritis   . Dyspnea    with exertion  . GERD (gastroesophageal reflux disease)   . History of hiatal hernia   . History of kidney stones   . Hx of colonic polyps 03/31/2011  . Hyperlipidemia   . Neuromuscular disorder ([place])    hiatal hernia  . Sleep apnea    cpap   Past Surgical History:  Procedure Laterality Date  . ABDOMINAL AORTIC ENDOVASCULAR STENT GRAFT N/A 04/21/2017   Procedure: ABDOMINAL AORTIC ENDOVASCULAR STENT  GRAFT;  Surgeon: Serafina Mitchell, MD;  Location: [facility];  Service: Vascular;  Laterality: N/A;  . APPENDECTOMY    . COLONOSCOPY     last 05/2016  . ELBOW SURGERY Left    as teenager  . HERNIA REPAIR Right    inguinal  . KNEE SURGERY Right    scope - cartilage removed  . POLYPECTOMY  2018  . TONSILLECTOMY     Allergies  Allergen Reactions  . Bee Venom Anaphylaxis and Swelling  . Penicillins Anaphylaxis and Other (See Comments)    "Mushroom" popped up on hip Has patient had a PCN reaction causing immediate rash, facial/tongue/throat swelling, SOB or lightheadedness with hypotension: Yes Has patient had a PCN reaction causing severe rash involving mucus membranes or skin necrosis: Yes Has patient had a PCN reaction that required hospitalization: Yes - In hospital Has patient had a PCN reaction occurring within the last 10 years: No If all of the above answers are "NO", then may proceed with Cephalosporin use.   . Shellfish Allergy Hives and Swelling    CRAB MEAT > WHELPS  . Latex     UNSPECIFIED REACTION    Current Meds  Medication Sig  . aspirin 81 MG chewable tablet Chew 81 mg by mouth daily.  . capsaicin (ZOSTRIX) 0.025 % cream Apply 1 application  topically 2 (two) times daily as needed (for feet).  . carvedilol (COREG) 6.25 MG tablet Take 6.25 mg by mouth 2 (two) times daily with a meal.  . lisinopril (PRINIVIL,ZESTRIL) 20 MG tablet Take 0.5 tablets (10 mg total) by mouth daily.  Marland Kitchen omeprazole (PRILOSEC) 20 MG capsule Take 20 mg by mouth daily.  Marland Kitchen PARoxetine (PAXIL) 40 MG tablet Take 20 mg by mouth daily.  . rosuvastatin (CRESTOR) 40 MG tablet Take 1 tablet (40 mg total) by mouth daily.   Social History   Tobacco Use  . Smoking status: Former Smoker    Packs/day: 1.50    Years: 39.00    Pack years: 58.50    Types: Cigarettes    Quit date: 02/23/2006    Years since quitting: 12.7  . Smokeless tobacco: Former Systems developer    Types: Chew  Substance Use Topics  . Alcohol use: Yes     Comment: occasional   Family History  Problem Relation Age of Onset  . Heart murmur Mother   . Heart attack Father   . Stroke Sister   . Obesity Brother   . Colon cancer Neg Hx   . Colon polyps Neg Hx      Review of Systems  Constitutional: Negative for chills, fatigue and fever.  Respiratory: Negative for cough, chest tightness, shortness of breath and wheezing.   Cardiovascular: Negative for chest pain, palpitations and leg swelling.  Gastrointestinal: Negative for abdominal distention, abdominal pain, constipation and diarrhea.  Musculoskeletal: Positive for arthralgias.  Neurological: Positive for dizziness (gets bouts of vertigo) and numbness.    Objective:  BP (!) 110/58 (BP Location: Left Arm, Patient Position: Sitting, Cuff Size: Large)   Pulse 76   Temp 98.2 F (36.8 C) (Temporal)   Ht 5\' 10"  (1.778 m)   Wt 220 lb 6.4 oz (100 kg)   SpO2 96%   BMI 31.62 kg/m   Weight: 220 lb 6.4 oz (100 kg)   BP Readings from Last 3 Encounters:  11/25/18 (!) 110/58  11/08/18 (!) 141/89  07/21/18 134/75   Wt Readings from Last 3 Encounters:  11/25/18 220 lb 6.4 oz (100 kg)  11/08/18 220 lb 7.4 oz (100 kg)  07/21/18 220 lb (99.8 kg)    Physical Exam Constitutional:      General: He is not in acute distress.    Appearance: He is well-developed.  HENT:     Head: Normocephalic and atraumatic.  Cardiovascular:     Rate and Rhythm: Normal rate and regular rhythm.     Heart sounds: Normal heart sounds. No murmur.  Pulmonary:     Effort: Pulmonary effort is normal.     Breath sounds: Normal breath sounds.  Abdominal:     General: Bowel sounds are normal. There is no distension.     Palpations: Abdomen is soft.     Tenderness: There is no abdominal tenderness. There is no guarding.  Skin:    General: Skin is warm and dry.     Comments: Sensory exam of the foot is slightly decreased on right foot toes, but otherwise normal, tested with the monofilament. Good pulses, no  lesions or ulcers, good peripheral pulses. Great toenails are thickened.  Psychiatric:        Judgment: Judgment normal.     Assessment/Plan: 1. Leg cramps This is been chronic for the patient.  He has not had neurologic work-up in the past.  We can start with blood work since this may contribute to some  of his leg cramping or discomfort.  Would certainly consider neurology consult if desired  2. Neuropathy See above.  3. Hypokalemia We will recheck levels.  4. Urinary incontinence, unspecified type Has some leakage after urination.  May be related to incomplete emptying.  We will start with UA. - Urinalysis; Future - Urinalysis  5. Chronic pain of right knee Following with Ortho.  6. Thoracic aortic aneurysm without rupture ([place]) Follows with vascular surgeon and cardiology.  7. Hyperlipidemia LDL goal <70 Follows with cardiology.  On statin. - TSH; Future - Lipid panel; Future - TSH - Lipid panel  8. S/P AAA repair using bifurcation graft Following with vascular surgeon and cardiology.  On Crestor.  9. Hx of colonic polyps Due for repeat colonoscopy in April 2023 due to polyps.  10. Hypertension, unspecified type Controlled.  Continue current medication.  Advised him to check at home since running on lower end today. - Comprehensive metabolic panel; Future - CBC with Differential/Platelet; Future - Comprehensive metabolic panel - CBC with Differential/Platelet  11. Screening for diabetes mellitus - Hemoglobin A1c; Future - Hemoglobin A1c  12. Anemia, unspecified type - Vitamin B12; Future - Ferritin - Iron and TIBC - Vitamin B12  13. Hyperglycemia - Hemoglobin A1c; Future - Hemoglobin A1c  14. B12 deficiency - Vitamin B12; Future - Vitamin B12  15. Hypothyroidism, unspecified type - T3, free; Future - T4, free; Future - T3, free - T4, free  Return for pending blood results.  Micheline Rough, MD   Return for pending blood results.

## 2018-11-26 LAB — URINALYSIS
Bilirubin Urine: NEGATIVE
Glucose, UA: NEGATIVE
Hgb urine dipstick: NEGATIVE
Ketones, ur: NEGATIVE
Leukocytes,Ua: NEGATIVE
Nitrite: NEGATIVE
Protein, ur: NEGATIVE
Specific Gravity, Urine: 1.019 (ref 1.001–1.03)
pH: 5.5 (ref 5.0–8.0)

## 2018-11-28 LAB — CBC WITH DIFFERENTIAL/PLATELET
Absolute Monocytes: 705 cells/uL (ref 200–950)
Basophils Absolute: 97 cells/uL (ref 0–200)
Basophils Relative: 1.2 %
Eosinophils Absolute: 421 cells/uL (ref 15–500)
Eosinophils Relative: 5.2 %
HCT: 37.6 % — ABNORMAL LOW (ref 38.5–50.0)
Hemoglobin: 11.8 g/dL — ABNORMAL LOW (ref 13.2–17.1)
Lymphs Abs: 1515 cells/uL (ref 850–3900)
MCH: 29.9 pg (ref 27.0–33.0)
MCHC: 31.4 g/dL — ABNORMAL LOW (ref 32.0–36.0)
MCV: 95.2 fL (ref 80.0–100.0)
MPV: 13.5 fL — ABNORMAL HIGH (ref 7.5–12.5)
Monocytes Relative: 8.7 %
Neutro Abs: 5362 cells/uL (ref 1500–7800)
Neutrophils Relative %: 66.2 %
Platelets: 241 10*3/uL (ref 140–400)
RBC: 3.95 10*6/uL — ABNORMAL LOW (ref 4.20–5.80)
RDW: 12.9 % (ref 11.0–15.0)
Total Lymphocyte: 18.7 %
WBC: 8.1 10*3/uL (ref 3.8–10.8)

## 2018-11-28 LAB — COMPREHENSIVE METABOLIC PANEL
AG Ratio: 1.5 (calc) (ref 1.0–2.5)
ALT: 13 U/L (ref 9–46)
AST: 19 U/L (ref 10–35)
Albumin: 4 g/dL (ref 3.6–5.1)
Alkaline phosphatase (APISO): 43 U/L (ref 35–144)
BUN: 15 mg/dL (ref 7–25)
CO2: 28 mmol/L (ref 20–32)
Calcium: 9.4 mg/dL (ref 8.6–10.3)
Chloride: 107 mmol/L (ref 98–110)
Creat: 1.02 mg/dL (ref 0.70–1.18)
Globulin: 2.6 g/dL (calc) (ref 1.9–3.7)
Glucose, Bld: 84 mg/dL (ref 65–99)
Potassium: 3.9 mmol/L (ref 3.5–5.3)
Sodium: 144 mmol/L (ref 135–146)
Total Bilirubin: 0.6 mg/dL (ref 0.2–1.2)
Total Protein: 6.6 g/dL (ref 6.1–8.1)

## 2018-11-28 LAB — LIPID PANEL
Cholesterol: 127 mg/dL (ref <200)
HDL: 30 mg/dL — ABNORMAL LOW (ref >=40)
LDL Cholesterol (Calc): 76 mg/dL (calc)
Non-HDL Cholesterol (Calc): 97 mg/dL (calc) (ref <130)
Total CHOL/HDL Ratio: 4.2 (calc) (ref <5.0)
Triglycerides: 129 mg/dL (ref <150)

## 2018-11-28 LAB — HEMOGLOBIN A1C
Hgb A1c MFr Bld: 5.4 % of total Hgb (ref <5.7)
Mean Plasma Glucose: 108 (calc)
eAG (mmol/L): 6 (calc)

## 2018-11-28 LAB — FERRITIN: Ferritin: 242 ng/mL (ref 24–380)

## 2018-11-28 LAB — T3, FREE: T3, Free: 3.1 pg/mL (ref 2.3–4.2)

## 2018-11-28 LAB — T4, FREE: Free T4: 0.9 ng/dL (ref 0.8–1.8)

## 2018-11-28 LAB — IRON, TOTAL/TOTAL IRON BINDING CAP
%SAT: 27 % (calc) (ref 20–48)
Iron: 64 ug/dL (ref 50–180)
TIBC: 236 mcg/dL (calc) — ABNORMAL LOW (ref 250–425)

## 2018-11-28 LAB — TSH: TSH: 5.13 mIU/L — ABNORMAL HIGH (ref 0.40–4.50)

## 2018-11-28 LAB — VITAMIN B12: Vitamin B-12: 394 pg/mL (ref 200–1100)

## 2018-12-05 DIAGNOSIS — M9902 Segmental and somatic dysfunction of thoracic region: Secondary | ICD-10-CM | POA: Diagnosis not present

## 2018-12-05 DIAGNOSIS — M542 Cervicalgia: Secondary | ICD-10-CM | POA: Diagnosis not present

## 2018-12-05 DIAGNOSIS — M9901 Segmental and somatic dysfunction of cervical region: Secondary | ICD-10-CM | POA: Diagnosis not present

## 2018-12-05 DIAGNOSIS — M546 Pain in thoracic spine: Secondary | ICD-10-CM | POA: Diagnosis not present

## 2018-12-06 DIAGNOSIS — M9902 Segmental and somatic dysfunction of thoracic region: Secondary | ICD-10-CM | POA: Diagnosis not present

## 2018-12-06 DIAGNOSIS — M546 Pain in thoracic spine: Secondary | ICD-10-CM | POA: Diagnosis not present

## 2018-12-06 DIAGNOSIS — M9901 Segmental and somatic dysfunction of cervical region: Secondary | ICD-10-CM | POA: Diagnosis not present

## 2018-12-06 DIAGNOSIS — M542 Cervicalgia: Secondary | ICD-10-CM | POA: Diagnosis not present

## 2018-12-07 DIAGNOSIS — M9901 Segmental and somatic dysfunction of cervical region: Secondary | ICD-10-CM | POA: Diagnosis not present

## 2018-12-07 DIAGNOSIS — M546 Pain in thoracic spine: Secondary | ICD-10-CM | POA: Diagnosis not present

## 2018-12-07 DIAGNOSIS — M542 Cervicalgia: Secondary | ICD-10-CM | POA: Diagnosis not present

## 2018-12-07 DIAGNOSIS — M9902 Segmental and somatic dysfunction of thoracic region: Secondary | ICD-10-CM | POA: Diagnosis not present

## 2018-12-08 DIAGNOSIS — M546 Pain in thoracic spine: Secondary | ICD-10-CM | POA: Diagnosis not present

## 2019-02-06 ENCOUNTER — Telehealth: Payer: Self-pay | Admitting: Internal Medicine

## 2019-02-06 ENCOUNTER — Other Ambulatory Visit: Payer: Self-pay | Admitting: Internal Medicine

## 2019-02-06 DIAGNOSIS — I712 Thoracic aortic aneurysm, without rupture, unspecified: Secondary | ICD-10-CM

## 2019-02-06 DIAGNOSIS — Z79899 Other long term (current) drug therapy: Secondary | ICD-10-CM

## 2019-02-06 NOTE — Telephone Encounter (Signed)
Patient states he had lab work done on 12/31/18 and he would like to know if he needs another lab before his CT appointment on 02/27/18.

## 2019-02-06 NOTE — Telephone Encounter (Signed)
I spoke with pt. He had lab work done at [place] on 12/31/18.  I told him it would need to be checked again closer to date of CT as lab work needs to be within 6 weeks of test.  He will have lab work drawn at [facility] in [place].

## 2019-02-22 LAB — BASIC METABOLIC PANEL
BUN/Creatinine Ratio: 13 (ref 10–24)
BUN: 17 mg/dL (ref 8–27)
CO2: 24 mmol/L (ref 20–29)
Calcium: 10 mg/dL (ref 8.6–10.2)
Chloride: 102 mmol/L (ref 96–106)
Creatinine, Ser: 1.29 mg/dL — ABNORMAL HIGH (ref 0.76–1.27)
GFR calc Af Amer: 62 mL/min/{1.73_m2} (ref >59)
GFR calc non Af Amer: 54 mL/min/{1.73_m2} — ABNORMAL LOW (ref >59)
Glucose: 97 mg/dL (ref 65–99)
Potassium: 4.3 mmol/L (ref 3.5–5.2)
Sodium: 139 mmol/L (ref 134–144)

## 2019-02-28 ENCOUNTER — Ambulatory Visit (HOSPITAL_COMMUNITY)
Admission: RE | Admit: 2019-02-28 | Discharge: 2019-02-28 | Disposition: A | Discharge status: Home / Self Care | Payer: Medicare Other | Source: Ambulatory Visit | Attending: Internal Medicine | Admitting: Internal Medicine

## 2019-02-28 ENCOUNTER — Other Ambulatory Visit: Payer: Self-pay

## 2019-02-28 DIAGNOSIS — I712 Thoracic aortic aneurysm, without rupture, unspecified: Secondary | ICD-10-CM

## 2019-02-28 MED ORDER — IOHEXOL 300 MG/ML  SOLN: 100.0000 mL | Freq: Once | INTRAMUSCULAR | Status: DC | PRN

## 2019-02-28 MED ORDER — IOHEXOL 350 MG/ML SOLN
100.0000 mL | Freq: Once | INTRAVENOUS | Status: AC | PRN
  Administered 2019-02-28: 100 mL via INTRAVENOUS

## 2019-03-29 DIAGNOSIS — Z20822 Contact with and (suspected) exposure to covid-19: Secondary | ICD-10-CM | POA: Diagnosis not present

## 2019-03-29 DIAGNOSIS — J069 Acute upper respiratory infection, unspecified: Secondary | ICD-10-CM | POA: Diagnosis not present

## 2019-04-05 ENCOUNTER — Ambulatory Visit: Payer: Medicare Other | Admitting: Internal Medicine

## 2019-05-29 ENCOUNTER — Ambulatory Visit (INDEPENDENT_AMBULATORY_CARE_PROVIDER_SITE_OTHER): Payer: Medicare Other | Admitting: Internal Medicine

## 2019-05-29 ENCOUNTER — Encounter: Payer: Self-pay | Admitting: Internal Medicine

## 2019-05-29 ENCOUNTER — Other Ambulatory Visit: Payer: Self-pay

## 2019-05-29 VITALS — BP 128/76 | HR 73 | Temp 95.9°F | Ht 70.0 in | Wt 223.0 lb

## 2019-05-29 DIAGNOSIS — Z8679 Personal history of other diseases of the circulatory system: Secondary | ICD-10-CM | POA: Diagnosis not present

## 2019-05-29 DIAGNOSIS — Z9889 Other specified postprocedural states: Secondary | ICD-10-CM | POA: Diagnosis not present

## 2019-05-29 DIAGNOSIS — I10 Essential (primary) hypertension: Secondary | ICD-10-CM | POA: Diagnosis not present

## 2019-05-29 DIAGNOSIS — I712 Thoracic aortic aneurysm, without rupture, unspecified: Secondary | ICD-10-CM

## 2019-05-29 DIAGNOSIS — E785 Hyperlipidemia, unspecified: Secondary | ICD-10-CM

## 2019-05-29 NOTE — Patient Instructions (Signed)
Medication Instructions:  Your physician recommends that you continue on your current medications as directed. Please refer to the Current Medication list given to you today. *If you need a refill on your cardiac medications before your next appointment, please call your pharmacy*   Lab Work: BMET prior to CT test in Jan 2022 If you have labs (blood work) drawn today and your tests are completely normal, you will receive your results only by: Marland Kitchen MyChart Message (if you have MyChart) OR . A paper copy in the mail If you have any lab test that is abnormal or we need to change your treatment, we will call you to review the results.   Testing/Procedures: CT angiogram of chest/aorta in Jan 2022 at [place]: At [facility], you and your health needs are our priority.  As part of our continuing mission to provide you with exceptional heart care, we have created designated Provider Care Teams.  These Care Teams include your primary Cardiologist (physician) and Advanced Practice Providers (APPs -  Physician Assistants and Nurse Practitioners) who all work together to provide you with the care you need, when you need it.  We recommend signing up for the patient portal called "MyChart".  Sign up information is provided on this After Visit Summary.  MyChart is used to connect with patients for Virtual Visits (Telemedicine).  Patients are able to view lab/test results, encounter notes, upcoming appointments, etc.  Non-urgent messages can be sent to your provider as well.   To learn more about what you can do with MyChart, go to [email or web address].    Your next appointment:   12 month(s)  The format for your next appointment:   In Person  Provider:   You may see Dr. Lyman Bishop or one of the following Advanced Practice Providers on your designated Care Team:    Almyra Deforest, PA-C  Fabian Sharp, [place] or   Roby Lofts, [place]    Other Instructions

## 2019-05-29 NOTE — Progress Notes (Signed)
OFFICE CONSULT NOTE  Chief Complaint:  Routine follow-up  Primary Care Physician: Caren Macadam, MD  HPI:  Hector Morris is a 74 y.o. male who is being seen today for the evaluation of aneurysm at the request of [name], [name], MD. This is a pleasant 74 year old male who lives in [place].  He had a recent fall and he was evaluated by CT scan.  The CT scan performed on 02/16/2017, demonstrated a broken left rib which was managed medically, but incidentally demonstrated a 4.3 cm ascending thoracic aortic aneurysm, atherosclerosis of the thoracic aorta and a 5 cm infrarenal abdominal aortic aneurysm.  Annual follow-up of the ascending aortic aneurysm was recommended and referral to vascular surgery based on ACR consensus guidelines.  In addition, [name] had a CT scan of the left shoulder in 2013 after a humeral fracture.  He was run over by a bull at that time.  CAT scan demonstrated coronary artery atherosclerosis, however he was not aware that he had that.  He does have a history of dyslipidemia and has been on simvastatin 40 mg nightly.  He reports remote cardiac catheterization in 1993 which showed no significant coronary disease.  He says he has had long-standing reflux symptoms although recently has been having some chest discomfort.  Most of the time and seems to improve with Tums or reflux medication and he has a known hiatal hernia.  03/23/2017  [name] was seen today in follow-up.  He underwent a nuclear stress test which was negative for ischemia.  He had normal LV function.  He had a lipid profile which showed a total cholesterol 172, triglycerides 181, HDL 36, LDL 100.  He is on simvastatin however based on his higher dose cannot have an up titration based on FDA limitations.  Goal LDL is less than 70.  10/20/2017  [name] returns today for follow-up.  Over the past month he is done well.  He successfully underwent AAA repair using a stent graft by Dr.  Trula Slade.  This was uncomplicated without endoleak.  He said it was almost uneventful.  In addition we have improved his lipid readings.  His most recent cholesterol profile showed total cholesterol 129, triglycerides 148, HDL 32 and LDL 67, at goal LDL.  He also brought a list of blood pressures, as we started him on low-dose lisinopril.  In general his blood pressure ranges between 99991111 systolic fairly routinely.  04/19/2018  [name] seen today for routine follow-up.  Overall he is doing well.  His blood pressure is now very well controlled.  His cholesterol is at goal with LDL less than 70.  He denies any chest pain or worsening shortness of breath.  He will need a repeat CT angiogram of thoracic aorta to look at a sending aortic aneurysm.  He has a follow-up ultrasound for endovascular repair of his abdominal aneurysm per vascular surgery in April and has follow-up afterwards.  05/29/2019  [name] returns today for follow-up.  Overall he feels well.  He denies any chest pain or shortness of breath.  He did have a CT angio of the aorta in January 2021 which showed a stable 4.1 cm ascending aortic aneurysm.  He has not followed up with Dr. Trula Slade since 2019 but was seen in the ER apparently last year and had a CT scan of the abdomen which showed a stable AAA repair.  In fact, the aneurysm sac size had decreased from prior study measuring 4.2  cm.  Blood pressures well controlled today 128/76.  His recent LDL was 76.  EKG shows sinus rhythm.   PMHx:  Past Medical History:  Diagnosis Date  . Allergy   . Anxiety   . Arthritis   . Dyspnea    with exertion  . GERD (gastroesophageal reflux disease)   . History of hiatal hernia   . History of kidney stones   . Hx of colonic polyps 03/31/2011  . Hyperlipidemia   . Neuromuscular disorder ([place])    hiatal hernia  . Sleep apnea    cpap    Past Surgical History:  Procedure Laterality Date  . ABDOMINAL AORTIC ENDOVASCULAR STENT GRAFT N/A  04/21/2017   Procedure: ABDOMINAL AORTIC ENDOVASCULAR STENT GRAFT;  Surgeon: Serafina Mitchell, MD;  Location: [facility];  Service: Vascular;  Laterality: N/A;  . APPENDECTOMY    . COLONOSCOPY     last 05/2016  . ELBOW SURGERY Left    as teenager  . HERNIA REPAIR Right    inguinal  . KNEE SURGERY Right    scope - cartilage removed  . POLYPECTOMY  2018  . TONSILLECTOMY      FAMHx:  Family History  Problem Relation Age of Onset  . Heart murmur Mother   . Heart attack Father   . Stroke Sister   . Obesity Brother   . Colon cancer Neg Hx   . Colon polyps Neg Hx     SOCHx:   reports that he quit smoking about 13 years ago. His smoking use included cigarettes. He has a 58.50 pack-year smoking history. He has quit using smokeless tobacco.  His smokeless tobacco use included chew. He reports current alcohol use. He reports that he does not use drugs.  ALLERGIES:  Allergies  Allergen Reactions  . Bee Venom Anaphylaxis and Swelling  . Penicillins Anaphylaxis and Other (See Comments)    "Mushroom" popped up on hip Has patient had a PCN reaction causing immediate rash, facial/tongue/throat swelling, SOB or lightheadedness with hypotension: Yes Has patient had a PCN reaction causing severe rash involving mucus membranes or skin necrosis: Yes Has patient had a PCN reaction that required hospitalization: Yes - In hospital Has patient had a PCN reaction occurring within the last 10 years: No If all of the above answers are "NO", then may proceed with Cephalosporin use.   . Shellfish Allergy Hives and Swelling    CRAB MEAT > WHELPS  . Latex     UNSPECIFIED REACTION     ROS: Pertinent items noted in HPI and remainder of comprehensive ROS otherwise negative.  HOME MEDS: Current Outpatient Medications on File Prior to Visit  Medication Sig Dispense Refill  . aspirin 81 MG chewable tablet Chew 81 mg by mouth daily.    . capsaicin (ZOSTRIX) 0.025 % cream Apply 1 application topically 2 (two)  times daily as needed (for feet).    . carvedilol (COREG) 6.25 MG tablet Take 6.25 mg by mouth 2 (two) times daily with a meal.    . Cholecalciferol (VITAMIN D3) 125 MCG (5000 UT) CAPS Take 5,000 Units by mouth every morning.    Marland Kitchen lisinopril (PRINIVIL,ZESTRIL) 20 MG tablet Take 0.5 tablets (10 mg total) by mouth daily. 45 tablet 3  . MAGNESIUM OXIDE 400 PO Take 400 mg by mouth daily.    Marland Kitchen omeprazole (PRILOSEC) 20 MG capsule Take 20 mg by mouth daily.    Marland Kitchen PARoxetine (PAXIL) 40 MG tablet Take 20 mg by mouth daily.    Marland Kitchen  Potassium 99 MG TABS Take 99 mg by mouth daily.    . rosuvastatin (CRESTOR) 40 MG tablet Take 1 tablet (40 mg total) by mouth daily. 90 tablet 3   No current facility-administered medications on file prior to visit.    LABS/IMAGING: No results found for this or any previous visit (from the past 48 hour(s)). No results found.  LIPID PANEL:    Component Value Date/Time   CHOL 127 11/25/2018 1638   CHOL 129 06/28/2017 0823   TRIG 129 11/25/2018 1638   HDL 30 (L) 11/25/2018 1638   HDL 32 (L) 06/28/2017 0823   CHOLHDL 4.2 11/25/2018 1638   LDLCALC 76 11/25/2018 1638    WEIGHTS: Wt Readings from Last 3 Encounters:  05/29/19 223 lb (101.2 kg)  11/25/18 220 lb 6.4 oz (100 kg)  11/08/18 220 lb 7.4 oz (100 kg)    VITALS: BP 128/76 (BP Location: Left Arm, Patient Position: Sitting, Cuff Size: Normal)   Pulse 73   Temp (!) 95.9 F (35.5 C)   Ht 5\' 10"  (1.778 m)   Wt 223 lb (101.2 kg)   BMI 32.00 kg/m   EXAM: General appearance: alert and no distress Neck: no carotid bruit, no JVD and thyroid not enlarged, symmetric, no tenderness/mass/nodules Lungs: clear to auscultation bilaterally Heart: regular rate and rhythm, S1, S2 normal, no murmur, click, rub or gallop Abdomen: soft, non-tender; bowel sounds normal; no masses,  no organomegaly Extremities: extremities normal, atraumatic, no cyanosis or edema Pulses: 2+ and symmetric Skin: Skin color, texture, turgor  normal. No rashes or lesions Neurologic: Grossly normal Psych: Pleasant  EKG: Normal sinus rhythm 73-personally reviewed  ASSESSMENT: 1. Coronary artery disease with calcification and aortic atherosclerosis 2. 4.1-4.3 cm ascending thoracic aortic aneurysm (02/2019) 3. 5.0 cm infrarenal abdominal aortic aneurysm (01/2017) - s/p AAA repair in 03/2017, stable by CT abdomen in 06/2018 4. Dyslipidemia 5. Essential hypertension 6. Family history of heart disease 7. OSA on CPAP  PLAN: 1.   Mr. Insixiengmay continues to do well and is asymptomatic.  Blood pressures well controlled.  Cholesterol is very near target LDL less than 70.  He is compliant with CPAP.  Does report some fatigue and I assured him I do not think it is medication related.  He has coronary artery disease but has had no chest pain.  His thoracic aortic aneurysm is stable  He also had a CT last year which showed stable AAA repair.  He should follow-up with Dr. Trula Slade as he is overdue for that.  Otherwise plan follow-up with me annually or sooner as necessary.  Pixie Casino, MD, [facility], [place] Director of the [facility] Lipid [facility]  Card[facility] Diplomate of the American Board of Clinical Lipidology Attending Cardiologist  Direct Dial: [phone]  Fax: [phone]  Website:  [email or web address] Osgood Yamir Carignan 05/29/2019, 3:16 PM

## 2019-06-02 IMAGING — NM NM MISC PROCEDURE
9 series · 54 of 54 positions shown · non-contrast
Comparison: none

[Series 1: wbr_r-proj_st wbr rest · 6.40mm/px · 6 of 64 frames shown]
[frame 6/64]
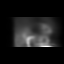
[frame 16/64]
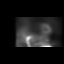
[frame 27/64]
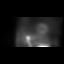
[frame 38/64]
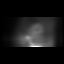
[frame 48/64]
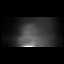
[frame 59/64]
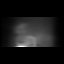

[Series 1: wbr rest · 6.40mm/px · 6 of 64 frames shown]
[frame 6/64]
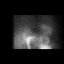
[frame 16/64]
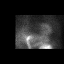
[frame 27/64]
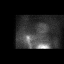
[frame 38/64]
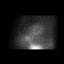
[frame 48/64]
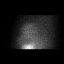
[frame 59/64]
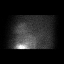

[Series 1: rest sax · 6.4mm · 6.40mm/px · 6 of 23 frames shown]
[frame 2/23]
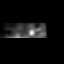
[frame 6/23]
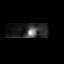
[frame 10/23]
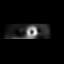
[frame 14/23]
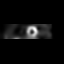
[frame 18/23]
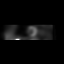
[frame 22/23]
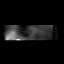

[Series 2: wbr stress-gsp · 6.40mm/px · 6 of 512 frames shown]
[frame 43/512]
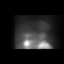
[frame 128/512]
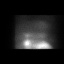
[frame 214/512]
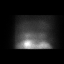
[frame 299/512]
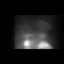
[frame 384/512]
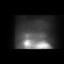
[frame 470/512]
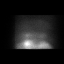

[Series 2: wbr_s-proj_st wbr stress-gsp · 6.40mm/px · 6 of 512 frames shown]
[frame 43/512]
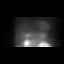
[frame 128/512]
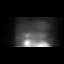
[frame 214/512]
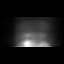
[frame 299/512]
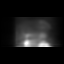
[frame 384/512]
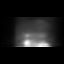
[frame 470/512]
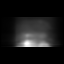

[Series 2: stress sax gs · 6.4mm · 6.40mm/px · 6 of 184 frames shown]
[frame 16/184]
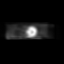
[frame 46/184]
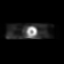
[frame 77/184]
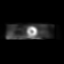
[frame 108/184]
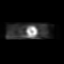
[frame 138/184]
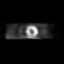
[frame 169/184]
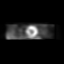

[Series 2: stress sax · 6.4mm · 6.40mm/px · 6 of 23 frames shown]
[frame 2/23]
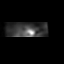
[frame 6/23]
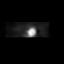
[frame 10/23]
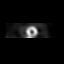
[frame 14/23]
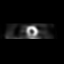
[frame 18/23]
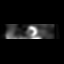
[frame 22/23]
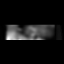

[Series 3: wbr stress-sum-em · 6.40mm/px · 6 of 64 frames shown]
[frame 6/64]
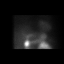
[frame 16/64]
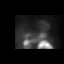
[frame 27/64]
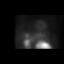
[frame 38/64]
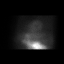
[frame 48/64]
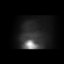
[frame 59/64]
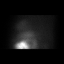

[Series 3: wbr_s-proj_st wbr stress-sum-em · 6.40mm/px · 6 of 64 frames shown]
[frame 6/64]
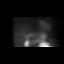
[frame 16/64]
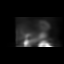
[frame 27/64]
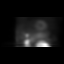
[frame 38/64]
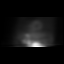
[frame 48/64]
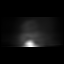
[frame 59/64]
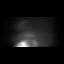

[54 of 54 positions shown; findings below may reference images not displayed]

Canned report from images found in remote index.

Refer to host system for actual result text.

## 2019-07-07 ENCOUNTER — Other Ambulatory Visit: Payer: Self-pay

## 2019-07-07 ENCOUNTER — Emergency Department (HOSPITAL_COMMUNITY)
Admission: EM | Admit: 2019-07-07 | Discharge: 2019-07-07 | Disposition: A | Discharge status: Home / Self Care | Payer: Medicare Other | Attending: Emergency Medicine | Admitting: Emergency Medicine

## 2019-07-07 ENCOUNTER — Emergency Department (HOSPITAL_COMMUNITY): Payer: Medicare Other

## 2019-07-07 ENCOUNTER — Encounter (HOSPITAL_COMMUNITY): Payer: Self-pay | Admitting: *Deleted

## 2019-07-07 DIAGNOSIS — R109 Unspecified abdominal pain: Secondary | ICD-10-CM | POA: Diagnosis not present

## 2019-07-07 DIAGNOSIS — Z87891 Personal history of nicotine dependence: Secondary | ICD-10-CM | POA: Insufficient documentation

## 2019-07-07 DIAGNOSIS — R079 Chest pain, unspecified: Secondary | ICD-10-CM | POA: Diagnosis not present

## 2019-07-07 DIAGNOSIS — Z79899 Other long term (current) drug therapy: Secondary | ICD-10-CM | POA: Diagnosis not present

## 2019-07-07 DIAGNOSIS — Z91040 Latex allergy status: Secondary | ICD-10-CM | POA: Diagnosis not present

## 2019-07-07 DIAGNOSIS — R0789 Other chest pain: Secondary | ICD-10-CM | POA: Insufficient documentation

## 2019-07-07 DIAGNOSIS — Z7982 Long term (current) use of aspirin: Secondary | ICD-10-CM | POA: Insufficient documentation

## 2019-07-07 DIAGNOSIS — R0602 Shortness of breath: Secondary | ICD-10-CM | POA: Diagnosis not present

## 2019-07-07 LAB — BASIC METABOLIC PANEL
Anion gap: 10 (ref 5–15)
BUN: 21 mg/dL (ref 8–23)
CO2: 25 mmol/L (ref 22–32)
Calcium: 9.7 mg/dL (ref 8.9–10.3)
Chloride: 101 mmol/L (ref 98–111)
Creatinine, Ser: 1.32 mg/dL — ABNORMAL HIGH (ref 0.61–1.24)
GFR calc Af Amer: >60 mL/min (ref >60)
GFR calc non Af Amer: 52 mL/min — ABNORMAL LOW (ref >60)
Glucose, Bld: 104 mg/dL — ABNORMAL HIGH (ref 70–99)
Potassium: 4.2 mmol/L (ref 3.5–5.1)
Sodium: 136 mmol/L (ref 135–145)

## 2019-07-07 LAB — CBC
HCT: 41.3 % (ref 39.0–52.0)
Hemoglobin: 13.5 g/dL (ref 13.0–17.0)
MCH: 30 pg (ref 26.0–34.0)
MCHC: 32.7 g/dL (ref 30.0–36.0)
MCV: 91.8 fL (ref 80.0–100.0)
Platelets: 308 10*3/uL (ref 150–400)
RBC: 4.5 MIL/uL (ref 4.22–5.81)
RDW: 12.9 % (ref 11.5–15.5)
WBC: 14.2 10*3/uL — ABNORMAL HIGH (ref 4.0–10.5)
nRBC: 0 % (ref 0.0–0.2)

## 2019-07-07 LAB — TROPONIN I (HIGH SENSITIVITY)
Troponin I (High Sensitivity): 5 ng/L (ref <18)
Troponin I (High Sensitivity): 4 ng/L (ref <18)

## 2019-07-07 MED ORDER — ASPIRIN 81 MG PO CHEW
324.0000 mg | CHEWABLE_TABLET | Freq: Once | ORAL | Status: AC
  Administered 2019-07-07: 324 mg via ORAL
  Filled 2019-07-07: qty 4

## 2019-07-07 MED ORDER — ONDANSETRON HCL 4 MG/2ML IJ SOLN
4.0000 mg | Freq: Once | INTRAMUSCULAR | Status: AC
  Administered 2019-07-07: 4 mg via INTRAVENOUS
  Filled 2019-07-07: qty 2

## 2019-07-07 MED ORDER — SODIUM CHLORIDE 0.9% FLUSH: 3.0000 mL | Freq: Once | INTRAVENOUS | Status: DC

## 2019-07-07 MED ORDER — SODIUM CHLORIDE 0.9 % IV BOLUS
500.0000 mL | Freq: Once | INTRAVENOUS | Status: AC
  Administered 2019-07-07: 500 mL via INTRAVENOUS

## 2019-07-07 MED ORDER — MORPHINE SULFATE (PF) 4 MG/ML IV SOLN
4.0000 mg | Freq: Once | INTRAVENOUS | Status: AC
  Administered 2019-07-07: 4 mg via INTRAVENOUS
  Filled 2019-07-07: qty 1

## 2019-07-07 MED ORDER — IOHEXOL 350 MG/ML SOLN
100.0000 mL | Freq: Once | INTRAVENOUS | Status: AC | PRN
  Administered 2019-07-07: 100 mL via INTRAVENOUS

## 2019-07-07 NOTE — ED Notes (Signed)
Pt gone over to CT 

## 2019-07-07 NOTE — Discharge Instructions (Signed)
Your work-up today is reassuring with no evidence of worsening aneurysm or blood clots in the lungs or heart attack.  You may have what is known as pleurisy which is an inflammation of the sac around the lungs that provides lubrication against to the ribs.  This is typically treated with anti-inflammatory such as ibuprofen.  You can take 400 mg of ibuprofen every 6 hours as needed for pain.  Take this with food to avoid upset stomach issues.   You can also try applying heat or ice whichever feels best to the chest wall 20 minutes at a time a few times daily.  Drink plenty of fluids and get rest.  Follow-up with your primary care provider or cardiologist for reevaluation of your symptoms.  Return to the emergency department if any concerning signs or symptoms develop such as fevers, persistent or worsening chest pain or shortness of breath, loss of consciousness, persistent vomiting.

## 2019-07-07 NOTE — ED Provider Notes (Addendum)
[facility] EMERGENCY DEPARTMENT Provider Note   CSN: [id number] Arrival date & time: 07/07/19  1529     History Chief Complaint  Patient presents with  . Chest Pain    Hector Morris is a 74 y.o. male with history of GERD, hyperlipidemia, thoracic aortic aneurysm, abdominal aortic aneurysm status post repair using bifurcation graft presenting for evaluation of acute onset, persistent and progressively worsening left-sided chest pains. Reports that symptoms began around noon while he was driving. The pain is sharp, waxing and waning, worsens with deep inspiration. He notes associated shortness of breath and nausea and had one episode of nonbloody nonbilious emesis. He denies abdominal pain, fevers, recent travel or surgeries, hemoptysis, or prior history of DVT or PE. He denies lightheadedness, syncope, or diaphoresis. He is a former smoker, quit approximately 15 years ago. He has not tried anything for his symptoms. He is on a baby aspirin daily.  The history is provided by the patient.       Past Medical History:  Diagnosis Date  . Allergy   . Anxiety   . Arthritis   . Dyspnea    with exertion  . GERD (gastroesophageal reflux disease)   . History of hiatal hernia   . History of kidney stones   . Hx of colonic polyps 03/31/2011  . Hyperlipidemia   . Neuromuscular disorder ([place])    hiatal hernia  . Sleep apnea    cpap    Patient Active Problem List   Diagnosis Date Noted  . Right knee pain 11/25/2018  . Sleep apnea 11/25/2018  . S/P AAA repair using bifurcation graft 04/21/2017  . Hyperlipidemia LDL goal <70 03/23/2017  . Preoperative cardiovascular examination 03/08/2017  . Coronary artery calcification 03/08/2017  . Thoracic aortic aneurysm without rupture ([place]) 03/08/2017  . AAA (abdominal aortic aneurysm) without rupture ([place]) 03/08/2017  . Hx of colonic polyps 03/31/2011    Past Surgical History:  Procedure Laterality Date  . ABDOMINAL AORTIC ENDOVASCULAR  STENT GRAFT N/A 04/21/2017   Procedure: ABDOMINAL AORTIC ENDOVASCULAR STENT GRAFT;  Surgeon: Serafina Mitchell, MD;  Location: [place];  Service: Vascular;  Laterality: N/A;  . APPENDECTOMY    . COLONOSCOPY     last 05/2016  . ELBOW SURGERY Left    as teenager  . HERNIA REPAIR Right    inguinal  . KNEE SURGERY Right    scope - cartilage removed  . POLYPECTOMY  2018  . TONSILLECTOMY         Family History  Problem Relation Age of Onset  . Heart murmur Mother   . Heart attack Father   . Stroke Sister   . Obesity Brother   . Colon cancer Neg Hx   . Colon polyps Neg Hx     Social History   Tobacco Use  . Smoking status: Former Smoker    Packs/day: 1.50    Years: 39.00    Pack years: 58.50    Types: Cigarettes    Quit date: 02/23/2006    Years since quitting: 13.3  . Smokeless tobacco: Former Systems developer    Types: Chew  Substance Use Topics  . Alcohol use: Yes    Comment: occasional  . Drug use: No    Home Medications Prior to Admission medications   Medication Sig Start Date End Date Taking? Authorizing Provider  aspirin 81 MG chewable tablet Chew 81 mg by mouth daily.    [provider]  capsaicin (ZOSTRIX) 0.025 % cream Apply 1 application  topically 2 (two) times daily as needed (for feet).    [provider]  carvedilol (COREG) 6.25 MG tablet Take 6.25 mg by mouth 2 (two) times daily with a meal.    [provider]  Cholecalciferol (VITAMIN D3) 125 MCG (5000 UT) CAPS Take 5,000 Units by mouth every morning.    [provider]  lisinopril (PRINIVIL,ZESTRIL) 20 MG tablet Take 0.5 tablets (10 mg total) by mouth daily. 07/07/17   Hilty, Nadean Corwin, MD  MAGNESIUM OXIDE 400 PO Take 400 mg by mouth daily.    [provider]  omeprazole (PRILOSEC) 20 MG capsule Take 20 mg by mouth daily.    [provider]  PARoxetine (PAXIL) 40 MG tablet Take 20 mg by mouth daily.    [provider]  Potassium 99 MG TABS Take 99 mg by  mouth daily.    [provider]  rosuvastatin (CRESTOR) 40 MG tablet Take 1 tablet (40 mg total) by mouth daily. 03/23/17   Hilty, Nadean Corwin, MD    Allergies    Bee venom, Penicillins, Shellfish allergy, and Latex  Review of Systems   Review of Systems  Constitutional: Negative for chills, diaphoresis and fever.  Respiratory: Positive for shortness of breath.   Cardiovascular: Positive for chest pain. Negative for leg swelling.  Gastrointestinal: Positive for nausea and vomiting. Negative for abdominal pain, constipation and diarrhea.  Neurological: Negative for syncope and light-headedness.  All other systems reviewed and are negative.   Physical Exam Updated Vital Signs BP 112/68   Pulse 76   Temp 98.3 F (36.8 C) (Oral)   Resp 20   Ht 5\' 10"  (1.778 m)   Wt 99.8 kg   SpO2 94%   BMI 31.57 kg/m   Physical Exam Vitals and nursing note reviewed.  Constitutional:      General: He is not in acute distress.    Appearance: He is well-developed.  HENT:     Head: Normocephalic and atraumatic.  Eyes:     General:        Right eye: No discharge.        Left eye: No discharge.     Conjunctiva/sclera: Conjunctivae normal.  Neck:     Vascular: No JVD.     Trachea: No tracheal deviation.  Cardiovascular:     Rate and Rhythm: Normal rate and regular rhythm.     Pulses:          Radial pulses are 2+ on the right side and 1+ on the left side.       Dorsalis pedis pulses are 2+ on the right side and 2+ on the left side.       Posterior tibial pulses are 2+ on the right side and 2+ on the left side.     Comments: Homans sign absent bilaterally, no lower extremity edema, no palpable cords, compartments are soft  Pulmonary:     Effort: Pulmonary effort is normal.     Comments: Scattered rales Chest:     Chest wall: No tenderness.  Abdominal:     General: There is no distension.     Palpations: Abdomen is soft.     Tenderness: There is no abdominal tenderness. There is  no guarding or rebound.  Musculoskeletal:     Cervical back: Normal range of motion and neck supple.     Right lower leg: No tenderness. No edema.     Left lower leg: No tenderness. No edema.  Skin:  General: Skin is warm.     Findings: No erythema.  Neurological:     Mental Status: He is alert.  Psychiatric:        Behavior: Behavior normal.     ED Results / Procedures / Treatments   Labs (all labs ordered are listed, but only abnormal results are displayed) Labs Reviewed  BASIC METABOLIC PANEL - Abnormal; Notable for the following components:      Result Value   Glucose, Bld 104 (*)    Creatinine, Ser 1.32 (*)    GFR calc non Af Amer 52 (*)    All other components within normal limits  CBC - Abnormal; Notable for the following components:   WBC 14.2 (*)    All other components within normal limits  TROPONIN I (HIGH SENSITIVITY)  TROPONIN I (HIGH SENSITIVITY)    EKG EKG Interpretation  Date/Time:  Friday Jul 07 2019 16:31:46 EDT Ventricular Rate:  77 PR Interval:    QRS Duration: 92 QT Interval:  410 QTC Calculation: 464 R Axis:   -9 Text Interpretation: Sinus rhythm Abnormal R-wave progression, early transition Confirmed by Milton Ferguson [phone]) on 07/07/2019 6:48:11 PM   Radiology DG Chest Port 1 View  Result Date: 07/07/2019 CLINICAL DATA:  Chest pain and nausea for several hours EXAM: PORTABLE CHEST 1 VIEW COMPARISON:  02/28/2019 CT of the chest FINDINGS: Cardiac shadow is mildly enlarged. Aortic calcifications are again seen. Elevation of the right hemidiaphragm is again noted and stable. Mild interstitial changes are seen bilaterally stable from the prior CT examination. No focal infiltrate or sizable effusion is seen. No acute bony abnormality is noted. IMPRESSION: Chronic interstitial changes without acute abnormality. Electronically Signed   By: Inez Catalina M.D.   On: 07/07/2019 16:21   CT Angio Chest/Abd/Pel for Dissection W and/or Wo Contrast  Result  Date: 07/07/2019 CLINICAL DATA:  Chest pain, shortness of breath EXAM: CT ANGIOGRAPHY CHEST, ABDOMEN AND PELVIS TECHNIQUE: Non-contrast CT of the chest was initially obtained. Multidetector CT imaging through the chest, abdomen and pelvis was performed using the standard protocol during bolus administration of intravenous contrast. Multiplanar reconstructed images and MIPs were obtained and reviewed to evaluate the vascular anatomy. CONTRAST:  155mL OMNIPAQUE IOHEXOL 350 MG/ML SOLN COMPARISON:  02/28/2019 FINDINGS: CTA CHEST FINDINGS Cardiovascular: Mild dilatation of the ascending thoracic aorta, 4.1 cm, stable. Aortic atherosclerosis and coronary artery disease. Heart is normal size. No evidence of aortic aneurysm. Mediastinum/Nodes: No mediastinal, hilar, or axillary adenopathy. Trachea and esophagus are unremarkable. Thyroid unremarkable. Lungs/Pleura: Areas of peripheral scarring/fibrosis in the lungs bilaterally, stable. No confluent opacities or effusions. Musculoskeletal: Chest wall soft tissues are unremarkable. Review of the MIP images confirms the above findings. CTA ABDOMEN AND PELVIS FINDINGS VASCULAR Aorta: Prior stent graft repair of infrarenal abdominal aortic aneurysm. Aneurysm sac size measures up to 3.8 cm. This compares to 4.2 cm previously. No evidence of endoleak. Celiac: Patent SMA: Patent Renals: Probable stenosis at the origin of both renal arteries, patent IMA: Fills via collaterals from the SMA. Inflow: Stent graft continues into the common iliac arteries bilaterally. No aneurysm or dissection. Atherosclerotic disease within the external iliac arteries and common femoral arteries. No dissection. Veins: No obvious venous abnormality within the limitations of this arterial phase study. Review of the MIP images confirms the above findings. NON-VASCULAR Hepatobiliary: No focal liver abnormality is seen. Status post cholecystectomy. No biliary dilatation. Pancreas: No focal abnormality or  ductal dilatation. Spleen: No focal abnormality.  Normal size. Adrenals/Urinary Tract: Areas  of cortical thinning and scarring in the anterior lower pole of the right kidney. No hydronephrosis. No renal or adrenal mass. Urinary bladder unremarkable. Stomach/Bowel: Extensive left colonic diverticulosis. No active diverticulitis. Stomach and small bowel decompressed, unremarkable. Lymphatic: No adenopathy. Reproductive: Mildly prominent prostate. Other: No free fluid or free air. Musculoskeletal: No acute bony abnormality. Review of the MIP images confirms the above findings. IMPRESSION: 4.1 cm ascending thoracic aortic aneurysm, stable since prior study. Recommend annual imaging followup by CTA or MRA. This recommendation follows 2010 ACCF/AHA/AATS/ACR/ASA/SCA/SCAI/SIR/STS/SVM Guidelines for the Diagnosis and Management of Patients with Thoracic Aortic Disease. Circulation. 2010; [id number]. Aortic aneurysm NOS (ICD10-I71.9) Prior stent graft repair of infrarenal abdominal aortic aneurysm. Decreasing sec size, 3.8 cm currently compared to 4.2 cm on previous study. No evidence of aortic dissection. Coronary artery disease, aortic atherosclerosis. No acute cardiopulmonary disease. Left colonic diverticulosis.  No active diverticulitis. Prostate enlargement. Electronically Signed   By: Rolm Baptise M.D.   On: 07/07/2019 17:42    Procedures Procedures (including critical care time)  Medications Ordered in ED Medications  sodium chloride flush (NS) 0.9 % injection 3 mL (3 mLs Intravenous Not Given 07/07/19 1655)  ondansetron (ZOFRAN) injection 4 mg (4 mg Intravenous Given 07/07/19 1708)  morphine 4 MG/ML injection 4 mg (4 mg Intravenous Given 07/07/19 1708)  iohexol (OMNIPAQUE) 350 MG/ML injection 100 mL (100 mLs Intravenous Contrast Given 07/07/19 1649)  sodium chloride 0.9 % bolus 500 mL (0 mLs Intravenous Stopped 07/07/19 1831)  aspirin chewable tablet 324 mg (324 mg Oral Given 07/07/19 1826)    ED Course   I have reviewed the triage vital signs and the nursing notes.  Pertinent labs & imaging results that were available during my care of the patient were reviewed by me and considered in my medical decision making (see chart for details).    MDM Rules/Calculators/A&P                      Patient presenting for evaluation of sudden onset substernal/left-sided chest pains with one episode of nonbloody nonbilious emesis.  Reports mild shortness of breath and persistent nausea as well though the symptoms are improving.  He is afebrile, initially mildly hypertensive, vital signs otherwise stable.  On examination he appears to have mildly decreased radial pulse on the left.  Blood pressures were checked in the bilateral upper extremities with left upper extremity measuring the 112/65 and right upper extremity measuring 118/79.   His EKG shows no acute ischemic abnormalities, occasional PVCs.  Serial troponins are negative and at this time I have a low suspicion of ACS/MI.  Due to patient's history of dissection study was obtained which shows no evidence of dissection, cardiac tamponade, or PE.  No evidence of pneumonia, pneumothorax, or volume overload.  His known thoracic aortic aneurysm is stable in size.  Lab work reviewed and interpreted by myself shows nonspecific leukocytosis, no anemia, no metabolic derangements.  Mildly elevated creatinine but BUN is within normal limits.  On reevaluation patient is resting comfortably in no apparent distress.  Reports that his pain is significantly improved.  He is tolerating sips of fluid in the ED.  We discussed that thus far his work-up has been reassuring and he feels comfortable with discharge home with close follow-up with his PCP and cardiologist.  Differential considerations at this time include costochondritis, pleurisy.  Considered pericarditis though EKG does not suggest this.  Recommended NSAIDs and Tylenol as needed.  He understands to follow-up with  his cardiologist closely.  Discussed strict ED return precautions. Patient verbalized understanding of and agreement with plan and is safe for discharge home at this time.  Patient seen and evaluated by Dr. Roderic Palau who agrees with assessment and plan at this time.  Final Clinical Impression(s) / ED Diagnoses Final diagnoses:  Atypical chest pain    Rx / DC Orders ED Discharge Orders    None       Debroah Baller 07/07/19 1906    Milton Ferguson, MD 07/08/19 [phone]

## 2019-07-07 NOTE — ED Triage Notes (Signed)
Chest pain for the past 3 hours, states he has a history of aneurysm, states he started vomiting PTA

## 2019-08-03 ENCOUNTER — Ambulatory Visit (INDEPENDENT_AMBULATORY_CARE_PROVIDER_SITE_OTHER): Payer: Medicare Other | Admitting: Internal Medicine

## 2019-08-03 ENCOUNTER — Other Ambulatory Visit: Payer: Self-pay

## 2019-08-03 ENCOUNTER — Encounter: Payer: Self-pay | Admitting: Internal Medicine

## 2019-08-03 VITALS — BP 112/64 | HR 62 | Ht 70.0 in | Wt 223.0 lb

## 2019-08-03 DIAGNOSIS — R0789 Other chest pain: Secondary | ICD-10-CM | POA: Diagnosis not present

## 2019-08-03 DIAGNOSIS — Z9889 Other specified postprocedural states: Secondary | ICD-10-CM

## 2019-08-03 DIAGNOSIS — Z8679 Personal history of other diseases of the circulatory system: Secondary | ICD-10-CM | POA: Diagnosis not present

## 2019-08-03 DIAGNOSIS — I712 Thoracic aortic aneurysm, without rupture, unspecified: Secondary | ICD-10-CM

## 2019-08-03 NOTE — Patient Instructions (Signed)
Medication Instructions:  Your physician recommends that you continue on your current medications as directed. Please refer to the Current Medication list given to you today.  *If you need a refill on your cardiac medications before your next appointment, please call your pharmacy*   Testing/Procedures: CT test in May 2022   Follow-Up: At [facility], you and your health needs are our priority.  As part of our continuing mission to provide you with exceptional heart care, we have created designated Provider Care Teams.  These Care Teams include your primary Cardiologist (physician) and Advanced Practice Providers (APPs -  Physician Assistants and Nurse Practitioners) who all work together to provide you with the care you need, when you need it.  We recommend signing up for the patient portal called "MyChart".  Sign up information is provided on this After Visit Summary.  MyChart is used to connect with patients for Virtual Visits (Telemedicine).  Patients are able to view lab/test results, encounter notes, upcoming appointments, etc.  Non-urgent messages can be sent to your provider as well.   To learn more about what you can do with MyChart, go to [email or web address].    Your next appointment:   12 month(s)  The format for your next appointment:   In Person  Provider:   You may see Dr. Debara Pickett or one of the following Advanced Practice Providers on your designated Care Team:    Almyra Deforest, PA-C  Fabian Sharp, [place] or   Roby Lofts, [place]    Other Instructions  Please schedule vascular appointment with Dr. Trula Slade Address: [address], [place], [place] [address] Phone: [phone]

## 2019-08-03 NOTE — Progress Notes (Signed)
OFFICE CONSULT NOTE  Chief Complaint:  Follow-up ER visit for chest pain  Primary Care Physician: Caren Macadam, MD  HPI:  Hector Morris is a 74 y.o. male who is being seen today for the evaluation of aneurysm at the request of [name], [name], MD. This is a pleasant 74 year old male who lives in [place].  He had a recent fall and he was evaluated by CT scan.  The CT scan performed on 02/16/2017, demonstrated a broken left rib which was managed medically, but incidentally demonstrated a 4.3 cm ascending thoracic aortic aneurysm, atherosclerosis of the thoracic aorta and a 5 cm infrarenal abdominal aortic aneurysm.  Annual follow-up of the ascending aortic aneurysm was recommended and referral to vascular surgery based on ACR consensus guidelines.  In addition, Hector Morris had a CT scan of the left shoulder in 2013 after a humeral fracture.  He was run over by a bull at that time.  CAT scan demonstrated coronary artery atherosclerosis, however he was not aware that he had that.  He does have a history of dyslipidemia and has been on simvastatin 40 mg nightly.  He reports remote cardiac catheterization in 1993 which showed no significant coronary disease.  He says he has had long-standing reflux symptoms although recently has been having some chest discomfort.  Most of the time and seems to improve with Tums or reflux medication and he has a known hiatal hernia.  03/23/2017  Hector Morris was seen today in follow-up.  He underwent a nuclear stress test which was negative for ischemia.  He had normal LV function.  He had a lipid profile which showed a total cholesterol 172, triglycerides 181, HDL 36, LDL 100.  He is on simvastatin however based on his higher dose cannot have an up titration based on FDA limitations.  Goal LDL is less than 70.  10/20/2017  Hector Morris returns today for follow-up.  Over the past month he is done well.  He successfully underwent AAA repair using a  stent graft by Dr. Trula Slade.  This was uncomplicated without endoleak.  He said it was almost uneventful.  In addition we have improved his lipid readings.  His most recent cholesterol profile showed total cholesterol 129, triglycerides 148, HDL 32 and LDL 67, at goal LDL.  He also brought a list of blood pressures, as we started him on low-dose lisinopril.  In general his blood pressure ranges between 409-811 systolic fairly routinely.  04/19/2018  Hector Morris seen today for routine follow-up.  Overall he is doing well.  His blood pressure is now very well controlled.  His cholesterol is at goal with LDL less than 70.  He denies any chest pain or worsening shortness of breath.  He will need a repeat CT angiogram of thoracic aorta to look at a sending aortic aneurysm.  He has a follow-up ultrasound for endovascular repair of his abdominal aneurysm per vascular surgery in April and has follow-up afterwards.  05/29/2019  Hector Morris returns today for follow-up.  Overall he feels well.  He denies any chest pain or shortness of breath.  He did have a CT angio of the aorta in January 2021 which showed a stable 4.1 cm ascending aortic aneurysm.  He has not followed up with Dr. Trula Slade since 2019 but was seen in the ER apparently last year and had a CT scan of the abdomen which showed a stable AAA repair.  In fact, the aneurysm sac size had decreased from  prior study measuring 4.2 cm.  Blood pressures well controlled today 128/76.  His recent LDL was 76.  EKG shows sinus rhythm.  08/03/2019  Hector Morris is seen today for follow-up of recent ER visit for chest pain.  He said he had developed acute onset chest pain which was pleuritic and left-sided, sharp and very pinpoint that he could actually point to between the ribs.  Work-up was negative for acute PE and no evidence of enlarging thoracic aortic aneurysm by CT.  Troponins were negative.  He was ultimately felt to have pleurisy.  He had vomited the pain was so bad.   Ultimately was recommended to go on to anti-inflammatory medication and since then it is largely resolved.  PMHx:  Past Medical History:  Diagnosis Date  . Allergy   . Anxiety   . Arthritis   . Dyspnea    with exertion  . GERD (gastroesophageal reflux disease)   . History of hiatal hernia   . History of kidney stones   . Hx of colonic polyps 03/31/2011  . Hyperlipidemia   . Neuromuscular disorder ([place])    hiatal hernia  . Sleep apnea    cpap    Past Surgical History:  Procedure Laterality Date  . ABDOMINAL AORTIC ENDOVASCULAR STENT GRAFT N/A 04/21/2017   Procedure: ABDOMINAL AORTIC ENDOVASCULAR STENT GRAFT;  Surgeon: Serafina Mitchell, MD;  Location: [facility];  Service: Vascular;  Laterality: N/A;  . APPENDECTOMY    . COLONOSCOPY     last 05/2016  . ELBOW SURGERY Left    as teenager  . HERNIA REPAIR Right    inguinal  . KNEE SURGERY Right    scope - cartilage removed  . POLYPECTOMY  2018  . TONSILLECTOMY      FAMHx:  Family History  Problem Relation Age of Onset  . Heart murmur Mother   . Heart attack Father   . Stroke Sister   . Obesity Brother   . Colon cancer Neg Hx   . Colon polyps Neg Hx     SOCHx:   reports that he quit smoking about 13 years ago. His smoking use included cigarettes. He has a 58.50 pack-year smoking history. He has quit using smokeless tobacco.  His smokeless tobacco use included chew. He reports current alcohol use. He reports that he does not use drugs.  ALLERGIES:  Allergies  Allergen Reactions  . Bee Venom Anaphylaxis and Swelling  . Penicillins Anaphylaxis and Other (See Comments)    "Mushroom" popped up on hip Has patient had a PCN reaction causing immediate rash, facial/tongue/throat swelling, SOB or lightheadedness with hypotension: Yes Has patient had a PCN reaction causing severe rash involving mucus membranes or skin necrosis: Yes Has patient had a PCN reaction that required hospitalization: Yes - In hospital Has patient had a PCN  reaction occurring within the last 10 years: No If all of the above answers are "NO", then may proceed with Cephalosporin use.   . Shellfish Allergy Hives and Swelling    CRAB MEAT > WHELPS  . Latex     UNSPECIFIED REACTION     ROS: Pertinent items noted in HPI and remainder of comprehensive ROS otherwise negative.  HOME MEDS: Current Outpatient Medications on File Prior to Visit  Medication Sig Dispense Refill  . aspirin 81 MG chewable tablet Chew 81 mg by mouth daily.    . capsaicin (ZOSTRIX) 0.025 % cream Apply 1 application topically 2 (two) times daily as needed (for feet).    Marland Kitchen  carvedilol (COREG) 6.25 MG tablet Take 6.25 mg by mouth 2 (two) times daily with a meal.    . Cholecalciferol (VITAMIN D3) 125 MCG (5000 UT) CAPS Take 5,000 Units by mouth every morning.    Marland Kitchen lisinopril (PRINIVIL,ZESTRIL) 20 MG tablet Take 0.5 tablets (10 mg total) by mouth daily. 45 tablet 3  . MAGNESIUM OXIDE 400 PO Take 400 mg by mouth daily.    Marland Kitchen omeprazole (PRILOSEC) 20 MG capsule Take 20 mg by mouth daily.    Marland Kitchen PARoxetine (PAXIL) 40 MG tablet Take 20 mg by mouth daily.    . Potassium 99 MG TABS Take 99 mg by mouth daily.    . rosuvastatin (CRESTOR) 40 MG tablet Take 1 tablet (40 mg total) by mouth daily. 90 tablet 3   No current facility-administered medications on file prior to visit.    LABS/IMAGING: No results found for this or any previous visit (from the past 48 hour(s)). No results found.  LIPID PANEL:    Component Value Date/Time   CHOL 127 11/25/2018 1638   CHOL 129 06/28/2017 0823   TRIG 129 11/25/2018 1638   HDL 30 (L) 11/25/2018 1638   HDL 32 (L) 06/28/2017 0823   CHOLHDL 4.2 11/25/2018 1638   LDLCALC 76 11/25/2018 1638    WEIGHTS: Wt Readings from Last 3 Encounters:  08/03/19 223 lb (101.2 kg)  07/07/19 220 lb (99.8 kg)  05/29/19 223 lb (101.2 kg)    VITALS: BP 112/64   Pulse 62   Ht 5\' 10"  (1.778 m)   Wt 223 lb (101.2 kg)   SpO2 94%   BMI 32.00 kg/m    EXAM: General appearance: alert and no distress Neck: no carotid bruit, no JVD and thyroid not enlarged, symmetric, no tenderness/mass/nodules Lungs: clear to auscultation bilaterally Heart: regular rate and rhythm, S1, S2 normal, no murmur, click, rub or gallop Abdomen: soft, non-tender; bowel sounds normal; no masses,  no organomegaly Extremities: extremities normal, atraumatic, no cyanosis or edema Pulses: 2+ and symmetric Skin: Skin color, texture, turgor normal. No rashes or lesions Neurologic: Grossly normal Psych: Pleasant  EKG: Deferred  ASSESSMENT: 1. Atypical chest pain - possibly pleurisy vs chest wall pain 2. Coronary artery disease with calcification and aortic atherosclerosis 3. 4.1-4.3 cm ascending thoracic aortic aneurysm (02/2019) 4. 5.0 cm infrarenal abdominal aortic aneurysm (01/2017) - s/p AAA repair in 03/2017, stable by CT abdomen in 06/2018 5. Dyslipidemia 6. Essential hypertension 7. Family history of heart disease 8. OSA on CPAP  PLAN: 1.   Hector Morris had a atypical chest pain, possible pleurisy versus the chest wall pain.  Work-up showed stable CT findings without any evidence of worsening thoracic aortic aneurysm, pulmonary embolus or concerning cardiac finding.  Troponins were negative.  His symptoms of ultimately resolved.  I would recommend a repeat CT angio of the aorta as scheduled now we can delay it until May 2022 which would be a year follow-up from the last study.  Follow-up with me afterwards or sooner as necessary.  Pixie Casino, MD, [facility], [place] Director of the [facility]  [facility] Diplomate of the American Board of Clinical Lipidology Attending Cardiologist  Direct Dial: [phone]  Fax: [phone]  Website:  [email or web address] 08/03/2019, 2:18 PM

## 2019-08-21 ENCOUNTER — Other Ambulatory Visit: Payer: Self-pay | Admitting: *Deleted

## 2019-08-21 DIAGNOSIS — Z95828 Presence of other vascular implants and grafts: Secondary | ICD-10-CM

## 2019-09-04 ENCOUNTER — Ambulatory Visit (HOSPITAL_COMMUNITY)
Admission: RE | Admit: 2019-09-04 | Discharge: 2019-09-04 | Disposition: A | Discharge status: Home / Self Care | Payer: Medicare Other | Source: Ambulatory Visit | Attending: Surgery | Admitting: Surgery

## 2019-09-04 ENCOUNTER — Ambulatory Visit (INDEPENDENT_AMBULATORY_CARE_PROVIDER_SITE_OTHER): Payer: Medicare Other | Admitting: Physician Assistant

## 2019-09-04 ENCOUNTER — Other Ambulatory Visit: Payer: Self-pay

## 2019-09-04 VITALS — BP 150/76 | HR 68 | Temp 97.6°F | Resp 20 | Ht 70.0 in | Wt 221.4 lb

## 2019-09-04 DIAGNOSIS — I714 Abdominal aortic aneurysm, without rupture, unspecified: Secondary | ICD-10-CM

## 2019-09-04 DIAGNOSIS — Z95828 Presence of other vascular implants and grafts: Secondary | ICD-10-CM

## 2019-09-04 NOTE — Progress Notes (Signed)
Office Note     CC:  follow up Requesting Provider:  Caren Macadam, MD  HPI: Hector Morris is a 74 y.o. (Nov 20, 1945) male who presents for routine follow up s/p endovascular repair of a infrarenal abdominal aortic aneurysm on 04/21/17 by Dr. Trula Slade. The AAA had a maximum diameter of 5.0 cm.   He was last seen on 11-23-17. At that time duplex showed decrease in the aneurysm size from 4.7 cm down to 4.5 cm. There was no evidence of endoleak. His aneurysm was getting smaller. He was to follow-up in 6 months with an ultrasound. Due to COVID he has not had a follow up since this date.   He presents today for follow up with EVAR duplex: He denies back pain or abdominal pain. Pt denies chest pain, denies dyspnea, denies headache.   He has history of thoracic aortic aneurysm which is followed up Dr. Debara Pickett    The pt is on a statin for cholesterol management.  The pt is on a daily aspirin.   Other AC: none  The pt is on ACE and BB for hypertension.   The pt is not diabetic.  Tobacco hx: former, quit 2011  Past Medical History:  Diagnosis Date  . Allergy   . Anxiety   . Arthritis   . Dyspnea    with exertion  . GERD (gastroesophageal reflux disease)   . History of hiatal hernia   . History of kidney stones   . Hx of colonic polyps 03/31/2011  . Hyperlipidemia   . Neuromuscular disorder ([place])    hiatal hernia  . Sleep apnea    cpap    Past Surgical History:  Procedure Laterality Date  . ABDOMINAL AORTIC ENDOVASCULAR STENT GRAFT N/A 04/21/2017   Procedure: ABDOMINAL AORTIC ENDOVASCULAR STENT GRAFT;  Surgeon: Serafina Mitchell, MD;  Location: [facility];  Service: Vascular;  Laterality: N/A;  . APPENDECTOMY    . COLONOSCOPY     last 05/2016  . ELBOW SURGERY Left    as teenager  . HERNIA REPAIR Right    inguinal  . KNEE SURGERY Right    scope - cartilage removed  . POLYPECTOMY  2018  . TONSILLECTOMY      Social History   Socioeconomic History  . Marital status:  Married    Spouse name: Not on file  . Number of children: Not on file  . Years of education: Not on file  . Highest education level: Not on file  Occupational History  . Not on file  Tobacco Use  . Smoking status: Former Smoker    Packs/day: 1.50    Years: 39.00    Pack years: 58.50    Types: Cigarettes    Quit date: 02/23/2006    Years since quitting: 13.5  . Smokeless tobacco: Former Systems developer    Types: Secondary school teacher  . Vaping Use: Never used  Substance and Sexual Activity  . Alcohol use: Yes    Comment: occasional  . Drug use: No  . Sexual activity: Not on file  Other Topics Concern  . Not on file  Social History Narrative  . Not on file   Social Determinants of Health   Financial Resource Strain:   . Difficulty of Paying Living Expenses:   Food Insecurity:   . Worried About Charity fundraiser in the Last Year:   . Arboriculturist in the Last Year:   Transportation Needs:   . Lack of Transportation (  Medical):   Marland Kitchen Lack of Transportation (Non-Medical):   Physical Activity:   . Days of Exercise per Week:   . Minutes of Exercise per Session:   Stress:   . Feeling of Stress :   Social Connections:   . Frequency of Communication with Friends and Family:   . Frequency of Social Gatherings with Friends and Family:   . Attends Religious Services:   . Active Member of Clubs or Organizations:   . Attends Archivist Meetings:   Marland Kitchen Marital Status:   Intimate Partner Violence:   . Fear of Current or Ex-Partner:   . Emotionally Abused:   Marland Kitchen Physically Abused:   . Sexually Abused:     Family History  Problem Relation Age of Onset  . Heart murmur Mother   . Heart attack Father   . Stroke Sister   . Obesity Brother   . Colon cancer Neg Hx   . Colon polyps Neg Hx     Current Outpatient Medications  Medication Sig Dispense Refill  . aspirin 81 MG chewable tablet Chew 81 mg by mouth daily.    . capsaicin (ZOSTRIX) 0.025 % cream Apply 1 application topically  2 (two) times daily as needed (for feet).    . carvedilol (COREG) 6.25 MG tablet Take 6.25 mg by mouth 2 (two) times daily with a meal.    . Cholecalciferol (VITAMIN D3) 125 MCG (5000 UT) CAPS Take 5,000 Units by mouth every morning.    Marland Kitchen lisinopril (PRINIVIL,ZESTRIL) 20 MG tablet Take 0.5 tablets (10 mg total) by mouth daily. 45 tablet 3  . omeprazole (PRILOSEC) 20 MG capsule Take 20 mg by mouth daily.    Marland Kitchen PARoxetine (PAXIL) 40 MG tablet Take 20 mg by mouth daily.    . rosuvastatin (CRESTOR) 40 MG tablet Take 1 tablet (40 mg total) by mouth daily. 90 tablet 3   No current facility-administered medications for this visit.    Allergies  Allergen Reactions  . Bee Venom Anaphylaxis and Swelling  . Penicillins Anaphylaxis and Other (See Comments)    "Mushroom" popped up on hip Has patient had a PCN reaction causing immediate rash, facial/tongue/throat swelling, SOB or lightheadedness with hypotension: Yes Has patient had a PCN reaction causing severe rash involving mucus membranes or skin necrosis: Yes Has patient had a PCN reaction that required hospitalization: Yes - In hospital Has patient had a PCN reaction occurring within the last 10 years: No If all of the above answers are "NO", then may proceed with Cephalosporin use.   . Shellfish Allergy Hives and Swelling    CRAB MEAT > WHELPS  . Latex     UNSPECIFIED REACTION      REVIEW OF SYSTEMS:  [X]  denotes positive finding, [ ]  denotes negative finding Cardiac  Comments:  Chest pain or chest pressure:    Shortness of breath upon exertion:    Short of breath when lying flat:    Irregular heart rhythm:        Vascular    Pain in calf, thigh, or hip brought on by ambulation:    Pain in feet at night that wakes you up from your sleep:     Blood clot in your veins:    Leg swelling:         Pulmonary    Oxygen at home:    Productive cough:     Wheezing:         Neurologic    Sudden weakness in arms  or legs:     Sudden  numbness in arms or legs:     Sudden onset of difficulty speaking or slurred speech:    Temporary loss of vision in one eye:     Problems with dizziness:         Gastrointestinal    Blood in stool:     Vomited blood:         Genitourinary    Burning when urinating:     Blood in urine:        Psychiatric    Major depression:         Hematologic    Bleeding problems:    Problems with blood clotting too easily:        Skin    Rashes or ulcers:        Constitutional    Fever or chills:      PHYSICAL EXAMINATION:  Vitals:   09/04/19 0855  BP: (!) 150/76  Pulse: 68  Resp: 20  Temp: 97.6 F (36.4 C)  TempSrc: Temporal  SpO2: 97%  Weight: 221 lb 6.4 oz (100.4 kg)  Height: 5\' 10"  (1.778 m)    General:  WDWN in NAD; Difficulty hearing. vital signs documented above Gait: Normal HENT: WNL, normocephalic Pulmonary: normal non-labored breathing , without wheezing Cardiac: regular HR, without  Murmurs without carotid bruit Abdomen: soft, NT, no masses Skin: without rashes Vascular Exam/Pulses:  Right Left  Radial 2+ (normal) 2+ (normal)  Ulnar 2+ (normal) 2+ (normal)  Femoral 2+ (normal) 2+ (normal)  Popliteal 2+ (normal) 2+ (normal)  DP 2+ (normal) 2+ (normal)  PT 2+ (normal) 2+ (normal)   Extremities: without ischemic changes, without Gangrene , without cellulitis; without open wounds;  Musculoskeletal: no muscle wasting or atrophy  Neurologic: A&O X 3;  No focal weakness or paresthesias are detected Psychiatric:  The pt has Normal affect.   Non-Invasive Vascular Imaging:   EVAR Duplex  Previous EVAR Duplex (Date: 11-23-17)  AAA sac size: 4.5 cm; Right CIA: 1.5 cm; Left CIA: 1.6 cm  no endoleak detected  Current EVAR Duplex (09-03-17) - AAA sac size: 4.2 cm; Right CIA 1.6 cm; Left CIA: 1.6 cm - no endoleak   ASSESSMENT/PLAN:: 74 y.o. male here for follow up s/p EVAR (Date: 04-21-17).  Pt is asymptomatic with a decrease in sac size from 4.5 cm to 4.5 cm  today by duplex.Dr. Debara Pickett is monitoring his thoracic aortic aneurysm  I discussed with the patient the importance of surveillance of the endograft.  He will follow up earlier if he has any new or concerning symptoms and discussed going to ER if he develops sudden onset of severe abdominal or back pain   The next endograft duplex will be scheduled for 1 year   Karoline Caldwell, PA-C [facility] [phone]  Clinic MD:   Trula Slade

## 2019-10-20 DIAGNOSIS — Z131 Encounter for screening for diabetes mellitus: Secondary | ICD-10-CM | POA: Diagnosis not present

## 2019-10-20 DIAGNOSIS — R351 Nocturia: Secondary | ICD-10-CM | POA: Diagnosis not present

## 2019-10-20 DIAGNOSIS — R42 Dizziness and giddiness: Secondary | ICD-10-CM | POA: Diagnosis not present

## 2019-11-01 ENCOUNTER — Ambulatory Visit (INDEPENDENT_AMBULATORY_CARE_PROVIDER_SITE_OTHER): Payer: Medicare Other | Admitting: Family Medicine

## 2019-11-01 ENCOUNTER — Other Ambulatory Visit: Payer: Self-pay

## 2019-11-01 ENCOUNTER — Encounter: Payer: Self-pay | Admitting: Family Medicine

## 2019-11-01 ENCOUNTER — Telehealth: Payer: Self-pay | Admitting: Family Medicine

## 2019-11-01 VITALS — BP 120/64 | HR 75 | Temp 98.1°F | Ht 70.0 in | Wt 223.8 lb

## 2019-11-01 DIAGNOSIS — Z1159 Encounter for screening for other viral diseases: Secondary | ICD-10-CM | POA: Diagnosis not present

## 2019-11-01 DIAGNOSIS — I2510 Atherosclerotic heart disease of native coronary artery without angina pectoris: Secondary | ICD-10-CM

## 2019-11-01 DIAGNOSIS — I712 Thoracic aortic aneurysm, without rupture, unspecified: Secondary | ICD-10-CM

## 2019-11-01 DIAGNOSIS — E538 Deficiency of other specified B group vitamins: Secondary | ICD-10-CM | POA: Diagnosis not present

## 2019-11-01 DIAGNOSIS — E039 Hypothyroidism, unspecified: Secondary | ICD-10-CM | POA: Diagnosis not present

## 2019-11-01 DIAGNOSIS — G4730 Sleep apnea, unspecified: Secondary | ICD-10-CM

## 2019-11-01 DIAGNOSIS — H8110 Benign paroxysmal vertigo, unspecified ear: Secondary | ICD-10-CM

## 2019-11-01 DIAGNOSIS — R5383 Other fatigue: Secondary | ICD-10-CM

## 2019-11-01 DIAGNOSIS — E559 Vitamin D deficiency, unspecified: Secondary | ICD-10-CM | POA: Diagnosis not present

## 2019-11-01 DIAGNOSIS — E785 Hyperlipidemia, unspecified: Secondary | ICD-10-CM

## 2019-11-01 DIAGNOSIS — I2584 Coronary atherosclerosis due to calcified coronary lesion: Secondary | ICD-10-CM

## 2019-11-01 DIAGNOSIS — Z86010 Personal history of colonic polyps: Secondary | ICD-10-CM

## 2019-11-01 NOTE — Progress Notes (Signed)
°  Chronic Care Management   Outreach Note  11/01/2019 Name: Hector Morris MRN: [id number] DOB: [birth year]  Referred by: Caren Macadam, MD Reason for referral : No chief complaint on file.   An unsuccessful telephone outreach was attempted today. The patient was referred to the pharmacist for assistance with care management and care coordination.   Follow Up Plan:   Carley Perdue [employer] Scheduler

## 2019-11-01 NOTE — Progress Notes (Addendum)
Hector Morris DOB: 26-Nov-1945 Encounter date: 11/01/2019  This is a 74 y.o. male who presents with Chief Complaint  Patient presents with  . Thyroid Problem    patient's wife states she took the pt to [facility] in [place] (urgent care) due to low BP, stated he had abnormal thyroid test results and referred back to PCP (see copy of labs)    History of present illness: Follows with vascular and vein for thoracic aortic aneurysm.  Has been stable.  Hearing has gotten extremely worse; seeing Dr. Lucia Gaskins on Friday.   Has been having vertigo - had labwork done at recent urgent care visit. Vertigo has been there for 4 weeks. Not as bad as when it hit him. Initially was throwing up; hard to get out of bed. Doing head exercises every morning. During day at different times gets dizzy or notes issues with bending down, doing something. Spinning all over when it occurs.   Has cataract on right eye - foggy.   Was advised to stop taking lisinopril (at urgent care) due to dry cough and hypotension.   Hyperlipidemia:takes statin daily - crestor.  Sleep apnea: not wearing machine due to dizziness.   He was not pain.  Evaluation was negative at that time and pain resolved while in the emergency room.  Last visit with me was in October/2020. They didn't want to do virtual visit. He had physical after that visit at the [place].   Had eyes checked in [facility].   Had COVID virus - in February.   Depression: paxil 20mg ; doing well with this.   Arthritis: right knee still bothers him. Not taking any medication for this. Has done well with injections in the past.   Due for repeat colonoscopy in April/2023  Allergies  Allergen Reactions  . Bee Venom Anaphylaxis and Swelling  . Penicillins Anaphylaxis and Other (See Comments)    "Mushroom" popped up on hip Has patient had a PCN reaction causing immediate rash, facial/tongue/throat swelling, SOB or lightheadedness with hypotension:  Yes Has patient had a PCN reaction causing severe rash involving mucus membranes or skin necrosis: Yes Has patient had a PCN reaction that required hospitalization: Yes - In hospital Has patient had a PCN reaction occurring within the last 10 years: No If all of the above answers are "NO", then may proceed with Cephalosporin use.   . Shellfish Allergy Hives and Swelling    CRAB MEAT > WHELPS  . Latex     UNSPECIFIED REACTION    Current Meds  Medication Sig  . aspirin 81 MG chewable tablet Chew 81 mg by mouth daily.  . capsaicin (ZOSTRIX) 0.025 % cream Apply 1 application topically 2 (two) times daily as needed (for feet).  . carvedilol (COREG) 6.25 MG tablet Take 6.25 mg by mouth 2 (two) times daily with a meal.  . Cholecalciferol (VITAMIN D3) 125 MCG (5000 UT) CAPS Take 5,000 Units by mouth every morning.  Marland Kitchen omeprazole (PRILOSEC) 20 MG capsule Take 20 mg by mouth daily.  Marland Kitchen PARoxetine (PAXIL) 40 MG tablet Take 20 mg by mouth daily.  . rosuvastatin (CRESTOR) 40 MG tablet Take 1 tablet (40 mg total) by mouth daily.    Review of Systems  Constitutional: Negative for chills, fatigue and fever.  Respiratory: Negative for cough, chest tightness, shortness of breath and wheezing.   Cardiovascular: Negative for chest pain, palpitations and leg swelling.    Objective:  BP 120/64 (BP Location: Left Arm, Patient Position: Sitting,  Cuff Size: Large)   Pulse 75   Temp 98.1 F (36.7 C) (Oral)   Ht 5\' 10"  (1.778 m)   Wt 223 lb 12.8 oz (101.5 kg)   BMI 32.11 kg/m   Weight: 223 lb 12.8 oz (101.5 kg)   BP Readings from Last 3 Encounters:  11/01/19 120/64  09/04/19 (!) 150/76  08/03/19 112/64   Wt Readings from Last 3 Encounters:  11/01/19 223 lb 12.8 oz (101.5 kg)  09/04/19 221 lb 6.4 oz (100.4 kg)  08/03/19 223 lb (101.2 kg)    Physical Exam Constitutional:      General: He is not in acute distress.    Appearance: He is well-developed.  HENT:     Right Ear: Ear canal and  external ear normal.     Left Ear: Ear canal and external ear normal.     Ears:     Comments: Pinpoint TM hole  Mild opacity left TM Cardiovascular:     Rate and Rhythm: Normal rate and regular rhythm.     Heart sounds: Normal heart sounds. No murmur heard.  No friction rub.  Pulmonary:     Effort: Pulmonary effort is normal. No respiratory distress.     Breath sounds: Normal breath sounds. No wheezing or rales.  Musculoskeletal:     Right lower leg: No edema.     Left lower leg: No edema.  Neurological:     Mental Status: He is alert and oriented to person, place, and time.  Psychiatric:        Behavior: Behavior normal.     Assessment/Plan  1. Thoracic aortic aneurysm without rupture [facility]) Following with cardiology.   2. Coronary artery calcification Continues with statin; recheck lipids today.   3. Sleep apnea, unspecified type Restart using machine.  - CBC with Differential/Platelet; Future  4. Hx of colonic polyps Due for repeat colonoscopy 05/2021.   5. Hyperlipidemia LDL goal <70 crestor 40mg  - Comprehensive metabolic panel; Future - Lipid panel; Future  6. Hypothyroidism, unspecified type This was mild and stable and recent labs. We are going to recheck for stability. I don't suspect this is contributing to dizziness.  - TSH; Future - T4, free; Future - T3, free; Future  7. Encounter for hepatitis C screening test for low risk patient - Hepatitis C antibody; Future  8. Fatigue, unspecified type Recheck labs, vitamin levels.   9. B12 deficiency - Vitamin B12; Future  10. Vitamin D deficiency - VITAMIN D 25 Hydroxy (Vit-D Deficiency, Fractures); Future  11. bpv - seeing ENT in 2 days; continue home exercises. Has improved. Consider formal PT.     Return in about 6 months (around 04/30/2020) for Chronic condition visit.     Micheline Rough, MD

## 2019-11-02 LAB — COMPREHENSIVE METABOLIC PANEL
AG Ratio: 1.6 (calc) (ref 1.0–2.5)
ALT: 11 U/L (ref 9–46)
AST: 19 U/L (ref 10–35)
Albumin: 3.9 g/dL (ref 3.6–5.1)
Alkaline phosphatase (APISO): 45 U/L (ref 35–144)
BUN/Creatinine Ratio: 11 (calc) (ref 6–22)
BUN: 14 mg/dL (ref 7–25)
CO2: 27 mmol/L (ref 20–32)
Calcium: 9.2 mg/dL (ref 8.6–10.3)
Chloride: 105 mmol/L (ref 98–110)
Creat: 1.26 mg/dL — ABNORMAL HIGH (ref 0.70–1.18)
Globulin: 2.4 g/dL (calc) (ref 1.9–3.7)
Glucose, Bld: 92 mg/dL (ref 65–99)
Potassium: 4.1 mmol/L (ref 3.5–5.3)
Sodium: 140 mmol/L (ref 135–146)
Total Bilirubin: 0.4 mg/dL (ref 0.2–1.2)
Total Protein: 6.3 g/dL (ref 6.1–8.1)

## 2019-11-02 LAB — CBC WITH DIFFERENTIAL/PLATELET
Absolute Monocytes: 728 cells/uL (ref 200–950)
Basophils Absolute: 88 cells/uL (ref 0–200)
Basophils Relative: 1.1 %
Eosinophils Absolute: 320 cells/uL (ref 15–500)
Eosinophils Relative: 4 %
HCT: 36.5 % — ABNORMAL LOW (ref 38.5–50.0)
Hemoglobin: 12 g/dL — ABNORMAL LOW (ref 13.2–17.1)
Lymphs Abs: 1312 cells/uL (ref 850–3900)
MCH: 30.6 pg (ref 27.0–33.0)
MCHC: 32.9 g/dL (ref 32.0–36.0)
MCV: 93.1 fL (ref 80.0–100.0)
MPV: 12.1 fL (ref 7.5–12.5)
Monocytes Relative: 9.1 %
Neutro Abs: 5552 cells/uL (ref 1500–7800)
Neutrophils Relative %: 69.4 %
Platelets: 282 10*3/uL (ref 140–400)
RBC: 3.92 10*6/uL — ABNORMAL LOW (ref 4.20–5.80)
RDW: 12.5 % (ref 11.0–15.0)
Total Lymphocyte: 16.4 %
WBC: 8 10*3/uL (ref 3.8–10.8)

## 2019-11-02 LAB — TSH: TSH: 5.98 mIU/L — ABNORMAL HIGH (ref 0.40–4.50)

## 2019-11-02 LAB — LIPID PANEL
Cholesterol: 136 mg/dL (ref <200)
HDL: 29 mg/dL — ABNORMAL LOW (ref >=40)
LDL Cholesterol (Calc): 77 mg/dL (calc)
Non-HDL Cholesterol (Calc): 107 mg/dL (calc) (ref <130)
Total CHOL/HDL Ratio: 4.7 (calc) (ref <5.0)
Triglycerides: 206 mg/dL — ABNORMAL HIGH (ref <150)

## 2019-11-02 LAB — VITAMIN D 25 HYDROXY (VIT D DEFICIENCY, FRACTURES): Vit D, 25-Hydroxy: 69 ng/mL (ref 30–100)

## 2019-11-02 LAB — HEPATITIS C ANTIBODY
Hepatitis C Ab: NONREACTIVE
SIGNAL TO CUT-OFF: 0.01 (ref <1.00)

## 2019-11-02 LAB — T4, FREE: Free T4: 0.6 ng/dL — ABNORMAL LOW (ref 0.8–1.8)

## 2019-11-02 LAB — VITAMIN B12: Vitamin B-12: 291 pg/mL (ref 200–1100)

## 2019-11-02 LAB — T3, FREE: T3, Free: 3.1 pg/mL (ref 2.3–4.2)

## 2019-11-03 ENCOUNTER — Encounter (INDEPENDENT_AMBULATORY_CARE_PROVIDER_SITE_OTHER): Payer: Self-pay | Admitting: Otolaryngology

## 2019-11-03 ENCOUNTER — Other Ambulatory Visit: Payer: Self-pay

## 2019-11-03 ENCOUNTER — Ambulatory Visit (INDEPENDENT_AMBULATORY_CARE_PROVIDER_SITE_OTHER): Payer: Medicare Other | Admitting: Otolaryngology

## 2019-11-03 VITALS — Temp 97.7°F

## 2019-11-03 DIAGNOSIS — H8112 Benign paroxysmal vertigo, left ear: Secondary | ICD-10-CM | POA: Diagnosis not present

## 2019-11-03 DIAGNOSIS — H903 Sensorineural hearing loss, bilateral: Secondary | ICD-10-CM | POA: Diagnosis not present

## 2019-11-03 DIAGNOSIS — I2510 Atherosclerotic heart disease of native coronary artery without angina pectoris: Secondary | ICD-10-CM

## 2019-11-03 DIAGNOSIS — I2584 Coronary atherosclerosis due to calcified coronary lesion: Secondary | ICD-10-CM

## 2019-11-03 NOTE — Progress Notes (Signed)
HPI: Hector Morris is a 74 y.o. male who returns today for evaluation of vertigo they has had for 4 weeks now.  This occurs at night when he rolls over in bed especially first gets up in the morning.  He has vertigo as well as nausea and sometimes vomiting.  He has been seen a couple years ago with a diagnosis of BPPV and he has tried the Epley maneuver although he is not doing it correctly...  Also his hearing has been getting a lot worse.  He wears hearing aids.  Past Medical History:  Diagnosis Date  . Allergy   . Anxiety   . Arthritis   . Dyspnea    with exertion  . GERD (gastroesophageal reflux disease)   . History of hiatal hernia   . History of kidney stones   . Hx of colonic polyps 03/31/2011  . Hyperlipidemia   . Neuromuscular disorder ([place])    hiatal hernia  . Sleep apnea    cpap   Past Surgical History:  Procedure Laterality Date  . ABDOMINAL AORTIC ENDOVASCULAR STENT GRAFT N/A 04/21/2017   Procedure: ABDOMINAL AORTIC ENDOVASCULAR STENT GRAFT;  Surgeon: Serafina Mitchell, MD;  Location: [facility];  Service: Vascular;  Laterality: N/A;  . APPENDECTOMY    . COLONOSCOPY     last 05/2016  . ELBOW SURGERY Left    as teenager  . HERNIA REPAIR Right    inguinal  . KNEE SURGERY Right    scope - cartilage removed  . POLYPECTOMY  2018  . TONSILLECTOMY     Social History   Socioeconomic History  . Marital status: Married    Spouse name: Not on file  . Number of children: Not on file  . Years of education: Not on file  . Highest education level: Not on file  Occupational History  . Not on file  Tobacco Use  . Smoking status: Former Smoker    Packs/day: 1.50    Years: 39.00    Pack years: 58.50    Types: Cigarettes    Quit date: 02/23/2006    Years since quitting: 13.7  . Smokeless tobacco: Former Systems developer    Types: Secondary school teacher  . Vaping Use: Never used  Substance and Sexual Activity  . Alcohol use: Yes    Comment: occasional  . Drug use: No  . Sexual activity:  Not on file  Other Topics Concern  . Not on file  Social History Narrative  . Not on file   Social Determinants of Health   Financial Resource Strain:   . Difficulty of Paying Living Expenses: Not on file  Food Insecurity:   . Worried About Charity fundraiser in the Last Year: Not on file  . Ran Out of Food in the Last Year: Not on file  Transportation Needs:   . Lack of Transportation (Medical): Not on file  . Lack of Transportation (Non-Medical): Not on file  Physical Activity:   . Days of Exercise per Week: Not on file  . Minutes of Exercise per Session: Not on file  Stress:   . Feeling of Stress : Not on file  Social Connections:   . Frequency of Communication with Friends and Family: Not on file  . Frequency of Social Gatherings with Friends and Family: Not on file  . Attends Religious Services: Not on file  . Active Member of Clubs or Organizations: Not on file  . Attends Archivist Meetings: Not on  file  . Marital Status: Not on file   Family History  Problem Relation Age of Onset  . Heart murmur Mother   . Heart attack Father   . Stroke Sister   . Obesity Brother   . Colon cancer Neg Hx   . Colon polyps Neg Hx    Allergies  Allergen Reactions  . Bee Venom Anaphylaxis and Swelling  . Penicillins Anaphylaxis and Other (See Comments)    "Mushroom" popped up on hip Has patient had a PCN reaction causing immediate rash, facial/tongue/throat swelling, SOB or lightheadedness with hypotension: Yes Has patient had a PCN reaction causing severe rash involving mucus membranes or skin necrosis: Yes Has patient had a PCN reaction that required hospitalization: Yes - In hospital Has patient had a PCN reaction occurring within the last 10 years: No If all of the above answers are "NO", then may proceed with Cephalosporin use.   . Shellfish Allergy Hives and Swelling    CRAB MEAT > WHELPS  . Latex     UNSPECIFIED REACTION    Prior to Admission medications    Medication Sig Start Date End Date Taking? Authorizing Provider  aspirin 81 MG chewable tablet Chew 81 mg by mouth daily.   Yes [provider]  capsaicin (ZOSTRIX) 0.025 % cream Apply 1 application topically 2 (two) times daily as needed (for feet).   Yes [provider]  carvedilol (COREG) 6.25 MG tablet Take 6.25 mg by mouth 2 (two) times daily with a meal.   Yes [provider]  Cholecalciferol (VITAMIN D3) 125 MCG (5000 UT) CAPS Take 5,000 Units by mouth every morning.   Yes [provider]  omeprazole (PRILOSEC) 20 MG capsule Take 20 mg by mouth daily.   Yes [provider]  PARoxetine (PAXIL) 40 MG tablet Take 20 mg by mouth daily.   Yes [provider]  rosuvastatin (CRESTOR) 40 MG tablet Take 1 tablet (40 mg total) by mouth daily. 03/23/17  Yes Hilty, Nadean Corwin, MD     Positive ROS: Otherwise negative  All other systems have been reviewed and were otherwise negative with the exception of those mentioned in the HPI and as above.  Physical Exam: Constitutional: Alert, well-appearing, no acute distress Ears: External ears without lesions or tenderness. Ear canals are clear bilaterally.  TMs are intact and clear bilaterally.  Anteriorly the left TM has a monomeric membrane which is very thin and clear but there is no perforation as TMs have good mobility on pneumatic otoscopy.  On Dix-Hallpike testing patient has severe nystagmus vertigo and nausea.  I performed the Epley maneuver with the patient in the office today and gave him information on the proper Epley maneuver. Nasal: External nose without lesions. Clear nasal passages Oral: Lips and gums without lesions. Tongue and palate mucosa without lesions. Posterior oropharynx clear. Neck: No palpable adenopathy or masses Respiratory: Breathing comfortably  Skin: No facial/neck lesions or rash noted.  Procedures  Assessment: Bilateral sensorineural hearing loss. Benign paroxysmal  positional vertigo  Plan: I performed the Epley maneuver with the patient and his wife in the office today and gave him information on BPPV as well as the Epley maneuver. We will schedule him for audiologic testing and follow-up following the hearing test.   Radene Journey, MD

## 2019-11-06 ENCOUNTER — Telehealth: Payer: Self-pay | Admitting: Family Medicine

## 2019-11-06 NOTE — Telephone Encounter (Signed)
Result note complete.

## 2019-11-06 NOTE — Telephone Encounter (Signed)
Pt's spouse called to see if pt's results are in. Informed her that he did not list her on his DPR so I am unable to relay any info. Spouse passed the phone to the pt, Hector Morris, informed him that the PCP has not reviewed them yet but will contact him when she does.

## 2019-11-08 ENCOUNTER — Telehealth: Payer: Self-pay | Admitting: Family Medicine

## 2019-11-08 ENCOUNTER — Other Ambulatory Visit: Payer: Self-pay | Admitting: Family Medicine

## 2019-11-08 MED ORDER — LEVOTHYROXINE SODIUM 25 MCG PO TABS: 25.0000 ug | ORAL_TABLET | Freq: Every day | ORAL | 1 refills | Status: AC

## 2019-11-08 NOTE — Telephone Encounter (Signed)
See results note. 

## 2019-11-08 NOTE — Progress Notes (Signed)
  Chronic Care Management   Outreach Note  11/08/2019 Name: Hector Morris MRN: [id number] DOB: 1945-02-25  Referred by: Caren Macadam, MD Reason for referral : No chief complaint on file.   A second unsuccessful telephone outreach was attempted today. The patient was referred to pharmacist for assistance with care management and care coordination.  Follow Up Plan:   Carley Perdue [employer] Scheduler

## 2019-11-13 ENCOUNTER — Ambulatory Visit (INDEPENDENT_AMBULATORY_CARE_PROVIDER_SITE_OTHER): Payer: Medicare Other | Admitting: Otolaryngology

## 2019-11-13 ENCOUNTER — Other Ambulatory Visit: Payer: Self-pay

## 2019-11-13 VITALS — Temp 97.3°F

## 2019-11-13 DIAGNOSIS — H903 Sensorineural hearing loss, bilateral: Secondary | ICD-10-CM

## 2019-11-13 DIAGNOSIS — H8112 Benign paroxysmal vertigo, left ear: Secondary | ICD-10-CM | POA: Diagnosis not present

## 2019-11-13 DIAGNOSIS — I2510 Atherosclerotic heart disease of native coronary artery without angina pectoris: Secondary | ICD-10-CM

## 2019-11-13 DIAGNOSIS — I2584 Coronary atherosclerosis due to calcified coronary lesion: Secondary | ICD-10-CM

## 2019-11-13 NOTE — Progress Notes (Signed)
HPI: Hector Morris is a 74 y.o. male who returns today for evaluation of hearing loss as well as bouts of vertigo.  He has performed the Epley maneuver at home and although he still has some bouts with the vertigo he is doing better overall with his vertigo and dizziness.  His audiologic testing today in the office demonstrated moderate to profound hearing loss in both ears with SRT's of 65 dB bilaterally.  He had type A tympanograms bilaterally..  Past Medical History:  Diagnosis Date  . Allergy   . Anxiety   . Arthritis   . Dyspnea    with exertion  . GERD (gastroesophageal reflux disease)   . History of hiatal hernia   . History of kidney stones   . Hx of colonic polyps 03/31/2011  . Hyperlipidemia   . Neuromuscular disorder ([place])    hiatal hernia  . Sleep apnea    cpap   Past Surgical History:  Procedure Laterality Date  . ABDOMINAL AORTIC ENDOVASCULAR STENT GRAFT N/A 04/21/2017   Procedure: ABDOMINAL AORTIC ENDOVASCULAR STENT GRAFT;  Surgeon: Serafina Mitchell, MD;  Location: [place];  Service: Vascular;  Laterality: N/A;  . APPENDECTOMY    . COLONOSCOPY     last 05/2016  . ELBOW SURGERY Left    as teenager  . HERNIA REPAIR Right    inguinal  . KNEE SURGERY Right    scope - cartilage removed  . POLYPECTOMY  2018  . TONSILLECTOMY     Social History   Socioeconomic History  . Marital status: Married    Spouse name: Not on file  . Number of children: Not on file  . Years of education: Not on file  . Highest education level: Not on file  Occupational History  . Not on file  Tobacco Use  . Smoking status: Former Smoker    Packs/day: 1.50    Years: 39.00    Pack years: 58.50    Types: Cigarettes    Quit date: 02/23/2006    Years since quitting: 13.7  . Smokeless tobacco: Former Systems developer    Types: Secondary school teacher  . Vaping Use: Never used  Substance and Sexual Activity  . Alcohol use: Yes    Comment: occasional  . Drug use: No  . Sexual activity: Not on file   Other Topics Concern  . Not on file  Social History Narrative  . Not on file   Social Determinants of Health   Financial Resource Strain:   . Difficulty of Paying Living Expenses: Not on file  Food Insecurity:   . Worried About Charity fundraiser in the Last Year: Not on file  . Ran Out of Food in the Last Year: Not on file  Transportation Needs:   . Lack of Transportation (Medical): Not on file  . Lack of Transportation (Non-Medical): Not on file  Physical Activity:   . Days of Exercise per Week: Not on file  . Minutes of Exercise per Session: Not on file  Stress:   . Feeling of Stress : Not on file  Social Connections:   . Frequency of Communication with Friends and Family: Not on file  . Frequency of Social Gatherings with Friends and Family: Not on file  . Attends Religious Services: Not on file  . Active Member of Clubs or Organizations: Not on file  . Attends Archivist Meetings: Not on file  . Marital Status: Not on file   Family History  Problem Relation Age of Onset  . Heart murmur Mother   . Heart attack Father   . Stroke Sister   . Obesity Brother   . Colon cancer Neg Hx   . Colon polyps Neg Hx    Allergies  Allergen Reactions  . Bee Venom Anaphylaxis and Swelling  . Penicillins Anaphylaxis and Other (See Comments)    "Mushroom" popped up on hip Has patient had a PCN reaction causing immediate rash, facial/tongue/throat swelling, SOB or lightheadedness with hypotension: Yes Has patient had a PCN reaction causing severe rash involving mucus membranes or skin necrosis: Yes Has patient had a PCN reaction that required hospitalization: Yes - In hospital Has patient had a PCN reaction occurring within the last 10 years: No If all of the above answers are "NO", then may proceed with Cephalosporin use.   . Shellfish Allergy Hives and Swelling    CRAB MEAT > WHELPS  . Latex     UNSPECIFIED REACTION    Prior to Admission medications   Medication  Sig Start Date End Date Taking? Authorizing Provider  aspirin 81 MG chewable tablet Chew 81 mg by mouth daily.   Yes [provider]  capsaicin (ZOSTRIX) 0.025 % cream Apply 1 application topically 2 (two) times daily as needed (for feet).   Yes [provider]  carvedilol (COREG) 6.25 MG tablet Take 6.25 mg by mouth 2 (two) times daily with a meal.   Yes [provider]  Cholecalciferol (VITAMIN D3) 125 MCG (5000 UT) CAPS Take 5,000 Units by mouth every morning.   Yes [provider]  levothyroxine (SYNTHROID) 25 MCG tablet Take 1 tablet (25 mcg total) by mouth daily before breakfast. 11/08/19  Yes [name], Junell C, MD  omeprazole (PRILOSEC) 20 MG capsule Take 20 mg by mouth daily.   Yes [provider]  PARoxetine (PAXIL) 40 MG tablet Take 20 mg by mouth daily.   Yes [provider]  rosuvastatin (CRESTOR) 40 MG tablet Take 1 tablet (40 mg total) by mouth daily. 03/23/17  Yes Hilty, Nadean Corwin, MD     Positive ROS: Otherwise negative  All other systems have been reviewed and were otherwise negative with the exception of those mentioned in the HPI and as above.  Physical Exam: Constitutional: Alert, well-appearing, no acute distress Ears: External ears without lesions or tenderness. Ear canals are clear bilaterally with intact, clear TMs.  Nasal: External nose without lesions.. Clear nasal passages Oral: Lips and gums without lesions. Tongue and palate mucosa without lesions. Posterior oropharynx clear. Neck: No palpable adenopathy or masses Respiratory: Breathing comfortably  Skin: No facial/neck lesions or rash noted.  Procedures  Assessment: Severe bilateral sensorineural hearing loss History of BPPV  Plan: I discussed with the patient concerning need for hearing aids which need to be upgraded as his present hearing aids are several years old from the [place] system. Also discussed with him concerning the option of physical therapy  for his balance and dizziness but he will call us back if he decides to pursue this.   Radene Journey, MD

## 2019-11-14 ENCOUNTER — Encounter (INDEPENDENT_AMBULATORY_CARE_PROVIDER_SITE_OTHER): Payer: Self-pay

## 2019-11-17 ENCOUNTER — Telehealth: Payer: Self-pay | Admitting: Family Medicine

## 2019-11-17 NOTE — Progress Notes (Signed)
  Chronic Care Management   Note  11/17/2019 Name: Hector Morris MRN: [id number] DOB: 10-May-1945  Hector Morris is a 74 y.o. year old male who is a primary care patient of [name], [name], MD. I reached out to [place] by phone today in response to a referral sent by [name]'s PCP, Caren Macadam, MD.   Hector Morris was given information about Chronic Care Management services today including:  1. CCM service includes personalized support from designated clinical staff supervised by his physician, including individualized plan of care and coordination with other care providers 2. 24/7 contact phone numbers for assistance for urgent and routine care needs. 3. Service will only be billed when office clinical staff spend 20 minutes or more in a month to coordinate care. 4. Only one practitioner may furnish and bill the service in a calendar month. 5. The patient may stop CCM services at any time (effective at the end of the month) by phone call to the office staff.   Patient wishes to consider information provided and/or speak with a member of the care team before deciding about enrollment in care management services.   Follow up plan:   Carley Perdue [employer] Scheduler

## 2019-11-27 ENCOUNTER — Telehealth: Payer: Self-pay | Admitting: Family Medicine

## 2019-11-27 NOTE — Telephone Encounter (Signed)
Drue Dun is calling in from [facility] wanting to see if the office notes stating when and why the pt was put on levothyroxine (SYNTHROID) 25 MCG.  Information can be faxed to [phone])  Attn: Stacy Gardner.  So that the medication can be filled.

## 2019-11-27 NOTE — Telephone Encounter (Signed)
No answer at the number below as there needs to be a signed release from the pt prior to records being sent.

## 2020-01-01 ENCOUNTER — Telehealth: Payer: Self-pay | Admitting: Internal Medicine

## 2020-01-01 DIAGNOSIS — M1711 Unilateral primary osteoarthritis, right knee: Secondary | ICD-10-CM | POA: Diagnosis not present

## 2020-01-01 NOTE — Telephone Encounter (Signed)
Patient states his insurance is inquiring about the size of his aortic aneurysm. Please advise.

## 2020-01-01 NOTE — Telephone Encounter (Signed)
Left message for patient to call back. Last measurement was 4.2cm for aneurysm of ascending aorta per result note.

## 2020-01-01 NOTE — Telephone Encounter (Signed)
encounter not needed 

## 2020-01-01 NOTE — Telephone Encounter (Signed)
Spoke with patient. Informed patient of last measurement. Patient verbalized understanding.

## 2020-02-19 ENCOUNTER — Encounter: Payer: Medicare Other | Admitting: Family Medicine

## 2020-03-01 ENCOUNTER — Telehealth: Payer: Self-pay | Admitting: Family Medicine

## 2020-03-01 NOTE — Telephone Encounter (Signed)
Left message for patient to call back and schedule Medicare Annual Wellness Visit (AWV) either virtually or in office.   Last AWV no information please schedule at anytime with [facility] Nurse Health Advisor 1 or 2   This should be a 45 minute visit. 

## 2020-04-08 DIAGNOSIS — M25561 Pain in right knee: Secondary | ICD-10-CM | POA: Diagnosis not present

## 2020-04-08 DIAGNOSIS — M25461 Effusion, right knee: Secondary | ICD-10-CM | POA: Diagnosis not present

## 2020-04-08 DIAGNOSIS — M1711 Unilateral primary osteoarthritis, right knee: Secondary | ICD-10-CM | POA: Diagnosis not present

## 2020-04-30 ENCOUNTER — Telehealth: Payer: Self-pay

## 2020-04-30 NOTE — Telephone Encounter (Signed)
° °  [facility] Pre-operative Risk Assessment    Request for surgical clearance:  1. What type of surgery is being performed? PHACO W/PC IOL (cataract surgery via phacoemulsification with intraocular lens implantation)  2. When is this surgery scheduled? 05-21-2020 AND 05-28-2020   3. What type of clearance is required (medical clearance vs. Pharmacy clearance to hold med vs. Both)? MEDICAL  4. Are there any medications that need to be held prior to surgery and how long? NONE   5. Practice name and name of physician performing surgery? [facility]  TAD Meryl Crutch, MD  ATTN:CANDICE   6. What is the office phone number? [phone]   7.   What is the office fax number? [phone]  8.   Anesthesia type (None, local, MAC, general) ? LOCAL W/LIGHT SEDATION

## 2020-04-30 NOTE — Telephone Encounter (Signed)
° °  Primary Cardiologist: Pixie Casino, MD  Chart reviewed as part of pre-operative protocol coverage. Cataract extractions are recognized in guidelines as low risk surgeries that do not typically require specific preoperative testing or holding of blood thinner therapy. Therefore, given past medical history and time since last visit, based on ACC/AHA guidelines, Yohance Savan Ruta would be at acceptable risk for the planned procedure without further cardiovascular testing.   I will route this recommendation to the requesting party via Epic fax function and remove from pre-op pool.  Please call with questions.  Tami Lin Marysue Fait, PA 04/30/2020, 11:36 AM

## 2020-05-03 ENCOUNTER — Other Ambulatory Visit: Payer: Self-pay

## 2020-05-03 ENCOUNTER — Ambulatory Visit (INDEPENDENT_AMBULATORY_CARE_PROVIDER_SITE_OTHER): Payer: Medicare Other | Admitting: Family Medicine

## 2020-05-03 ENCOUNTER — Encounter: Payer: Self-pay | Admitting: Family Medicine

## 2020-05-03 VITALS — BP 118/72 | HR 84 | Temp 97.7°F | Ht 69.25 in | Wt 219.9 lb

## 2020-05-03 DIAGNOSIS — E559 Vitamin D deficiency, unspecified: Secondary | ICD-10-CM | POA: Diagnosis not present

## 2020-05-03 DIAGNOSIS — R350 Frequency of micturition: Secondary | ICD-10-CM | POA: Diagnosis not present

## 2020-05-03 DIAGNOSIS — E039 Hypothyroidism, unspecified: Secondary | ICD-10-CM

## 2020-05-03 DIAGNOSIS — G4730 Sleep apnea, unspecified: Secondary | ICD-10-CM | POA: Diagnosis not present

## 2020-05-03 DIAGNOSIS — G629 Polyneuropathy, unspecified: Secondary | ICD-10-CM

## 2020-05-03 DIAGNOSIS — R252 Cramp and spasm: Secondary | ICD-10-CM | POA: Diagnosis not present

## 2020-05-03 DIAGNOSIS — E538 Deficiency of other specified B group vitamins: Secondary | ICD-10-CM | POA: Diagnosis not present

## 2020-05-03 DIAGNOSIS — R790 Abnormal level of blood mineral: Secondary | ICD-10-CM

## 2020-05-03 DIAGNOSIS — R739 Hyperglycemia, unspecified: Secondary | ICD-10-CM | POA: Diagnosis not present

## 2020-05-03 DIAGNOSIS — D649 Anemia, unspecified: Secondary | ICD-10-CM

## 2020-05-03 DIAGNOSIS — E785 Hyperlipidemia, unspecified: Secondary | ICD-10-CM | POA: Diagnosis not present

## 2020-05-03 LAB — COMPREHENSIVE METABOLIC PANEL
ALT: 19 U/L (ref 0–53)
AST: 20 U/L (ref 0–37)
Albumin: 4.1 g/dL (ref 3.5–5.2)
Alkaline Phosphatase: 52 U/L (ref 39–117)
BUN: 19 mg/dL (ref 6–23)
CO2: 29 mEq/L (ref 19–32)
Calcium: 10.1 mg/dL (ref 8.4–10.5)
Chloride: 103 mEq/L (ref 96–112)
Creatinine, Ser: 1.31 mg/dL (ref 0.40–1.50)
GFR: 52.82 mL/min — ABNORMAL LOW (ref >60.00)
Glucose, Bld: 84 mg/dL (ref 70–99)
Potassium: 4.5 mEq/L (ref 3.5–5.1)
Sodium: 141 mEq/L (ref 135–145)
Total Bilirubin: 0.6 mg/dL (ref 0.2–1.2)
Total Protein: 6.8 g/dL (ref 6.0–8.3)

## 2020-05-03 LAB — VITAMIN D 25 HYDROXY (VIT D DEFICIENCY, FRACTURES): VITD: 58.72 ng/mL (ref 30.00–100.00)

## 2020-05-03 LAB — LIPID PANEL
Cholesterol: 146 mg/dL (ref 0–200)
HDL: 35 mg/dL — ABNORMAL LOW (ref >39.00)
NonHDL: 110.82
Total CHOL/HDL Ratio: 4
Triglycerides: 279 mg/dL — ABNORMAL HIGH (ref 0.0–149.0)
VLDL: 55.8 mg/dL — ABNORMAL HIGH (ref 0.0–40.0)

## 2020-05-03 LAB — IBC PANEL
Iron: 71 ug/dL (ref 42–165)
Saturation Ratios: 25.6 % (ref 20.0–50.0)
Transferrin: 198 mg/dL — ABNORMAL LOW (ref 212.0–360.0)

## 2020-05-03 LAB — FOLATE: Folate: 18.8 ng/mL (ref >5.9)

## 2020-05-03 LAB — TSH: TSH: 4.98 u[IU]/mL — ABNORMAL HIGH (ref 0.35–4.50)

## 2020-05-03 LAB — PSA: PSA: 2.11 ng/mL (ref 0.10–4.00)

## 2020-05-03 LAB — HEMOGLOBIN A1C: Hgb A1c MFr Bld: 5.8 % (ref 4.6–6.5)

## 2020-05-03 LAB — LDL CHOLESTEROL, DIRECT: Direct LDL: 71 mg/dL

## 2020-05-03 LAB — VITAMIN B12: Vitamin B-12: 415 pg/mL (ref 211–911)

## 2020-05-03 LAB — FERRITIN: Ferritin: 238.6 ng/mL (ref 22.0–322.0)

## 2020-05-03 NOTE — Progress Notes (Signed)
Hector Morris DOB: 06/25/1945 Encounter date: 05/03/2020  This is a 75 y.o. male who presents with Chief Complaint  Patient presents with  . Annual Exam    History of present illness:  Only thing he is worried about is making sure kidney/liver function are ok. He has copies of all his labs but isn't sure what everything means. He does get bloodwork through [place] and has copy of that here with him today.   Wife had worries about him being fatigued. He states that he feels he is normal for age. Enjoys a nap.   He quit drinking anything late in evening which has helped with sleep. Usually 2 trips to bathroom max now. Before that he was getting up every 2 hours. Cut back on coke.   States not using cpap like he should. He gets camps in legs at night.   Allergies  Allergen Reactions  . Bee Venom Anaphylaxis and Swelling  . Penicillins Anaphylaxis and Other (See Comments)    "Mushroom" popped up on hip Has patient had a PCN reaction causing immediate rash, facial/tongue/throat swelling, SOB or lightheadedness with hypotension: Yes Has patient had a PCN reaction causing severe rash involving mucus membranes or skin necrosis: Yes Has patient had a PCN reaction that required hospitalization: Yes - In hospital Has patient had a PCN reaction occurring within the last 10 years: No If all of the above answers are "NO", then may proceed with Cephalosporin use.   . Shellfish Allergy Hives and Swelling    CRAB MEAT > WHELPS  . Latex     UNSPECIFIED REACTION    Current Meds  Medication Sig  . aspirin 81 MG chewable tablet Chew 81 mg by mouth daily.  . capsaicin (ZOSTRIX) 0.025 % cream Apply 1 application topically 2 (two) times daily as needed (for feet).  . carvedilol (COREG) 6.25 MG tablet Take 6.25 mg by mouth 2 (two) times daily with a meal.  . Cholecalciferol (VITAMIN D3) 125 MCG (5000 UT) CAPS Take 5,000 Units by mouth every morning.  . Cyanocobalamin (VITAMIN B-12 PO) Take by mouth  daily.  Marland Kitchen levothyroxine (SYNTHROID) 25 MCG tablet Take 1 tablet (25 mcg total) by mouth daily before breakfast.  . omeprazole (PRILOSEC) 20 MG capsule Take 20 mg by mouth daily.  Marland Kitchen PARoxetine (PAXIL) 40 MG tablet Take 20 mg by mouth daily.  . rosuvastatin (CRESTOR) 40 MG tablet Take 1 tablet (40 mg total) by mouth daily.    Review of Systems  Constitutional: Negative for activity change, appetite change, chills, fatigue, fever and unexpected weight change.  HENT: Negative for congestion, ear pain, hearing loss, sinus pressure, sinus pain, sore throat and trouble swallowing.   Eyes: Negative for pain and visual disturbance.  Respiratory: Negative for cough, chest tightness, shortness of breath and wheezing.   Cardiovascular: Negative for chest pain, palpitations and leg swelling.  Gastrointestinal: Negative for abdominal distention, abdominal pain, blood in stool, constipation, diarrhea, nausea and vomiting.  Genitourinary: Negative for decreased urine volume, difficulty urinating, dysuria, penile pain and testicular pain.  Musculoskeletal: Negative for arthralgias, back pain, joint swelling and myalgias.       Since he was a teenager, he gets muscle cramps. Seem to be worse at night. Don't come with activity. Mustard helps. Potassium and magnesium helped but made him constipated.  Skin: Negative for rash.  Neurological: Negative for dizziness, weakness, numbness and headaches.  Hematological: Negative for adenopathy. Does not bruise/bleed easily.  Psychiatric/Behavioral: Negative for agitation, sleep disturbance  and suicidal ideas. The patient is not nervous/anxious.     Objective:  BP 118/72 (BP Location: Right Arm, Patient Position: Sitting, Cuff Size: Large)   Pulse 84   Temp 97.7 F (36.5 C) (Oral)   Ht 5' 9.25" (1.759 m)   Wt 219 lb 14.4 oz (99.7 kg)   SpO2 96%   BMI 32.24 kg/m   Weight: 219 lb 14.4 oz (99.7 kg)   BP Readings from Last 3 Encounters:  05/03/20 118/72   11/01/19 120/64  09/04/19 (!) 150/76   Wt Readings from Last 3 Encounters:  05/03/20 219 lb 14.4 oz (99.7 kg)  11/01/19 223 lb 12.8 oz (101.5 kg)  09/04/19 221 lb 6.4 oz (100.4 kg)    Physical Exam Constitutional:      General: He is not in acute distress.    Appearance: He is well-developed.  HENT:     Head: Normocephalic and atraumatic.     Right Ear: External ear normal.     Left Ear: External ear normal.     Nose: Nose normal.     Mouth/Throat:     Pharynx: No oropharyngeal exudate.  Eyes:     Conjunctiva/sclera: Conjunctivae normal.     Pupils: Pupils are equal, round, and reactive to light.  Neck:     Thyroid: No thyromegaly.  Cardiovascular:     Rate and Rhythm: Normal rate and regular rhythm.     Heart sounds: Normal heart sounds. No murmur heard. No friction rub. No gallop.   Pulmonary:     Effort: Pulmonary effort is normal. No respiratory distress.     Breath sounds: Normal breath sounds. No stridor. No wheezing or rales.  Abdominal:     General: Bowel sounds are normal.     Palpations: Abdomen is soft.  Musculoskeletal:        General: Normal range of motion.     Cervical back: Neck supple.  Skin:    General: Skin is warm and dry.  Neurological:     Mental Status: He is alert and oriented to person, place, and time.  Psychiatric:        Behavior: Behavior normal.        Thought Content: Thought content normal.        Judgment: Judgment normal.     Assessment/Plan 1. Hypothyroidism, unspecified type Continue with Synthroid 25 mcg daily.  Recheck blood work today. - TSH; Future  2. Hyperglycemia Discussed importance of regular exercise and healthy eating. - Hemoglobin A1c; Future  3. Hyperlipidemia LDL goal <70 Continue Crestor 10 mg daily.  Recheck blood work today. - Comprehensive metabolic panel; Future - Lipid panel; Future  4. Sleep apnea, unspecified type Stressed importance of sleeping with CPAP.  5. Leg cramps We will check some  blood work today.  Looking for any help for leg cramps for him.  6. Anemia, unspecified type - CBC with Differential/Platelet; Future - Ferritin; Future - Iron and TIBC; Future  7. Vitamin D deficiency - VITAMIN D 25 Hydroxy (Vit-D Deficiency, Fractures); Future  8. B12 deficiency - Vitamin B12; Future - Folate; Future  9. Neuropathy If lab work is all normal could consider trial gabapentin to see if this helps with leg cramps at night as well as his neuropathy.  10. Low magnesium level - Magnesium, RBC; Future  11. Urinary frequency - PSA; Future    Return in about 6 months (around 11/03/2020) for Chronic condition visit.     Micheline Rough, MD

## 2020-05-03 NOTE — Patient Instructions (Signed)
We are going to work on treating leg cramps; hopefully this will help with sleep and your compliance with CPAP.   I will let you know about bloodwork once I get all results - some of the labs I ordered take a little longer, but you should hear from me in about a week.   Ask your eye doctor if flonase or atrovent nasal spray would be ok to take.

## 2020-05-08 LAB — MAGNESIUM, RBC: Magnesium RBC: 3.3 mg/dL — ABNORMAL LOW (ref 4.0–6.4)

## 2020-05-10 NOTE — Addendum Note (Signed)
Addended by: Agnes Lawrence on: 05/10/2020 11:19 AM   Modules accepted: Orders

## 2020-05-15 ENCOUNTER — Encounter: Payer: Self-pay | Admitting: *Deleted

## 2020-05-21 HISTORY — PX: EYE SURGERY: SHX253

## 2020-07-10 ENCOUNTER — Telehealth: Payer: Self-pay | Admitting: Family Medicine

## 2020-07-10 NOTE — Telephone Encounter (Signed)
Left message for patient to call back and schedule Medicare Annual Wellness Visit (AWV) either virtually or in office.   AWV-I per PALMETTO 08/23/09 please schedule at anytime with [facility] Nurse Health Advisor 1 or 2   This should be a 45 minute visit.  

## 2020-07-15 DIAGNOSIS — M1711 Unilateral primary osteoarthritis, right knee: Secondary | ICD-10-CM | POA: Diagnosis not present

## 2020-08-16 ENCOUNTER — Other Ambulatory Visit (INDEPENDENT_AMBULATORY_CARE_PROVIDER_SITE_OTHER): Payer: Medicare Other

## 2020-08-16 ENCOUNTER — Other Ambulatory Visit: Payer: Self-pay

## 2020-08-16 DIAGNOSIS — D649 Anemia, unspecified: Secondary | ICD-10-CM

## 2020-08-16 DIAGNOSIS — E039 Hypothyroidism, unspecified: Secondary | ICD-10-CM | POA: Diagnosis not present

## 2020-08-16 LAB — CBC WITH DIFFERENTIAL/PLATELET
Basophils Absolute: 0.1 10*3/uL (ref 0.0–0.1)
Basophils Relative: 0.9 % (ref 0.0–3.0)
Eosinophils Absolute: 0.3 10*3/uL (ref 0.0–0.7)
Eosinophils Relative: 3 % (ref 0.0–5.0)
HCT: 38.7 % — ABNORMAL LOW (ref 39.0–52.0)
Hemoglobin: 13.3 g/dL (ref 13.0–17.0)
Lymphocytes Relative: 13.6 % (ref 12.0–46.0)
Lymphs Abs: 1.3 10*3/uL (ref 0.7–4.0)
MCHC: 34.4 g/dL (ref 30.0–36.0)
MCV: 89.4 fl (ref 78.0–100.0)
Monocytes Absolute: 0.7 10*3/uL (ref 0.1–1.0)
Monocytes Relative: 8 % (ref 3.0–12.0)
Neutro Abs: 6.9 10*3/uL (ref 1.4–7.7)
Neutrophils Relative %: 74.5 % (ref 43.0–77.0)
Platelets: 245 10*3/uL (ref 150.0–400.0)
RBC: 4.33 Mil/uL (ref 4.22–5.81)
RDW: 13.4 % (ref 11.5–15.5)
WBC: 9.3 10*3/uL (ref 4.0–10.5)

## 2020-08-16 LAB — TSH: TSH: 4.06 u[IU]/mL (ref 0.35–4.50)

## 2020-08-29 ENCOUNTER — Telehealth: Payer: Self-pay | Admitting: Family Medicine

## 2020-08-29 NOTE — Telephone Encounter (Signed)
Pt is calling in stating that he lives in [place], [place] and do not want to do it and feels that Dr. Ethlyn Gallery can do all that he needs done.

## 2020-08-29 NOTE — Telephone Encounter (Signed)
Please remove PCP patient moved to [place]

## 2020-08-29 NOTE — Telephone Encounter (Signed)
Left message for patient to call back and schedule Medicare Annual Wellness Visit (AWV) either virtually or in office.   AWV-I per PALMETTO 08/23/09 please schedule at anytime with [facility] Nurse Health Advisor 1 or 2   This should be a 45 minute visit.

## 2020-09-16 ENCOUNTER — Encounter: Payer: Self-pay | Admitting: Internal Medicine

## 2020-09-20 ENCOUNTER — Ambulatory Visit (HOSPITAL_COMMUNITY)
Admission: RE | Admit: 2020-09-20 | Discharge: 2020-09-20 | Disposition: A | Discharge status: Home / Self Care | Payer: Medicare Other | Source: Ambulatory Visit | Attending: Internal Medicine | Admitting: Internal Medicine

## 2020-09-20 ENCOUNTER — Other Ambulatory Visit: Payer: Self-pay

## 2020-09-20 DIAGNOSIS — I712 Thoracic aortic aneurysm, without rupture, unspecified: Secondary | ICD-10-CM

## 2020-09-20 DIAGNOSIS — J9811 Atelectasis: Secondary | ICD-10-CM | POA: Diagnosis not present

## 2020-09-20 DIAGNOSIS — M439 Deforming dorsopathy, unspecified: Secondary | ICD-10-CM | POA: Diagnosis not present

## 2020-09-20 DIAGNOSIS — I2510 Atherosclerotic heart disease of native coronary artery without angina pectoris: Secondary | ICD-10-CM | POA: Diagnosis not present

## 2020-09-20 LAB — POCT I-STAT CREATININE: Creatinine, Ser: 1.4 mg/dL — ABNORMAL HIGH (ref 0.61–1.24)

## 2020-09-20 MED ORDER — IOHEXOL 350 MG/ML SOLN
100.0000 mL | Freq: Once | INTRAVENOUS | Status: AC | PRN
  Administered 2020-09-20: 100 mL via INTRAVENOUS

## 2020-09-25 ENCOUNTER — Other Ambulatory Visit: Payer: Self-pay

## 2020-09-25 DIAGNOSIS — I714 Abdominal aortic aneurysm, without rupture, unspecified: Secondary | ICD-10-CM

## 2020-10-14 ENCOUNTER — Ambulatory Visit (HOSPITAL_COMMUNITY)
Admission: RE | Admit: 2020-10-14 | Discharge: 2020-10-14 | Disposition: A | Discharge status: Home / Self Care | Payer: Medicare Other | Source: Ambulatory Visit | Attending: Surgery | Admitting: Surgery

## 2020-10-14 ENCOUNTER — Ambulatory Visit (INDEPENDENT_AMBULATORY_CARE_PROVIDER_SITE_OTHER): Payer: Medicare Other | Admitting: Physician Assistant

## 2020-10-14 VITALS — BP 130/79 | HR 67 | Temp 97.6°F | Resp 18 | Ht 70.0 in | Wt 218.0 lb

## 2020-10-14 DIAGNOSIS — Z95828 Presence of other vascular implants and grafts: Secondary | ICD-10-CM | POA: Diagnosis not present

## 2020-10-14 DIAGNOSIS — I714 Abdominal aortic aneurysm, without rupture, unspecified: Secondary | ICD-10-CM

## 2020-10-14 NOTE — Progress Notes (Signed)
Office Note     CC:  follow up Requesting Provider:  Darreld Mclean, MD  HPI: Hector Morris is a 75 y.o. (1945-11-26) male who presents who presents for routine follow-up of E EVAR done on 04/21/2017 by Dr. Trula Slade.  He denies back pain, abdominal pain or lower extremity pain. He is compliant with aspirin and statin.   Past Medical History:  Diagnosis Date   Allergy    Anxiety    Arthritis    Dyspnea    with exertion   GERD (gastroesophageal reflux disease)    History of hiatal hernia    History of kidney stones    Hx of colonic polyps 03/31/2011   Hyperlipidemia    Neuromuscular disorder (HCC)    hiatal hernia   Sleep apnea    cpap    Past Surgical History:  Procedure Laterality Date   ABDOMINAL AORTIC ENDOVASCULAR STENT GRAFT N/A 04/21/2017   Procedure: ABDOMINAL AORTIC ENDOVASCULAR STENT GRAFT;  Surgeon: Serafina Mitchell, MD;  Location: [facility] OR;  Service: Vascular;  Laterality: N/A;   APPENDECTOMY     COLONOSCOPY     last 05/2016   ELBOW SURGERY Left    as teenager   HERNIA REPAIR Right    inguinal   KNEE SURGERY Right    scope - cartilage removed   POLYPECTOMY  2018   TONSILLECTOMY      Social History   Socioeconomic History   Marital status: Married    Spouse name: Not on file   Number of children: Not on file   Years of education: Not on file   Highest education level: Not on file  Occupational History   Not on file  Tobacco Use   Smoking status: Former    Packs/day: 1.50    Years: 39.00    Pack years: 58.50    Types: Cigarettes    Quit date: 02/23/2006    Years since quitting: 14.6   Smokeless tobacco: Former    Types: Nurse, children's Use: Never used  Substance and Sexual Activity   Alcohol use: Yes    Comment: occasional   Drug use: No   Sexual activity: Not on file  Other Topics Concern   Not on file  Social History Narrative   Not on file   Social Determinants of Health   Financial Resource Strain: Not on file  Food  Insecurity: Not on file  Transportation Needs: Not on file  Physical Activity: Not on file  Stress: Not on file  Social Connections: Not on file  Intimate Partner Violence: Not on file   Family History  Problem Relation Age of Onset   Heart murmur Mother    Heart attack Father    Stroke Sister    Obesity Brother    Colon cancer Neg Hx    Colon polyps Neg Hx     Current Outpatient Medications  Medication Sig Dispense Refill   aspirin 81 MG chewable tablet Chew 81 mg by mouth daily.     capsaicin (ZOSTRIX) 0.025 % cream Apply 1 application topically 2 (two) times daily as needed (for feet).     carvedilol (COREG) 6.25 MG tablet Take 6.25 mg by mouth 2 (two) times daily with a meal.     Cholecalciferol (VITAMIN D3) 125 MCG (5000 UT) CAPS Take 5,000 Units by mouth every morning.     Cyanocobalamin (VITAMIN B-12 PO) Take by mouth daily.     levothyroxine (SYNTHROID) 25 MCG  tablet Take 1 tablet (25 mcg total) by mouth daily before breakfast. 90 tablet 1   omeprazole (PRILOSEC) 20 MG capsule Take 20 mg by mouth daily.     PARoxetine (PAXIL) 40 MG tablet Take 20 mg by mouth daily.     rosuvastatin (CRESTOR) 40 MG tablet Take 1 tablet (40 mg total) by mouth daily. 90 tablet 3   No current facility-administered medications for this visit.    Allergies  Allergen Reactions   Bee Venom Anaphylaxis and Swelling   Penicillins Anaphylaxis and Other (See Comments)    "Mushroom" popped up on hip Has patient had a PCN reaction causing immediate rash, facial/tongue/throat swelling, SOB or lightheadedness with hypotension: Yes Has patient had a PCN reaction causing severe rash involving mucus membranes or skin necrosis: Yes Has patient had a PCN reaction that required hospitalization: Yes - In hospital Has patient had a PCN reaction occurring within the last 10 years: No If all of the above answers are "NO", then may proceed with Cephalosporin use.    Shellfish Allergy Hives and Swelling     CRAB MEAT > WHELPS   Latex     UNSPECIFIED REACTION      REVIEW OF SYSTEMS:   '[X]'$  denotes positive finding, '[ ]'$  denotes negative finding Cardiac  Comments:  Chest pain or chest pressure:    Shortness of breath upon exertion:    Short of breath when lying flat:    Irregular heart rhythm:        Vascular    Pain in calf, thigh, or hip brought on by ambulation:    Pain in feet at night that wakes you up from your sleep:     Blood clot in your veins:    Leg swelling:         Pulmonary    Oxygen at home:    Productive cough:     Wheezing:         Neurologic    Sudden weakness in arms or legs:     Sudden numbness in arms or legs:     Sudden onset of difficulty speaking or slurred speech:    Temporary loss of vision in one eye:     Problems with dizziness:         Gastrointestinal    Blood in stool:     Vomited blood:         Genitourinary    Burning when urinating:     Blood in urine:        Psychiatric    Major depression:         Hematologic    Bleeding problems:    Problems with blood clotting too easily:        Skin    Rashes or ulcers:        Constitutional    Fever or chills:      PHYSICAL EXAMINATION:  There were no vitals filed for this visit.  General:  WDWN in NAD; vital signs documented above Gait: Not observed HENT: WNL, normocephalic Pulmonary: normal non-labored breathing , without Rales, rhonchi,  wheezing Cardiac: regular HR, without  Murmurs without carotid bruit Abdomen: soft, NT, no masses Skin: without rashes Vascular Exam/Pulses: 2+ right dorsalis pedis pulse, 2+ left posterior tibial pulse Extremities: without ischemic changes, without Gangrene , without cellulitis; without open wounds;  Musculoskeletal: no muscle wasting or atrophy  Neurologic: A&O X 3;  No focal weakness or paresthesias are detected Psychiatric:  The pt has Normal  affect.   Non-Invasive Vascular Imaging:   10/14/2020  Maximal aortic diameter 4.0 cm.  Right  common iliac artery 1.17 cm.  Left common iliac artery 1.2 cm.   ASSESSMENT/PLAN:: 75 y.o. male here for follow up for EVAR.  No symptoms referable to aorta.  Maximal diameter of aorta is decreasing in size.  He has history of thoracic aneurysm and underwent CTA on 09/20/2020.  This is followed by Dr. Debara Pickett.  Greatest estimated diameter of ascending aorta, 4.1 cm, unchanged. Follow-up in 1 year with aorta duplex.   Barbie Banner, PA-C [facility] [phone]  Clinic MD:   Trula Slade

## 2020-10-14 NOTE — Progress Notes (Deleted)
Office Note     CC:  follow up Requesting Provider:  Darreld Mclean, MD  HPI: Hector Morris is a 75 y.o. (08-03-1945) male who presents for routine follow-up of EVAR done on 04/21/2017 by Dr. Trula Slade. He denies back pain , abdominal pain or LE pain.  Compliant with asa and statin.  Past Medical History:  Diagnosis Date   Allergy    Anxiety    Arthritis    Dyspnea    with exertion   GERD (gastroesophageal reflux disease)    History of hiatal hernia    History of kidney stones    Hx of colonic polyps 03/31/2011   Hyperlipidemia    Neuromuscular disorder (HCC)    hiatal hernia   Sleep apnea    cpap    Past Surgical History:  Procedure Laterality Date   ABDOMINAL AORTIC ENDOVASCULAR STENT GRAFT N/A 04/21/2017   Procedure: ABDOMINAL AORTIC ENDOVASCULAR STENT GRAFT;  Surgeon: Serafina Mitchell, MD;  Location: [facility] OR;  Service: Vascular;  Laterality: N/A;   APPENDECTOMY     COLONOSCOPY     last 05/2016   ELBOW SURGERY Left    as teenager   HERNIA REPAIR Right    inguinal   KNEE SURGERY Right    scope - cartilage removed   POLYPECTOMY  2018   TONSILLECTOMY      Social History   Socioeconomic History   Marital status: Married    Spouse name: Not on file   Number of children: Not on file   Years of education: Not on file   Highest education level: Not on file  Occupational History   Not on file  Tobacco Use   Smoking status: Former    Packs/day: 1.50    Years: 39.00    Pack years: 58.50    Types: Cigarettes    Quit date: 02/23/2006    Years since quitting: 14.6   Smokeless tobacco: Former    Types: Nurse, children's Use: Never used  Substance and Sexual Activity   Alcohol use: Yes    Comment: occasional   Drug use: No   Sexual activity: Not on file  Other Topics Concern   Not on file  Social History Narrative   Not on file   Social Determinants of Health   Financial Resource Strain: Not on file  Food Insecurity: Not on file   Transportation Needs: Not on file  Physical Activity: Not on file  Stress: Not on file  Social Connections: Not on file  Intimate Partner Violence: Not on file   Family History  Problem Relation Age of Onset   Heart murmur Mother    Heart attack Father    Stroke Sister    Obesity Brother    Colon cancer Neg Hx    Colon polyps Neg Hx     Current Outpatient Medications  Medication Sig Dispense Refill   aspirin 81 MG chewable tablet Chew 81 mg by mouth daily.     capsaicin (ZOSTRIX) 0.025 % cream Apply 1 application topically 2 (two) times daily as needed (for feet).     carvedilol (COREG) 6.25 MG tablet Take 6.25 mg by mouth 2 (two) times daily with a meal.     Cholecalciferol (VITAMIN D3) 125 MCG (5000 UT) CAPS Take 5,000 Units by mouth every morning.     Cyanocobalamin (VITAMIN B-12 PO) Take by mouth daily.     levothyroxine (SYNTHROID) 25 MCG tablet Take 1 tablet (25 mcg  total) by mouth daily before breakfast. 90 tablet 1   omeprazole (PRILOSEC) 20 MG capsule Take 20 mg by mouth daily.     PARoxetine (PAXIL) 40 MG tablet Take 20 mg by mouth daily.     rosuvastatin (CRESTOR) 40 MG tablet Take 1 tablet (40 mg total) by mouth daily. 90 tablet 3   No current facility-administered medications for this visit.    Allergies  Allergen Reactions   Bee Venom Anaphylaxis and Swelling   Penicillins Anaphylaxis and Other (See Comments)    "Mushroom" popped up on hip Has patient had a PCN reaction causing immediate rash, facial/tongue/throat swelling, SOB or lightheadedness with hypotension: Yes Has patient had a PCN reaction causing severe rash involving mucus membranes or skin necrosis: Yes Has patient had a PCN reaction that required hospitalization: Yes - In hospital Has patient had a PCN reaction occurring within the last 10 years: No If all of the above answers are "NO", then may proceed with Cephalosporin use.    Shellfish Allergy Hives and Swelling    CRAB MEAT > WHELPS    Latex     UNSPECIFIED REACTION      REVIEW OF SYSTEMS:   '[X]'$  denotes positive finding, '[ ]'$  denotes negative finding Cardiac  Comments:  Chest pain or chest pressure:    Shortness of breath upon exertion:    Short of breath when lying flat:    Irregular heart rhythm:        Vascular    Pain in calf, thigh, or hip brought on by ambulation:    Pain in feet at night that wakes you up from your sleep:     Blood clot in your veins:    Leg swelling:         Pulmonary    Oxygen at home:    Productive cough:     Wheezing:         Neurologic    Sudden weakness in arms or legs:     Sudden numbness in arms or legs:     Sudden onset of difficulty speaking or slurred speech:    Temporary loss of vision in one eye:     Problems with dizziness:         Gastrointestinal    Blood in stool:     Vomited blood:         Genitourinary    Burning when urinating:     Blood in urine:        Psychiatric    Major depression:         Hematologic    Bleeding problems:    Problems with blood clotting too easily:        Skin    Rashes or ulcers:        Constitutional    Fever or chills:      PHYSICAL EXAMINATION:  There were no vitals filed for this visit.  General:  WDWN in NAD; vital signs documented above Gait: Not observed HENT: WNL, normocephalic Pulmonary: normal non-labored breathing , without Rales, rhonchi,  wheezing Cardiac: regular HR, without  Murmurs without carotid bruit Abdomen: soft, NT, no masses Skin: without rashes Vascular Exam/Pulses: 2+ right DP pulse and 2+ left PT pulse. Extremities: without ischemic changes, without Gangrene , without cellulitis; without open wounds;  Musculoskeletal: no muscle wasting or atrophy  Neurologic: A&O X 3;  No focal weakness or paresthesias are detected Psychiatric:  The pt has Normal affect.   Non-Invasive Vascular Imaging:  Maximum abdominal aortic diameter: 4.0 cm  ASSESSMENT/PLAN:: 75 y.o. male here for follow up  for EVAR. Decreasing diameter of aorta. No symptoms referable to AAA. Thoracic aneurysm followed by Dr. Debara Pickett. Had CTA on 09/20/2020>>Greatest estimated diameter of the ascending aorta, 4.1 cm, unchanged. Follow-up in one year with aorta duplex.   Barbie Banner, PA-C [facility] [phone]  Clinic MD:   Trula Slade  Office Note     CC:  follow up Requesting Provider:  Darreld Mclean, MD  HPI: Hector Morris is a 75 y.o. (17-Oct-1945) male who presents for routine follow-up of EVAR by Dr. Trula Slade on 04/21/2017.   Past Medical History:  Diagnosis Date   Allergy    Anxiety    Arthritis    Dyspnea    with exertion   GERD (gastroesophageal reflux disease)    History of hiatal hernia    History of kidney stones    Hx of colonic polyps 03/31/2011   Hyperlipidemia    Neuromuscular disorder (HCC)    hiatal hernia   Sleep apnea    cpap    Past Surgical History:  Procedure Laterality Date   ABDOMINAL AORTIC ENDOVASCULAR STENT GRAFT N/A 04/21/2017   Procedure: ABDOMINAL AORTIC ENDOVASCULAR STENT GRAFT;  Surgeon: Serafina Mitchell, MD;  Location: [facility] OR;  Service: Vascular;  Laterality: N/A;   APPENDECTOMY     COLONOSCOPY     last 05/2016   ELBOW SURGERY Left    as teenager   HERNIA REPAIR Right    inguinal   KNEE SURGERY Right    scope - cartilage removed   POLYPECTOMY  2018   TONSILLECTOMY      Social History   Socioeconomic History   Marital status: Married    Spouse name: Not on file   Number of children: Not on file   Years of education: Not on file   Highest education level: Not on file  Occupational History   Not on file  Tobacco Use   Smoking status: Former    Packs/day: 1.50    Years: 39.00    Pack years: 58.50    Types: Cigarettes    Quit date: 02/23/2006    Years since quitting: 14.6   Smokeless tobacco: Former    Types: Nurse, children's Use: Never used  Substance and Sexual Activity   Alcohol use: Yes    Comment:  occasional   Drug use: No   Sexual activity: Not on file  Other Topics Concern   Not on file  Social History Narrative   Not on file   Social Determinants of Health   Financial Resource Strain: Not on file  Food Insecurity: Not on file  Transportation Needs: Not on file  Physical Activity: Not on file  Stress: Not on file  Social Connections: Not on file  Intimate Partner Violence: Not on file   Family History  Problem Relation Age of Onset   Heart murmur Mother    Heart attack Father    Stroke Sister    Obesity Brother    Colon cancer Neg Hx    Colon polyps Neg Hx     Current Outpatient Medications  Medication Sig Dispense Refill   aspirin 81 MG chewable tablet Chew 81 mg by mouth daily.     capsaicin (ZOSTRIX) 0.025 % cream Apply 1 application topically 2 (two) times daily as needed (for feet).     carvedilol (COREG) 6.25 MG tablet Take 6.25  mg by mouth 2 (two) times daily with a meal.     Cholecalciferol (VITAMIN D3) 125 MCG (5000 UT) CAPS Take 5,000 Units by mouth every morning.     Cyanocobalamin (VITAMIN B-12 PO) Take by mouth daily.     levothyroxine (SYNTHROID) 25 MCG tablet Take 1 tablet (25 mcg total) by mouth daily before breakfast. 90 tablet 1   omeprazole (PRILOSEC) 20 MG capsule Take 20 mg by mouth daily.     PARoxetine (PAXIL) 40 MG tablet Take 20 mg by mouth daily.     rosuvastatin (CRESTOR) 40 MG tablet Take 1 tablet (40 mg total) by mouth daily. 90 tablet 3   No current facility-administered medications for this visit.    Allergies  Allergen Reactions   Bee Venom Anaphylaxis and Swelling   Penicillins Anaphylaxis and Other (See Comments)    "Mushroom" popped up on hip Has patient had a PCN reaction causing immediate rash, facial/tongue/throat swelling, SOB or lightheadedness with hypotension: Yes Has patient had a PCN reaction causing severe rash involving mucus membranes or skin necrosis: Yes Has patient had a PCN reaction that required  hospitalization: Yes - In hospital Has patient had a PCN reaction occurring within the last 10 years: No If all of the above answers are "NO", then may proceed with Cephalosporin use.    Shellfish Allergy Hives and Swelling    CRAB MEAT > WHELPS   Latex     UNSPECIFIED REACTION      REVIEW OF SYSTEMS:   '[X]'$  denotes positive finding, '[ ]'$  denotes negative finding Cardiac  Comments:  Chest pain or chest pressure:    Shortness of breath upon exertion:    Short of breath when lying flat:    Irregular heart rhythm:        Vascular    Pain in calf, thigh, or hip brought on by ambulation:    Pain in feet at night that wakes you up from your sleep:     Blood clot in your veins:    Leg swelling:         Pulmonary    Oxygen at home:    Productive cough:     Wheezing:         Neurologic    Sudden weakness in arms or legs:     Sudden numbness in arms or legs:     Sudden onset of difficulty speaking or slurred speech:    Temporary loss of vision in one eye:     Problems with dizziness:         Gastrointestinal    Blood in stool:     Vomited blood:         Genitourinary    Burning when urinating:     Blood in urine:        Psychiatric    Major depression:         Hematologic    Bleeding problems:    Problems with blood clotting too easily:        Skin    Rashes or ulcers:        Constitutional    Fever or chills:      PHYSICAL EXAMINATION:  There were no vitals filed for this visit.  General:  WDWN in NAD; vital signs documented above Gait: Not observed HENT: WNL, normocephalic Pulmonary: normal non-labored breathing , without Rales, rhonchi,  wheezing Cardiac: {Desc; regular/irreg:14544} HR, without  Murmurs {With/Without:20273} carotid bruit Abdomen: soft, NT, no masses Skin: {With/Without:20273} rashes  Vascular Exam/Pulses: Extremities: {With/Without:20273} ischemic changes, {With/Without:20273} Gangrene , {With/Without:20273} cellulitis;  {With/Without:20273} open wounds;  Musculoskeletal: no muscle wasting or atrophy  Neurologic: A&O X 3;  No focal weakness or paresthesias are detected Psychiatric:  The pt has {Desc; normal/abnormal:11317::"Normal"} affect.   Non-Invasive Vascular Imaging:   ***    ASSESSMENT/PLAN:: 75 y.o. male here for follow up for ***    Barbie Banner, PA-C [facility] MD:   ***

## 2020-10-15 ENCOUNTER — Other Ambulatory Visit: Payer: Self-pay

## 2020-10-21 ENCOUNTER — Ambulatory Visit (INDEPENDENT_AMBULATORY_CARE_PROVIDER_SITE_OTHER): Payer: Medicare Other | Admitting: Otolaryngology

## 2020-10-21 ENCOUNTER — Other Ambulatory Visit: Payer: Self-pay

## 2020-10-21 DIAGNOSIS — H60311 Diffuse otitis externa, right ear: Secondary | ICD-10-CM

## 2020-10-21 DIAGNOSIS — K219 Gastro-esophageal reflux disease without esophagitis: Secondary | ICD-10-CM | POA: Diagnosis not present

## 2020-10-21 MED ORDER — CORTISPORIN-TC 3.3-3-10-0.5 MG/ML OT SUSP: 4.0000 [drp] | Freq: Three times a day (TID) | OTIC | 0 refills | Status: DC

## 2020-10-21 NOTE — Progress Notes (Signed)
HPI: Hector Morris is a 75 y.o. male who returns today for evaluation of right ear pain that he has had for over a week.  He has tried alcohol vinegar ear rinses and is doing a little bit better today.  He wears bilateral hearing aids that he wears all day long in both ears.  He has had pain discomfort putting the right hearing aid in.  He also complains of some neck pain and discomfort. He also states that he occasionally gets choked on his food and points to the area of the larynx that he feels like it sometimes hangs up.  He has a long history of GE reflux disease and takes an acid medication in the morning. He used to smoke but quit over 10 years ago..  Past Medical History:  Diagnosis Date   Allergy    Anxiety    Arthritis    Dyspnea    with exertion   GERD (gastroesophageal reflux disease)    History of hiatal hernia    History of kidney stones    Hx of colonic polyps 03/31/2011   Hyperlipidemia    Neuromuscular disorder ([place])    hiatal hernia   Sleep apnea    cpap   Past Surgical History:  Procedure Laterality Date   ABDOMINAL AORTIC ENDOVASCULAR STENT GRAFT N/A 04/21/2017   Procedure: ABDOMINAL AORTIC ENDOVASCULAR STENT GRAFT;  Surgeon: Serafina Mitchell, MD;  Location: [facility] OR;  Service: Vascular;  Laterality: N/A;   APPENDECTOMY     COLONOSCOPY     last 05/2016   ELBOW SURGERY Left    as teenager   HERNIA REPAIR Right    inguinal   KNEE SURGERY Right    scope - cartilage removed   POLYPECTOMY  2018   TONSILLECTOMY     Social History   Socioeconomic History   Marital status: Married    Spouse name: Not on file   Number of children: Not on file   Years of education: Not on file   Highest education level: Not on file  Occupational History   Not on file  Tobacco Use   Smoking status: Former    Packs/day: 1.50    Years: 39.00    Pack years: 58.50    Types: Cigarettes    Quit date: 02/23/2006    Years since quitting: 14.6   Smokeless tobacco: Former    Types:  Nurse, children's Use: Never used  Substance and Sexual Activity   Alcohol use: Yes    Comment: occasional   Drug use: No   Sexual activity: Not on file  Other Topics Concern   Not on file  Social History Narrative   Not on file   Social Determinants of Health   Financial Resource Strain: Not on file  Food Insecurity: Not on file  Transportation Needs: Not on file  Physical Activity: Not on file  Stress: Not on file  Social Connections: Not on file   Family History  Problem Relation Age of Onset   Heart murmur Mother    Heart attack Father    Stroke Sister    Obesity Brother    Colon cancer Neg Hx    Colon polyps Neg Hx    Allergies  Allergen Reactions   Bee Venom Anaphylaxis and Swelling   Penicillins Anaphylaxis and Other (See Comments)    "Mushroom" popped up on hip Has patient had a PCN reaction causing immediate rash, facial/tongue/throat swelling, SOB or lightheadedness  with hypotension: Yes Has patient had a PCN reaction causing severe rash involving mucus membranes or skin necrosis: Yes Has patient had a PCN reaction that required hospitalization: Yes - In hospital Has patient had a PCN reaction occurring within the last 10 years: No If all of the above answers are "NO", then may proceed with Cephalosporin use.    Shellfish Allergy Hives and Swelling    CRAB MEAT > WHELPS   Latex     UNSPECIFIED REACTION    Prior to Admission medications   Medication Sig Start Date End Date Taking? Authorizing Provider  aspirin 81 MG chewable tablet Chew 81 mg by mouth daily.    [provider]  capsaicin (ZOSTRIX) 0.025 % cream Apply 1 application topically 2 (two) times daily as needed (for feet).    [provider]  carvedilol (COREG) 6.25 MG tablet Take 6.25 mg by mouth 2 (two) times daily with a meal.    [provider]  Cholecalciferol (VITAMIN D3) 125 MCG (5000 UT) CAPS Take 5,000 Units by mouth every morning.    [provider]  Cyanocobalamin (VITAMIN B-12 PO) Take by mouth daily.    [provider]  levothyroxine (SYNTHROID) 25 MCG tablet Take 1 tablet (25 mcg total) by mouth daily before breakfast. 11/08/19   [name], [name], MD  omeprazole (PRILOSEC) 20 MG capsule Take 20 mg by mouth daily.    [provider]  PARoxetine (PAXIL) 40 MG tablet Take 20 mg by mouth daily.    [provider]  rosuvastatin (CRESTOR) 40 MG tablet Take 1 tablet (40 mg total) by mouth daily. 03/23/17   Hilty, Nadean Corwin, MD     Positive ROS: Otherwise negative  All other systems have been reviewed and were otherwise negative with the exception of those mentioned in the HPI and as above.  Physical Exam: Constitutional: Alert, well-appearing, no acute distress Ears: External ears without lesions or tenderness. Ear canals are clear bilaterally with minimal wax buildup.  However he has diffuse swelling of the lateral portion of the right ear canal compared to the left.  This is tender to palpation and wiggling the ear.  The more medial portion of the ear canal and TM are otherwise clear. Nasal: External nose without lesions. Septum with deformity to the left.  Both middle meatus regions were clear.. Clear nasal passages Oral: Lips and gums without lesions. Tongue and palate mucosa without lesions. Posterior oropharynx clear. Fiberoptic laryngoscopy was performed through the right nostril.  The nasopharynx was clear.  The base of tongue vallecula epiglottis were normal.  Vocal cords were clear with no vocal cord mobility.  He had mild arytenoid edema but clear mucosal membranes otherwise.  Both piriform sinuses were clear. Neck: No palpable adenopathy or masses Respiratory: Breathing comfortably  Skin: No facial/neck lesions or rash noted.  Laryngoscopy  Date/Time: 10/21/2020 2:07 PM Performed by: Rozetta Nunnery, MD Authorized by: Rozetta Nunnery, MD   Consent:    Consent obtained:   Verbal   Consent given by:  Patient Procedure details:    Indications: direct visualization of the upper aerodigestive tract   Sinus:    Right middle meatus: normal     Right nasopharynx: normal   Mouth:    Vallecula: normal     Base of tongue: normal     Epiglottis: normal   Throat:    Pyriform sinus: normal     True vocal cords: normal   Comments:  On fiberoptic laryngoscopy through the right nostril the hypopharynx and larynx was clear.  He had mild edema of the arytenoid mucosa consistent with probable laryngeal pharyngeal reflux but no mucosal abnormalities noted.  Assessment: Right ear pain secondary to right external otitis. He also has some chronic neck problems in the cervical region. Intermittent swallowing problems I suspect are related to laryngeal pharyngeal reflux disease as he has clear hypopharynx and larynx on fiberoptic laryngoscopy.  Plan: Prescribed Cortisporin otic suspension drops 4 to 5 drops in the right ear twice daily for the next 5 days and recommended leaving his hearing aid out of the right ear for the next 5 days. Suggested taking his antacid medication before dinner instead of first thing in the morning as this will provide better nighttime coverage of reflux symptoms. He will follow-up as needed.   Radene Journey, MD

## 2020-11-11 ENCOUNTER — Ambulatory Visit: Payer: Medicare Other | Admitting: Medical

## 2020-11-13 ENCOUNTER — Encounter: Payer: Self-pay | Admitting: Internal Medicine

## 2020-11-13 ENCOUNTER — Other Ambulatory Visit: Payer: Self-pay | Admitting: Internal Medicine

## 2020-11-13 DIAGNOSIS — I712 Thoracic aortic aneurysm, without rupture, unspecified: Secondary | ICD-10-CM

## 2020-11-15 ENCOUNTER — Encounter: Payer: Self-pay | Admitting: Family Medicine

## 2020-11-15 ENCOUNTER — Other Ambulatory Visit: Payer: Self-pay

## 2020-11-15 ENCOUNTER — Ambulatory Visit (INDEPENDENT_AMBULATORY_CARE_PROVIDER_SITE_OTHER): Payer: Medicare Other | Admitting: Family Medicine

## 2020-11-15 VITALS — BP 118/70 | HR 70 | Temp 97.6°F | Ht 70.0 in | Wt 220.7 lb

## 2020-11-15 DIAGNOSIS — E785 Hyperlipidemia, unspecified: Secondary | ICD-10-CM | POA: Diagnosis not present

## 2020-11-15 DIAGNOSIS — I2584 Coronary atherosclerosis due to calcified coronary lesion: Secondary | ICD-10-CM

## 2020-11-15 DIAGNOSIS — G4730 Sleep apnea, unspecified: Secondary | ICD-10-CM

## 2020-11-15 DIAGNOSIS — R61 Generalized hyperhidrosis: Secondary | ICD-10-CM | POA: Diagnosis not present

## 2020-11-15 DIAGNOSIS — I2510 Atherosclerotic heart disease of native coronary artery without angina pectoris: Secondary | ICD-10-CM | POA: Diagnosis not present

## 2020-11-15 NOTE — Progress Notes (Signed)
Hector Morris DOB: Mar 06, 1945 Encounter date: 11/15/2020  This is a 75 y.o. male who presents with Chief Complaint  Patient presents with   Follow-up    History of present illness: Hypothyroid: synthroid 76mcg. Taking properly on empty stomach.   Has been working on healthy eating. Stays busy in yard.   Hyperglycemia: Diet controlled.  Hyperlipidemia: crestor 10mg .  Tolerates this well.  He has always had some cramping in the muscles, see below.  Does not feel that medication is the cause.  Sleep apnea: cpap -he is compliant with this.  Last visit was getting leg cramps - encouraged magnesium. Helped some, but still having cramps at night - coming less often. Gets them behind knees, lower legs, thighs. Goes away quickly with mustard. Gets cramps at least 3 nights/week. Not really getting cramps during day unless in certain position. Drinks a lot of water daily - drinks at least 2-3  20oz water daily. Does state that he has had cramps "my whole life". Even in high school he had these.   Does take ibuprofen for pain when needed. Last shot he had in knee really helped with knee pain. Shoulder still bothers him. Probably takes it a couple of days/week. Neck has bothered him in last week - hurt up back of neck up to right ear. Saw ENT and has been using alcohol and vinegar in ear. Didn't end up using prescription drops. Neck is still a little sore, but not hurting like it was. Much better overall.   Night sweats about once a month. Some cough on and off for past year. More "hack". Had CT angio in 7/29 thorugh cardiology and lungs looked good at that time.   Has follow up coming up with GI; thought that he has some ongoing reflux issues.  Allergies  Allergen Reactions   Bee Venom Anaphylaxis and Swelling   Penicillins Anaphylaxis and Other (See Comments)    "Mushroom" popped up on hip Has patient had a PCN reaction causing immediate rash, facial/tongue/throat swelling, SOB or  lightheadedness with hypotension: Yes Has patient had a PCN reaction causing severe rash involving mucus membranes or skin necrosis: Yes Has patient had a PCN reaction that required hospitalization: Yes - In hospital Has patient had a PCN reaction occurring within the last 10 years: No If all of the above answers are "NO", then may proceed with Cephalosporin use.    Shellfish Allergy Hives and Swelling    CRAB MEAT > WHELPS   Latex     UNSPECIFIED REACTION    Current Meds  Medication Sig   aspirin 81 MG chewable tablet Chew 81 mg by mouth daily.   capsaicin (ZOSTRIX) 0.025 % cream Apply 1 application topically 2 (two) times daily as needed (for feet).   carvedilol (COREG) 6.25 MG tablet Take 6.25 mg by mouth 2 (two) times daily with a meal.   Cholecalciferol (VITAMIN D3) 125 MCG (5000 UT) CAPS Take 5,000 Units by mouth every morning.   Cyanocobalamin (VITAMIN B-12 PO) Take by mouth daily.   levothyroxine (SYNTHROID) 25 MCG tablet Take 1 tablet (25 mcg total) by mouth daily before breakfast.   omeprazole (PRILOSEC) 20 MG capsule Take 20 mg by mouth daily.   PARoxetine (PAXIL) 40 MG tablet Take 20 mg by mouth daily.   rosuvastatin (CRESTOR) 40 MG tablet Take 1 tablet (40 mg total) by mouth daily.   [DISCONTINUED] neomycin-colistin-hydrocortisone-thonzonium (CORTISPORIN-TC) 3.04-25-08-0.5 MG/ML OTIC suspension Place 4 drops into the right ear 3 (three) times daily.  4 to 5 drops in the right ear 3 times daily x5 days.  Leave the hearing aid out and place a cottonball in the ear after the drops for 30 minutes.    Review of Systems  Constitutional:  Negative for chills, fatigue and fever.  HENT:  Positive for congestion (nose also runs; worse after working outside).   Respiratory:  Positive for cough (hacking, ongoing; thought to be reflux related). Negative for chest tightness, shortness of breath and wheezing.   Cardiovascular:  Negative for chest pain, palpitations and leg swelling.   Gastrointestinal:  Negative for abdominal pain, constipation and diarrhea.   Objective:  BP 118/70 (BP Location: Left Arm, Patient Position: Sitting, Cuff Size: Large)   Pulse 70   Temp 97.6 F (36.4 C) (Oral)   Ht 5\' 10"  (1.778 m)   Wt 220 lb 11.2 oz (100.1 kg)   SpO2 95%   BMI 31.67 kg/m   Weight: 220 lb 11.2 oz (100.1 kg)   BP Readings from Last 3 Encounters:  11/15/20 118/70  10/15/20 130/79  05/03/20 118/72   Wt Readings from Last 3 Encounters:  11/15/20 220 lb 11.2 oz (100.1 kg)  10/15/20 218 lb (98.9 kg)  05/03/20 219 lb 14.4 oz (99.7 kg)    Physical Exam Constitutional:      General: He is not in acute distress.    Appearance: He is well-developed.  Cardiovascular:     Rate and Rhythm: Normal rate and regular rhythm.     Heart sounds: Normal heart sounds. No murmur heard.   No friction rub.  Pulmonary:     Effort: Pulmonary effort is normal. No respiratory distress.     Breath sounds: Normal breath sounds. No wheezing or rales.  Musculoskeletal:     Right lower leg: No edema.     Left lower leg: No edema.  Lymphadenopathy:     Cervical: No cervical adenopathy.     Upper Body:     Right upper body: No supraclavicular or axillary adenopathy.     Left upper body: No supraclavicular or axillary adenopathy.     Lower Body: No right inguinal adenopathy. No left inguinal adenopathy.  Neurological:     Mental Status: He is alert and oriented to person, place, and time.  Psychiatric:        Behavior: Behavior normal.    Assessment/Plan  1. Sleep apnea, unspecified type Restart with cpap.   2. Hyperlipidemia LDL goal <70 Continue with Crestor.  We can hold temporarily to see if it helps with leg cramps, but he states he has had cramps for his whole life.  Somewhat better since starting magnesium.  3. Coronary artery calcification He is taking Crestor regularly.  Blood pressures controlled.  4. Night sweats Occasional episode.  Had normal chest scanning  done within the last couple of months.  No lymphadenopathy appreciated on my exam today.  He is due for blood work which she will complete through the [place] next month.  I advised him to let me know if any increased frequency of episodes.  Return in about 6 months (around 05/15/2021) for physical exam.    Micheline Rough, MD

## 2020-11-15 NOTE — Patient Instructions (Addendum)
Consider B12-folic acid combination supplement. You could even try under the tongue drops/dissolving form - these are better absorbed. Ok to take 1000 mcg L87 daily; folic acid 0.5mg .   Please get me a copy of labs from [place] once you complete those.

## 2020-12-06 ENCOUNTER — Ambulatory Visit: Payer: Medicare Other | Admitting: Internal Medicine

## 2020-12-13 ENCOUNTER — Ambulatory Visit (INDEPENDENT_AMBULATORY_CARE_PROVIDER_SITE_OTHER): Payer: Medicare Other | Admitting: Family

## 2020-12-13 ENCOUNTER — Other Ambulatory Visit: Payer: Self-pay

## 2020-12-13 ENCOUNTER — Encounter (HOSPITAL_BASED_OUTPATIENT_CLINIC_OR_DEPARTMENT_OTHER): Payer: Self-pay | Admitting: Family

## 2020-12-13 VITALS — BP 118/64 | HR 71 | Ht 70.0 in | Wt 220.0 lb

## 2020-12-13 DIAGNOSIS — I7121 Aneurysm of the ascending aorta, without rupture: Secondary | ICD-10-CM | POA: Diagnosis not present

## 2020-12-13 DIAGNOSIS — Z95828 Presence of other vascular implants and grafts: Secondary | ICD-10-CM | POA: Diagnosis not present

## 2020-12-13 DIAGNOSIS — R0609 Other forms of dyspnea: Secondary | ICD-10-CM

## 2020-12-13 DIAGNOSIS — E785 Hyperlipidemia, unspecified: Secondary | ICD-10-CM | POA: Diagnosis not present

## 2020-12-13 DIAGNOSIS — I25118 Atherosclerotic heart disease of native coronary artery with other forms of angina pectoris: Secondary | ICD-10-CM

## 2020-12-13 DIAGNOSIS — R072 Precordial pain: Secondary | ICD-10-CM

## 2020-12-13 DIAGNOSIS — R9431 Abnormal electrocardiogram [ECG] [EKG]: Secondary | ICD-10-CM

## 2020-12-13 MED ORDER — METOPROLOL TARTRATE 100 MG PO TABS: ORAL_TABLET | ORAL | 0 refills | Status: DC

## 2020-12-13 NOTE — Progress Notes (Signed)
Office Visit    Patient Name: Hector Morris Date of Encounter: 12/13/2020  PCP:  Caren Macadam, MD   [place]  Cardiologist:  Pixie Casino, MD  Advanced Practice Provider:  No care team member to display Electrophysiologist:  None      Chief Complaint    Hector Morris is a 75 y.o. male with a hx of ascending thoracic aortic aneurysm 4.1 cm, hyperlipidemia, AAA s/p EVAR 2019, hypothyroidism, OSA, nonobstructive CAD presents today for follow up of CAD/TAA   Past Medical History    Past Medical History:  Diagnosis Date   Allergy    Anxiety    Arthritis    Dyspnea    with exertion   GERD (gastroesophageal reflux disease)    History of hiatal hernia    History of kidney stones    Hx of colonic polyps 03/31/2011   Hyperlipidemia    Neuromuscular disorder ([place])    hiatal hernia   Sleep apnea    cpap   Past Surgical History:  Procedure Laterality Date   ABDOMINAL AORTIC ENDOVASCULAR STENT GRAFT N/A 04/21/2017   Procedure: ABDOMINAL AORTIC ENDOVASCULAR STENT GRAFT;  Surgeon: Serafina Mitchell, MD;  Location: [facility] OR;  Service: Vascular;  Laterality: N/A;   APPENDECTOMY     COLONOSCOPY     last 05/2016   ELBOW SURGERY Left    as teenager   HERNIA REPAIR Right    inguinal   KNEE SURGERY Right    scope - cartilage removed   POLYPECTOMY  2018   TONSILLECTOMY      Allergies  Allergies  Allergen Reactions   Bee Venom Anaphylaxis and Swelling   Penicillins Anaphylaxis and Other (See Comments)    "Mushroom" popped up on hip Has patient had a PCN reaction causing immediate rash, facial/tongue/throat swelling, SOB or lightheadedness with hypotension: Yes Has patient had a PCN reaction causing severe rash involving mucus membranes or skin necrosis: Yes Has patient had a PCN reaction that required hospitalization: Yes - In hospital Has patient had a PCN reaction occurring within the last 10 years: No If all of the above answers are  "NO", then may proceed with Cephalosporin use.    Shellfish Allergy Hives and Swelling    CRAB MEAT > WHELPS   Latex     UNSPECIFIED REACTION     History of Present Illness    Hector Morris is a 76 y.o. male with a hx of ascending thoracic aortic aneurysm 4.1 cm, hyperlipidemia, AAA s/p EVAR 2019, hypothyroidism, OSA, nonobstructive CAD last seen 08/03/19 by Dr. Debara Pickett.  Follows routinely with Vascular Surgery regarding previous EVAR. He had duplex 09/2020 with patent endovascular repair. He had CT chest to monitor thoracic aorta 09/2020 with stable measurement 4.1cm.   He presents today for follow up with his wife. He reports exertional dyspnea which is worse than his basline. Will get occasional chest discomfort when he initially starts moving or doing yard work. Does try to stay very active. Tells me he has been having 'esophagus trouble' and indigestion with upcoming appointment in December with Dr. Carlean Purl of gastroenterology. Reports no shortness of breath at rest. No edema, orthopnea, PND. Reports no palpitations.    EKGs/Labs/Other Studies Reviewed:   The following studies were reviewed today:  VAS EVAR Duplex 10/14/20  Abdominal Aorta: Patent endovascular repair. The largest aortic diameter  has decreased compared to prior exam. Previous diameter measurement was  4.5 cm obtained on 09/04/2019.  CT 09/2020 IMPRESSION: Unchanged size/appearance of ascending aorta, estimated 4.1 cm maximum dimension. Recommend annual imaging followup by CTA or MRA. This recommendation follows 2010 ACCF/AHA/AATS/ACR/ASA/SCA/SCAI/SIR/STS/SVM Guidelines for the Diagnosis and Management of Patients with Thoracic Aortic Disease. Circulation. 2010; 121: H476-L465. Aortic aneurysm NOS (ICD10-I71.9)   Aortic atherosclerosis and coronary artery disease. Aortic Atherosclerosis (ICD10-I70.0).  EKG:  EKG is  ordered today.  The ekg ordered today demonstrates NSR 71 bpm with new lateral TWI. Occasional  PVC  Recent Labs: 05/03/2020: ALT 19; BUN 19; Potassium 4.5; Sodium 141 08/16/2020: Hemoglobin 13.3; Platelets 245.0; TSH 4.06 09/20/2020: Creatinine, Ser 1.40  Recent Lipid Panel    Component Value Date/Time   CHOL 146 05/03/2020 1052   CHOL 129 06/28/2017 0823   TRIG 279.0 (H) 05/03/2020 1052   HDL 35.00 (L) 05/03/2020 1052   HDL 32 (L) 06/28/2017 0823   CHOLHDL 4 05/03/2020 1052   VLDL 55.8 (H) 05/03/2020 1052   LDLCALC 77 11/01/2019 1017   LDLDIRECT 71.0 05/03/2020 1052   Home Medications   Current Meds  Medication Sig   aspirin 81 MG chewable tablet Chew 81 mg by mouth daily.   capsaicin (ZOSTRIX) 0.025 % cream Apply 1 application topically 2 (two) times daily as needed (for feet).   carvedilol (COREG) 6.25 MG tablet Take 6.25 mg by mouth 2 (two) times daily with a meal.   Cholecalciferol (VITAMIN D3) 125 MCG (5000 UT) CAPS Take 5,000 Units by mouth every morning.   Cyanocobalamin (VITAMIN B-12 PO) Take by mouth daily.   levothyroxine (SYNTHROID) 25 MCG tablet Take 1 tablet (25 mcg total) by mouth daily before breakfast.   omeprazole (PRILOSEC) 20 MG capsule Take 20 mg by mouth daily.   PARoxetine (PAXIL) 40 MG tablet Take 20 mg by mouth daily.   POTASSIUM PO Take 1 tablet by mouth daily. Patient not sue of dosage   rosuvastatin (CRESTOR) 40 MG tablet Take 1 tablet (40 mg total) by mouth daily.     Review of Systems      All other systems reviewed and are otherwise negative except as noted above.  Physical Exam    VS:  BP 118/64   Pulse 71   Ht 5\' 10"  (1.778 m)   Wt 220 lb (99.8 kg)   SpO2 96%   BMI 31.57 kg/m  , BMI Body mass index is 31.57 kg/m.  Wt Readings from Last 3 Encounters:  12/13/20 220 lb (99.8 kg)  11/15/20 220 lb 11.2 oz (100.1 kg)  10/15/20 218 lb (98.9 kg)     GEN: Well nourished, overweight,  well developed, in no acute distress. HEENT: normal. Neck: Supple, no JVD, carotid bruits, or masses. Cardiac: RRR, no murmurs, rubs, or gallops. No  clubbing, cyanosis, edema.  Radials/PT 2+ and equal bilaterally.  Respiratory:  Respirations regular and unlabored, clear to auscultation bilaterally. GI: Soft, nontender, nondistended. MS: No deformity or atrophy. Skin: Warm and dry, no rash. Neuro:  Strength and sensation are intact. Psych: Normal affect.  Assessment & Plan    CAD -  GDMT includes aspirin, carvedilol, rosuvastatin. Heart healthy diet and regular cardiovascular exercise encouraged.  EKG today with new TWI to lateral leads. Known multivessel CAD by prior CTA. Given exertional dyspnea, episodes of chest tightness will plan for cardiac CTA. Heart healthy diet and regular cardiovascular exercise encouraged.    DLD - 04/2020 LDL 71. Continue Crestor 10mg  QD. Lipid lowering diet encouraged.   HTN - BP well controlled. Continue current antihypertensive regimen of Coreg 6.25mg   BID.   OSA - CPAP compliance encouraged. Reports using intermittently.  TAA - CT 09/2020 stable 4.1cm. Repeat CT in 1 year.   S/p EVAR - Follows with vascular surgery. Continue optimal Bp and lipid control.   Disposition: Follow up in 1 year(s) with Dr. Debara Pickett or APP. Will schedule sooner follow up if cardiac CTA with significant findings.   Signed, Loel Dubonnet, NP 12/13/2020, 2:58 PM [place]

## 2020-12-13 NOTE — H&P (View-Only) (Signed)
Office Visit    Patient Name: Hector Morris Date of Encounter: 12/13/2020  PCP:  Caren Macadam, MD   [place]  Cardiologist:  Pixie Casino, MD  Advanced Practice Provider:  No care team member to display Electrophysiologist:  None      Chief Complaint    Hector Morris is a 75 y.o. male with a hx of ascending thoracic aortic aneurysm 4.1 cm, hyperlipidemia, AAA s/p EVAR 2019, hypothyroidism, OSA, nonobstructive CAD presents today for follow up of CAD/TAA   Past Medical History    Past Medical History:  Diagnosis Date   Allergy    Anxiety    Arthritis    Dyspnea    with exertion   GERD (gastroesophageal reflux disease)    History of hiatal hernia    History of kidney stones    Hx of colonic polyps 03/31/2011   Hyperlipidemia    Neuromuscular disorder ([place])    hiatal hernia   Sleep apnea    cpap   Past Surgical History:  Procedure Laterality Date   ABDOMINAL AORTIC ENDOVASCULAR STENT GRAFT N/A 04/21/2017   Procedure: ABDOMINAL AORTIC ENDOVASCULAR STENT GRAFT;  Surgeon: Serafina Mitchell, MD;  Location: [facility] OR;  Service: Vascular;  Laterality: N/A;   APPENDECTOMY     COLONOSCOPY     last 05/2016   ELBOW SURGERY Left    as teenager   HERNIA REPAIR Right    inguinal   KNEE SURGERY Right    scope - cartilage removed   POLYPECTOMY  2018   TONSILLECTOMY      Allergies  Allergies  Allergen Reactions   Bee Venom Anaphylaxis and Swelling   Penicillins Anaphylaxis and Other (See Comments)    "Mushroom" popped up on hip Has patient had a PCN reaction causing immediate rash, facial/tongue/throat swelling, SOB or lightheadedness with hypotension: Yes Has patient had a PCN reaction causing severe rash involving mucus membranes or skin necrosis: Yes Has patient had a PCN reaction that required hospitalization: Yes - In hospital Has patient had a PCN reaction occurring within the last 10 years: No If all of the above answers are  "NO", then may proceed with Cephalosporin use.    Shellfish Allergy Hives and Swelling    CRAB MEAT > WHELPS   Latex     UNSPECIFIED REACTION     History of Present Illness    Hector Morris is a 75 y.o. male with a hx of ascending thoracic aortic aneurysm 4.1 cm, hyperlipidemia, AAA s/p EVAR 2019, hypothyroidism, OSA, nonobstructive CAD last seen 08/03/19 by Dr. Debara Pickett.  Follows routinely with Vascular Surgery regarding previous EVAR. He had duplex 09/2020 with patent endovascular repair. He had CT chest to monitor thoracic aorta 09/2020 with stable measurement 4.1cm.   He presents today for follow up with his wife. He reports exertional dyspnea which is worse than his basline. Will get occasional chest discomfort when he initially starts moving or doing yard work. Does try to stay very active. Tells me he has been having 'esophagus trouble' and indigestion with upcoming appointment in December with Dr. Carlean Purl of gastroenterology. Reports no shortness of breath at rest. No edema, orthopnea, PND. Reports no palpitations.    EKGs/Labs/Other Studies Reviewed:   The following studies were reviewed today:  VAS EVAR Duplex 10/14/20  Abdominal Aorta: Patent endovascular repair. The largest aortic diameter  has decreased compared to prior exam. Previous diameter measurement was  4.5 cm obtained on 09/04/2019.  CT 09/2020 IMPRESSION: Unchanged size/appearance of ascending aorta, estimated 4.1 cm maximum dimension. Recommend annual imaging followup by CTA or MRA. This recommendation follows 2010 ACCF/AHA/AATS/ACR/ASA/SCA/SCAI/SIR/STS/SVM Guidelines for the Diagnosis and Management of Patients with Thoracic Aortic Disease. Circulation. 2010; 121: B510-C585. Aortic aneurysm NOS (ICD10-I71.9)   Aortic atherosclerosis and coronary artery disease. Aortic Atherosclerosis (ICD10-I70.0).  EKG:  EKG is  ordered today.  The ekg ordered today demonstrates NSR 71 bpm with new lateral TWI. Occasional  PVC  Recent Labs: 05/03/2020: ALT 19; BUN 19; Potassium 4.5; Sodium 141 08/16/2020: Hemoglobin 13.3; Platelets 245.0; TSH 4.06 09/20/2020: Creatinine, Ser 1.40  Recent Lipid Panel    Component Value Date/Time   CHOL 146 05/03/2020 1052   CHOL 129 06/28/2017 0823   TRIG 279.0 (H) 05/03/2020 1052   HDL 35.00 (L) 05/03/2020 1052   HDL 32 (L) 06/28/2017 0823   CHOLHDL 4 05/03/2020 1052   VLDL 55.8 (H) 05/03/2020 1052   LDLCALC 77 11/01/2019 1017   LDLDIRECT 71.0 05/03/2020 1052   Home Medications   Current Meds  Medication Sig   aspirin 81 MG chewable tablet Chew 81 mg by mouth daily.   capsaicin (ZOSTRIX) 0.025 % cream Apply 1 application topically 2 (two) times daily as needed (for feet).   carvedilol (COREG) 6.25 MG tablet Take 6.25 mg by mouth 2 (two) times daily with a meal.   Cholecalciferol (VITAMIN D3) 125 MCG (5000 UT) CAPS Take 5,000 Units by mouth every morning.   Cyanocobalamin (VITAMIN B-12 PO) Take by mouth daily.   levothyroxine (SYNTHROID) 25 MCG tablet Take 1 tablet (25 mcg total) by mouth daily before breakfast.   omeprazole (PRILOSEC) 20 MG capsule Take 20 mg by mouth daily.   PARoxetine (PAXIL) 40 MG tablet Take 20 mg by mouth daily.   POTASSIUM PO Take 1 tablet by mouth daily. Patient not sue of dosage   rosuvastatin (CRESTOR) 40 MG tablet Take 1 tablet (40 mg total) by mouth daily.     Review of Systems      All other systems reviewed and are otherwise negative except as noted above.  Physical Exam    VS:  BP 118/64   Pulse 71   Ht 5\' 10"  (1.778 m)   Wt 220 lb (99.8 kg)   SpO2 96%   BMI 31.57 kg/m  , BMI Body mass index is 31.57 kg/m.  Wt Readings from Last 3 Encounters:  12/13/20 220 lb (99.8 kg)  11/15/20 220 lb 11.2 oz (100.1 kg)  10/15/20 218 lb (98.9 kg)     GEN: Well nourished, overweight,  well developed, in no acute distress. HEENT: normal. Neck: Supple, no JVD, carotid bruits, or masses. Cardiac: RRR, no murmurs, rubs, or gallops. No  clubbing, cyanosis, edema.  Radials/PT 2+ and equal bilaterally.  Respiratory:  Respirations regular and unlabored, clear to auscultation bilaterally. GI: Soft, nontender, nondistended. MS: No deformity or atrophy. Skin: Warm and dry, no rash. Neuro:  Strength and sensation are intact. Psych: Normal affect.  Assessment & Plan    CAD -  GDMT includes aspirin, carvedilol, rosuvastatin. Heart healthy diet and regular cardiovascular exercise encouraged.  EKG today with new TWI to lateral leads. Known multivessel CAD by prior CTA. Given exertional dyspnea, episodes of chest tightness will plan for cardiac CTA. Heart healthy diet and regular cardiovascular exercise encouraged.    DLD - 04/2020 LDL 71. Continue Crestor 10mg  QD. Lipid lowering diet encouraged.   HTN - BP well controlled. Continue current antihypertensive regimen of Coreg 6.25mg   BID.   OSA - CPAP compliance encouraged. Reports using intermittently.  TAA - CT 09/2020 stable 4.1cm. Repeat CT in 1 year.   S/p EVAR - Follows with vascular surgery. Continue optimal Bp and lipid control.   Disposition: Follow up in 1 year(s) with Dr. Debara Pickett or APP. Will schedule sooner follow up if cardiac CTA with significant findings.   Signed, Loel Dubonnet, NP 12/13/2020, 2:58 PM [place]

## 2020-12-13 NOTE — Patient Instructions (Addendum)
Medication Instructions:  Continue your current medications.   *If you need a refill on your cardiac medications before your next appointment, please call your pharmacy*   Lab Work: Your physician recommends that you return for lab work today for [facility]   If you have labs (blood work) drawn today and your tests are completely normal, you will receive your results only by: MyChart Message (if you have MyChart) OR A paper copy in the mail If you have any lab test that is abnormal or we need to change your treatment, we will call you to review the results.   Testing/Procedures: Your EKG today showed normal sinus rhythm which is a good result.   Follow-Up: At [facility], you and your health needs are our priority.  As part of our continuing mission to provide you with exceptional heart care, we have created designated Provider Care Teams.  These Care Teams include your primary Cardiologist (physician) and Advanced Practice Providers (APPs -  Physician Assistants and Nurse Practitioners) who all work together to provide you with the care you need, when you need it.  We recommend signing up for the patient portal called "MyChart".  Sign up information is provided on this After Visit Summary.  MyChart is used to connect with patients for Virtual Visits (Telemedicine).  Patients are able to view lab/test results, encounter notes, upcoming appointments, etc.  Non-urgent messages can be sent to your provider as well.   To learn more about what you can do with MyChart, go to [email or web address].    Your next appointment:   1 year(s)  The format for your next appointment:   In Person  Provider:   You may see Pixie Casino, MD or one of the following Advanced Practice Providers on your designated Care Team:   Almyra Deforest, PA-C Fabian Sharp, PA-C or  Roby Lofts, [place] Loel Dubonnet, NP    Other Instructions  Heart Healthy Diet Recommendations: A low-salt diet is recommended. Meats  should be grilled, baked, or boiled. Avoid fried foods. Focus on lean protein sources like fish or chicken with vegetables and fruits. The American Heart Association is a Microbiologist!    Exercise recommendations: The American Heart Association recommends 150 minutes of moderate intensity exercise weekly. Try 30 minutes of moderate intensity exercise 4-5 times per week. This could include walking, jogging, or swimming.      Your cardiac CT will be scheduled at one of the below locations:   [facility] [address] [place],  [id number] [phone]   If scheduled at [facility], please arrive at the [facility] main entrance (entrance A) of [facility] 30 minutes prior to test start time. You can use the FREE valet parking offered at the main entrance (encouraged to control the heart rate for the test) Proceed to the [facility] Radiology Department (first floor) to check-in and test prep.  Please follow these instructions carefully (unless otherwise directed):  Hold all erectile dysfunction medications at least 3 days (72 hrs) prior to test.  On the Night Before the Test: Be sure to Drink plenty of water. Do not consume any caffeinated/decaffeinated beverages or chocolate 12 hours prior to your test. Do not take any antihistamines 12 hours prior to your test.   On the Day of the Test: Drink plenty of water until 1 hour prior to the test. Do not eat any food 4 hours prior to the test. You may take your regular medications  prior to the test.  Take metoprolol (Lopressor) two hours prior to test. HOLD Furosemide/Hydrochlorothiazide morning of the test.      After the Test: Drink plenty of water. After receiving IV contrast, you may experience a mild flushed feeling. This is normal. On occasion, you may experience a mild rash up to 24 hours after the test. This is not dangerous. If this occurs, you can take Benadryl 25 mg and increase your  fluid intake. If you experience trouble breathing, this can be serious. If it is severe call 911 IMMEDIATELY. If it is mild, please call our office. If you take any of these medications: Glipizide/Metformin, Avandament, Glucavance, please do not take 48 hours after completing test unless otherwise instructed.  Please allow 2-4 weeks for scheduling of routine cardiac CTs. Some insurance companies require a pre-authorization which may delay scheduling of this test.   For non-scheduling related questions, please contact the cardiac imaging nurse navigator should you have any questions/concerns: Marchia Bond, Cardiac Imaging Nurse Navigator Gordy Clement, Cardiac Imaging Nurse Navigator [facility] Direct Office Dial: [phone]   For scheduling needs, including cancellations and rescheduling, please call [place], [phone].

## 2020-12-14 LAB — BASIC METABOLIC PANEL
BUN/Creatinine Ratio: 13 (ref 10–24)
BUN: 16 mg/dL (ref 8–27)
CO2: 25 mmol/L (ref 20–29)
Calcium: 9.8 mg/dL (ref 8.6–10.2)
Chloride: 105 mmol/L (ref 96–106)
Creatinine, Ser: 1.28 mg/dL — ABNORMAL HIGH (ref 0.76–1.27)
Glucose: 94 mg/dL (ref 70–99)
Potassium: 4.5 mmol/L (ref 3.5–5.2)
Sodium: 143 mmol/L (ref 134–144)
eGFR: 58 mL/min/{1.73_m2} — ABNORMAL LOW (ref >59)

## 2020-12-26 ENCOUNTER — Telehealth (HOSPITAL_COMMUNITY): Payer: Self-pay | Admitting: *Deleted

## 2020-12-26 NOTE — Telephone Encounter (Signed)
Reaching out to patient to offer assistance regarding upcoming cardiac imaging study; pt's wife verbalizes understanding of appt date/time, parking situation and where to check in, pre-test NPO status and medications ordered, and verified current allergies; name and call back number provided for further questions should they arise  Gordy Clement RN Navigator Cardiac Imaging [facility] [phone] office [phone] cell  Patient to hold his carvedilol and take 100mg  metoprolol tartrate two hours prior to cardiac CT scan.

## 2020-12-27 ENCOUNTER — Other Ambulatory Visit: Payer: Self-pay | Admitting: Cardiovascular Disease

## 2020-12-27 ENCOUNTER — Other Ambulatory Visit: Payer: Self-pay

## 2020-12-27 ENCOUNTER — Ambulatory Visit (HOSPITAL_COMMUNITY)
Admission: RE | Admit: 2020-12-27 | Discharge: 2020-12-27 | Disposition: A | Discharge status: Home / Self Care | Payer: Medicare Other | Source: Ambulatory Visit | Attending: Cardiovascular Disease | Admitting: Cardiovascular Disease

## 2020-12-27 ENCOUNTER — Ambulatory Visit (HOSPITAL_COMMUNITY)
Admission: RE | Admit: 2020-12-27 | Discharge: 2020-12-27 | Disposition: A | Discharge status: Home / Self Care | Payer: Medicare Other | Source: Ambulatory Visit | Attending: Family | Admitting: Family

## 2020-12-27 ENCOUNTER — Telehealth: Payer: Self-pay | Admitting: General Practice

## 2020-12-27 ENCOUNTER — Telehealth: Payer: Self-pay

## 2020-12-27 DIAGNOSIS — R931 Abnormal findings on diagnostic imaging of heart and coronary circulation: Secondary | ICD-10-CM | POA: Insufficient documentation

## 2020-12-27 DIAGNOSIS — R0609 Other forms of dyspnea: Secondary | ICD-10-CM | POA: Diagnosis not present

## 2020-12-27 DIAGNOSIS — E785 Hyperlipidemia, unspecified: Secondary | ICD-10-CM | POA: Diagnosis not present

## 2020-12-27 DIAGNOSIS — R072 Precordial pain: Secondary | ICD-10-CM | POA: Insufficient documentation

## 2020-12-27 DIAGNOSIS — I7121 Aneurysm of the ascending aorta, without rupture: Secondary | ICD-10-CM | POA: Insufficient documentation

## 2020-12-27 DIAGNOSIS — R9431 Abnormal electrocardiogram [ECG] [EKG]: Secondary | ICD-10-CM | POA: Insufficient documentation

## 2020-12-27 DIAGNOSIS — I25118 Atherosclerotic heart disease of native coronary artery with other forms of angina pectoris: Secondary | ICD-10-CM | POA: Insufficient documentation

## 2020-12-27 DIAGNOSIS — I2510 Atherosclerotic heart disease of native coronary artery without angina pectoris: Secondary | ICD-10-CM | POA: Diagnosis not present

## 2020-12-27 DIAGNOSIS — Z01812 Encounter for preprocedural laboratory examination: Secondary | ICD-10-CM

## 2020-12-27 MED ORDER — IOHEXOL 350 MG/ML SOLN
100.0000 mL | Freq: Once | INTRAVENOUS | Status: AC | PRN
  Administered 2020-12-27: 100 mL via INTRAVENOUS

## 2020-12-27 MED ORDER — NITROGLYCERIN 0.4 MG SL SUBL
SUBLINGUAL_TABLET | SUBLINGUAL | Status: AC
  Filled 2020-12-27: qty 2

## 2020-12-27 MED ORDER — NITROGLYCERIN 0.4 MG SL SUBL
0.8000 mg | SUBLINGUAL_TABLET | Freq: Once | SUBLINGUAL | Status: AC
  Administered 2020-12-27: 0.8 mg via SUBLINGUAL

## 2020-12-27 MED ORDER — METOPROLOL TARTRATE 5 MG/5ML IV SOLN
INTRAVENOUS | Status: AC
  Filled 2020-12-27: qty 10

## 2020-12-27 MED ORDER — METOPROLOL TARTRATE 5 MG/5ML IV SOLN
5.0000 mg | INTRAVENOUS | Status: DC | PRN
  Administered 2020-12-27: 5 mg via INTRAVENOUS

## 2020-12-27 NOTE — Progress Notes (Signed)
CT scan completed. Tolerated well. D/C home ambulatory with wife. Awake and alert. In no distress

## 2020-12-27 NOTE — Telephone Encounter (Signed)
NOT NEEDED

## 2020-12-27 NOTE — Telephone Encounter (Signed)
Attempted to contact patient to review his coronary CTA results.  His mailbox on his cell phone has not yet been set up.  I contacted/attempted to contact him on his home number.  I left a detailed message about calling back to review his coronary CTA results.  Dr. Johnsie Cancel has reviewed his results.  He sent results for FFR analysis.  However, FFR could not processed and he recommended proceeding to cardiac catheterization.  Patient will need call back and cardiac catheterization reviewed/informed consent, and scheduled.  Jossie Ng. Maddox Hlavaty NP-C    12/27/2020, 4:07 PM [address] Office [phone] Fax [phone]

## 2020-12-30 ENCOUNTER — Other Ambulatory Visit (HOSPITAL_BASED_OUTPATIENT_CLINIC_OR_DEPARTMENT_OTHER): Payer: Self-pay | Admitting: Family

## 2020-12-30 ENCOUNTER — Encounter: Payer: Self-pay | Admitting: *Deleted

## 2020-12-30 NOTE — Telephone Encounter (Signed)
Patient scheduled for cath 11/10 with Dr [place]  Will go for labs tomorrow  Cath instructions sent to patient via mychart and advised to call if any questions

## 2020-12-30 NOTE — Addendum Note (Signed)
Addended by: Alvina Filbert B on: 12/30/2020 04:42 PM   Modules accepted: Orders

## 2020-12-30 NOTE — Telephone Encounter (Signed)
Spoke with Hector Morris. Reviewed cardiac catheterization in detail.   Shared Decision Making/Informed Consent{  The risks [stroke (1 in 1000), death (1 in 1000), kidney failure [usually temporary] (1 in 500), bleeding (1 in 200), allergic reaction [possibly serious] (1 in 200)], benefits (diagnostic support and management of coronary artery disease) and alternatives of a cardiac catheterization were discussed in detail with Mr. Aime and he is willing to proceed.  Will route to triage team to assist with scheduling cath. He will have BMP/CBC at [facility] in [place], [place].  Loel Dubonnet, NP

## 2020-12-30 NOTE — Telephone Encounter (Signed)
Orders placed.  Loel Dubonnet, NP

## 2020-12-31 DIAGNOSIS — Z01812 Encounter for preprocedural laboratory examination: Secondary | ICD-10-CM | POA: Diagnosis not present

## 2020-12-31 DIAGNOSIS — R072 Precordial pain: Secondary | ICD-10-CM | POA: Diagnosis not present

## 2021-01-01 ENCOUNTER — Telehealth: Payer: Self-pay | Admitting: *Deleted

## 2021-01-01 LAB — BASIC METABOLIC PANEL
BUN/Creatinine Ratio: 11 (ref 10–24)
BUN: 13 mg/dL (ref 8–27)
CO2: 26 mmol/L (ref 20–29)
Calcium: 9.2 mg/dL (ref 8.6–10.2)
Chloride: 102 mmol/L (ref 96–106)
Creatinine, Ser: 1.17 mg/dL (ref 0.76–1.27)
Glucose: 137 mg/dL — ABNORMAL HIGH (ref 70–99)
Potassium: 3.6 mmol/L (ref 3.5–5.2)
Sodium: 140 mmol/L (ref 134–144)
eGFR: 64 mL/min/{1.73_m2} (ref >59)

## 2021-01-01 LAB — CBC WITH DIFFERENTIAL/PLATELET
Basophils Absolute: 0.1 10*3/uL (ref 0.0–0.2)
Basos: 1 %
EOS (ABSOLUTE): 0.2 10*3/uL (ref 0.0–0.4)
Eos: 2 %
Hematocrit: 39.3 % (ref 37.5–51.0)
Hemoglobin: 13.4 g/dL (ref 13.0–17.7)
Immature Grans (Abs): 0 10*3/uL (ref 0.0–0.1)
Immature Granulocytes: 0 %
Lymphocytes Absolute: 1.2 10*3/uL (ref 0.7–3.1)
Lymphs: 13 %
MCH: 30.7 pg (ref 26.6–33.0)
MCHC: 34.1 g/dL (ref 31.5–35.7)
MCV: 90 fL (ref 79–97)
Monocytes Absolute: 0.4 10*3/uL (ref 0.1–0.9)
Monocytes: 5 %
Neutrophils Absolute: 7.6 10*3/uL — ABNORMAL HIGH (ref 1.4–7.0)
Neutrophils: 79 %
Platelets: 278 10*3/uL (ref 150–450)
RBC: 4.37 x10E6/uL (ref 4.14–5.80)
RDW: 12.3 % (ref 11.6–15.4)
WBC: 9.6 10*3/uL (ref 3.4–10.8)

## 2021-01-01 NOTE — Telephone Encounter (Signed)
Cardiac catheterization scheduled at [facility] for: Thursday January 02, 2021 1:30 PM [facility] Main Entrance A [facility]) at: 11:30 AM   No solid food after midnight prior to cath, clear liquids until 5 AM day of procedure.   Usual morning medications can be taken pre-cath with sips of water including aspirin 81 mg.    Confirmed patient has responsible adult to drive home post procedure and be with patient first 24 hours after arriving home.  [facility] does allow one visitor to accompany you and wait in the hospital waiting room while you are there for your procedure. You and your visitor will be asked to wear a mask once you enter the hospital.   Patient reports does not currently have any new symptoms concerning for COVID-19 and no household members with COVID-19 like illness.          Reviewed procedure/mask/visitor instructions with patient.

## 2021-01-02 ENCOUNTER — Ambulatory Visit (HOSPITAL_COMMUNITY)
Admission: RE | Admit: 2021-01-02 | Discharge: 2021-01-02 | Disposition: A | Discharge status: Home / Self Care | Payer: Medicare Other | Attending: Cardiology | Admitting: Cardiology

## 2021-01-02 ENCOUNTER — Encounter (HOSPITAL_COMMUNITY): Payer: Self-pay | Admitting: Cardiology

## 2021-01-02 ENCOUNTER — Other Ambulatory Visit: Payer: Self-pay

## 2021-01-02 ENCOUNTER — Ambulatory Visit (HOSPITAL_COMMUNITY)
Admission: RE | Disposition: A | Discharge status: Home / Self Care | Payer: Self-pay | Source: Home / Self Care | Attending: Cardiology

## 2021-01-02 DIAGNOSIS — E039 Hypothyroidism, unspecified: Secondary | ICD-10-CM | POA: Insufficient documentation

## 2021-01-02 DIAGNOSIS — G4733 Obstructive sleep apnea (adult) (pediatric): Secondary | ICD-10-CM | POA: Diagnosis not present

## 2021-01-02 DIAGNOSIS — I2510 Atherosclerotic heart disease of native coronary artery without angina pectoris: Secondary | ICD-10-CM | POA: Diagnosis present

## 2021-01-02 DIAGNOSIS — I25119 Atherosclerotic heart disease of native coronary artery with unspecified angina pectoris: Secondary | ICD-10-CM

## 2021-01-02 DIAGNOSIS — E785 Hyperlipidemia, unspecified: Secondary | ICD-10-CM | POA: Diagnosis present

## 2021-01-02 DIAGNOSIS — I7121 Aneurysm of the ascending aorta, without rupture: Secondary | ICD-10-CM | POA: Diagnosis not present

## 2021-01-02 DIAGNOSIS — I209 Angina pectoris, unspecified: Secondary | ICD-10-CM | POA: Diagnosis present

## 2021-01-02 DIAGNOSIS — I10 Essential (primary) hypertension: Secondary | ICD-10-CM | POA: Diagnosis not present

## 2021-01-02 DIAGNOSIS — I712 Thoracic aortic aneurysm, without rupture, unspecified: Secondary | ICD-10-CM | POA: Diagnosis present

## 2021-01-02 HISTORY — PX: INTRAVASCULAR PRESSURE WIRE/FFR STUDY: CATH118243

## 2021-01-02 HISTORY — PX: LEFT HEART CATH AND CORONARY ANGIOGRAPHY: CATH118249

## 2021-01-02 LAB — POCT ACTIVATED CLOTTING TIME: Activated Clotting Time: 405 seconds

## 2021-01-02 SURGERY — LEFT HEART CATH AND CORONARY ANGIOGRAPHY
Anesthesia: LOCAL

## 2021-01-02 MED ORDER — NITROGLYCERIN 1 MG/10 ML FOR IR/CATH LAB
INTRA_ARTERIAL | Status: DC | PRN
  Administered 2021-01-02: 200 ug via INTRACORONARY

## 2021-01-02 MED ORDER — HEPARIN SODIUM (PORCINE) 1000 UNIT/ML IJ SOLN
INTRAMUSCULAR | Status: DC | PRN
  Administered 2021-01-02: 4000 [IU] via INTRAVENOUS
  Administered 2021-01-02: 5000 [IU] via INTRAVENOUS

## 2021-01-02 MED ORDER — SODIUM CHLORIDE 0.9 % IV SOLN: 250.0000 mL | INTRAVENOUS | Status: DC | PRN

## 2021-01-02 MED ORDER — SODIUM CHLORIDE 0.9% FLUSH: 3.0000 mL | INTRAVENOUS | Status: DC | PRN

## 2021-01-02 MED ORDER — MIDAZOLAM HCL 2 MG/2ML IJ SOLN
INTRAMUSCULAR | Status: DC | PRN
  Administered 2021-01-02: 1 mg via INTRAVENOUS

## 2021-01-02 MED ORDER — SODIUM CHLORIDE 0.9 % WEIGHT BASED INFUSION
3.0000 mL/kg/h | INTRAVENOUS | Status: AC
  Administered 2021-01-02: 3 mL/kg/h via INTRAVENOUS

## 2021-01-02 MED ORDER — SODIUM CHLORIDE 0.9% FLUSH: 3.0000 mL | Freq: Two times a day (BID) | INTRAVENOUS | Status: DC

## 2021-01-02 MED ORDER — ONDANSETRON HCL 4 MG/2ML IJ SOLN: 4.0000 mg | Freq: Four times a day (QID) | INTRAMUSCULAR | Status: DC | PRN

## 2021-01-02 MED ORDER — VERAPAMIL HCL 2.5 MG/ML IV SOLN
INTRAVENOUS | Status: AC
  Filled 2021-01-02: qty 2

## 2021-01-02 MED ORDER — ASPIRIN 81 MG PO CHEW: 81.0000 mg | CHEWABLE_TABLET | ORAL | Status: DC

## 2021-01-02 MED ORDER — HEPARIN (PORCINE) IN NACL 1000-0.9 UT/500ML-% IV SOLN
INTRAVENOUS | Status: AC
  Filled 2021-01-02: qty 1000

## 2021-01-02 MED ORDER — IOHEXOL 350 MG/ML SOLN
INTRAVENOUS | Status: DC | PRN
  Administered 2021-01-02: 110 mL

## 2021-01-02 MED ORDER — ACETAMINOPHEN 325 MG PO TABS: 650.0000 mg | ORAL_TABLET | ORAL | Status: DC | PRN

## 2021-01-02 MED ORDER — FENTANYL CITRATE (PF) 100 MCG/2ML IJ SOLN
INTRAMUSCULAR | Status: DC | PRN
  Administered 2021-01-02: 25 ug via INTRAVENOUS

## 2021-01-02 MED ORDER — VERAPAMIL HCL 2.5 MG/ML IV SOLN
INTRAVENOUS | Status: DC | PRN
  Administered 2021-01-02: 10 mL via INTRA_ARTERIAL

## 2021-01-02 MED ORDER — LIDOCAINE HCL (PF) 1 % IJ SOLN
INTRAMUSCULAR | Status: DC | PRN
  Administered 2021-01-02: 2 mL

## 2021-01-02 MED ORDER — NITROGLYCERIN 1 MG/10 ML FOR IR/CATH LAB
INTRA_ARTERIAL | Status: AC
  Filled 2021-01-02: qty 10

## 2021-01-02 MED ORDER — LIDOCAINE HCL (PF) 1 % IJ SOLN
INTRAMUSCULAR | Status: AC
  Filled 2021-01-02: qty 30

## 2021-01-02 MED ORDER — FENTANYL CITRATE (PF) 100 MCG/2ML IJ SOLN
INTRAMUSCULAR | Status: AC
  Filled 2021-01-02: qty 2

## 2021-01-02 MED ORDER — HEPARIN SODIUM (PORCINE) 1000 UNIT/ML IJ SOLN
INTRAMUSCULAR | Status: AC
  Filled 2021-01-02: qty 1

## 2021-01-02 MED ORDER — MIDAZOLAM HCL 2 MG/2ML IJ SOLN
INTRAMUSCULAR | Status: AC
  Filled 2021-01-02: qty 2

## 2021-01-02 MED ORDER — NITROGLYCERIN 0.4 MG SL SUBL: 0.4000 mg | SUBLINGUAL_TABLET | SUBLINGUAL | 3 refills | Status: DC | PRN

## 2021-01-02 MED ORDER — SODIUM CHLORIDE 0.9 % WEIGHT BASED INFUSION
1.0000 mL/kg/h | INTRAVENOUS | Status: DC
  Administered 2021-01-02: 1 mL/kg/h via INTRAVENOUS

## 2021-01-02 MED ORDER — SODIUM CHLORIDE 0.9 % WEIGHT BASED INFUSION: 1.0000 mL/kg/h | INTRAVENOUS | Status: AC

## 2021-01-02 SURGICAL SUPPLY — 12 items
CATH 5FR JL3.5 JR4 ANG PIG MP (CATHETERS) ×2 IMPLANT
CATH VISTA GUIDE 6FR XBLAD3.5 (CATHETERS) ×2 IMPLANT
DEVICE RAD COMP TR BAND LRG (VASCULAR PRODUCTS) ×2 IMPLANT
GLIDESHEATH SLEND SS 6F .021 (SHEATH) ×2 IMPLANT
GUIDEWIRE INQWIRE 1.5J.035X260 (WIRE) ×1 IMPLANT
GUIDEWIRE PRESSURE X 175 (WIRE) ×2 IMPLANT
INQWIRE 1.5J .035X260CM (WIRE) ×2
KIT ESSENTIALS PG (KITS) ×2 IMPLANT
KIT HEART LEFT (KITS) ×2 IMPLANT
PACK CARDIAC CATHETERIZATION (CUSTOM PROCEDURE TRAY) ×2 IMPLANT
TRANSDUCER W/STOPCOCK (MISCELLANEOUS) ×2 IMPLANT
TUBING CIL FLEX 10 FLL-RA (TUBING) ×2 IMPLANT

## 2021-01-02 NOTE — Interval H&P Note (Signed)
History and Physical Interval Note:  01/02/2021 1:41 PM  Hector Morris  has presented today for surgery, with the diagnosis of chest pain - abnormal ct.  The various methods of treatment have been discussed with the patient and family. After consideration of risks, benefits and other options for treatment, the patient has consented to  Procedure(s): LEFT HEART CATH AND CORONARY ANGIOGRAPHY (N/A) as a surgical intervention.  The patient's history has been reviewed, patient examined, no change in status, stable for surgery.  I have reviewed the patient's chart and labs.  Questions were answered to the patient's satisfaction.   Cath Lab Visit (complete for each Cath Lab visit)  Clinical Evaluation Leading to the Procedure:   ACS: No.  Non-ACS:    Anginal Classification: CCS II  Anti-ischemic medical therapy: Minimal Therapy (1 class of medications)  Non-Invasive Test Results: High-risk stress test findings: cardiac mortality >3%/year  Prior CABG: No previous CABG        Hector Morris [facility] 01/02/2021 1:41 PM

## 2021-01-03 ENCOUNTER — Encounter (HOSPITAL_COMMUNITY): Payer: Self-pay | Admitting: Cardiology

## 2021-01-03 MED FILL — Heparin Sod (Porcine)-NaCl IV Soln 1000 Unit/500ML-0.9%: INTRAVENOUS | Qty: 1000 | Status: AC

## 2021-01-08 NOTE — Progress Notes (Signed)
[address]       [place] [address]             [phone]        Hector Morris  Medical Record #[id number] Date of Birth: 07-18-1945  Referring: Martinique, Peter M, MD Primary Care: Caren Macadam, MD Primary Cardiologist:Kenneth Wells Guiles, MD  Chief Complaint:    Chief Complaint  Patient presents with   Coronary Artery Disease    Initial surgical consult, Cath 11/10    History of Present Illness:     75 year old male referred for artery bypass grafting.  Over the past year he has had some exertional dyspnea and anginal symptoms.  He recently underwent a left heart cath which showed severe three-vessel coronary artery disease.  He also has a history of an ascending aortic aneurysm, and underwent EVAR placement for an abdominal aneurysm in 2019.   Past Medical and Surgical History: Previous Chest Surgery: No Previous Chest Radiation: No Diabetes Mellitus: No.  HbA1C 5.8 Creatinine: 1.17  Past Medical History:  Diagnosis Date   Allergy    Anxiety    Arthritis    Dyspnea    with exertion   GERD (gastroesophageal reflux disease)    History of hiatal hernia    History of kidney stones    Hx of colonic polyps 03/31/2011   Hyperlipidemia    Neuromuscular disorder ([place])    hiatal hernia   Sleep apnea    cpap    Past Surgical History:  Procedure Laterality Date   ABDOMINAL AORTIC ENDOVASCULAR STENT GRAFT N/A 04/21/2017   Procedure: ABDOMINAL AORTIC ENDOVASCULAR STENT GRAFT;  Surgeon: Serafina Mitchell, MD;  Location: [facility] OR;  Service: Vascular;  Laterality: N/A;   APPENDECTOMY     COLONOSCOPY     last 05/2016   ELBOW SURGERY Left    as teenager   HERNIA REPAIR Right    inguinal   INTRAVASCULAR PRESSURE WIRE/FFR STUDY N/A 01/02/2021   Procedure: INTRAVASCULAR PRESSURE WIRE/FFR STUDY;  Surgeon: Martinique, Peter M, MD;  Location: [facility];  Service: Cardiovascular;  Laterality: N/A;   KNEE SURGERY Right    scope - cartilage  removed   LEFT HEART CATH AND CORONARY ANGIOGRAPHY N/A 01/02/2021   Procedure: LEFT HEART CATH AND CORONARY ANGIOGRAPHY;  Surgeon: Martinique, Peter M, MD;  Location: [facility];  Service: Cardiovascular;  Laterality: N/A;   POLYPECTOMY  2018   TONSILLECTOMY      Social History: Support: Presents today with his wife  Social History   Tobacco Use  Smoking Status Former   Packs/day: 1.50   Years: 39.00   Pack years: 58.50   Types: Cigarettes   Quit date: 02/23/2006   Years since quitting: 14.8  Smokeless Tobacco Former   Types: Chew    Social History   Substance and Sexual Activity  Alcohol Use Yes   Comment: occasional     Allergies  Allergen Reactions   Bee Venom Anaphylaxis and Swelling   Penicillins Anaphylaxis and Other (See Comments)    "Mushroom" popped up on hip Has patient had a PCN reaction causing immediate rash, facial/tongue/throat swelling, SOB or lightheadedness with hypotension: Yes Has patient had a PCN reaction causing severe rash involving mucus membranes or skin necrosis: Yes Has patient had a PCN reaction that required hospitalization: Yes - In hospital Has patient had a PCN reaction occurring within the last 10 years: No If all of the above  answers are "NO", then may proceed with Cephalosporin use.    Shellfish Allergy Hives and Swelling    CRAB MEAT > WHELPS   Latex     UNSPECIFIED REACTION       Current Outpatient Medications  Medication Sig Dispense Refill   aspirin 81 MG EC tablet Take 81 mg by mouth at bedtime.     capsaicin (ZOSTRIX) 0.025 % cream Apply 1 application topically 2 (two) times daily as needed (for feet).     carvedilol (COREG) 12.5 MG tablet Take 6.25 mg by mouth 2 (two) times daily with a meal.     Cholecalciferol (VITAMIN D3) 125 MCG (5000 UT) CAPS Take 5,000 Units by mouth every morning.     Cyanocobalamin (VITAMIN B-12 PO) Take by mouth daily.     levothyroxine (SYNTHROID) 25 MCG tablet Take 1 tablet (25 mcg total)  by mouth daily before breakfast. (Patient taking differently: Take 12.5 mcg by mouth daily before breakfast.) 90 tablet 1   nitroGLYCERIN (NITROSTAT) 0.4 MG SL tablet Place 1 tablet (0.4 mg total) under the tongue every 5 (five) minutes as needed for chest pain. 100 tablet 3   omeprazole (PRILOSEC) 20 MG capsule Take 40 mg by mouth daily.     PARoxetine (PAXIL) 20 MG tablet Take 10 mg by mouth in the morning and at bedtime.     POTASSIUM PO Take 1 tablet by mouth daily. Patient not sue of dosage     rosuvastatin (CRESTOR) 40 MG tablet Take 1 tablet (40 mg total) by mouth daily. 90 tablet 3   No current facility-administered medications for this visit.    (Not in a hospital admission)   Family History  Problem Relation Age of Onset   Heart murmur Mother    Heart attack Father    Stroke Sister    Obesity Brother    Colon cancer Neg Hx    Colon polyps Neg Hx      Review of Systems:   Review of Systems  Constitutional: Negative.   Respiratory:  Positive for shortness of breath.   Cardiovascular:  Positive for chest pain.  Psychiatric/Behavioral:  The patient is nervous/anxious.      Physical Exam: BP 120/69 (BP Location: Left Arm, Patient Position: Sitting)   Pulse 68   Resp 20   Ht 5\' 10"  (1.778 m)   Wt 220 lb (99.8 kg)   SpO2 96% Comment: RA  BMI 31.57 kg/m  Physical Exam Constitutional:      General: He is not in acute distress.    Appearance: Normal appearance. He is not ill-appearing.  HENT:     Head: Normocephalic and atraumatic.  Eyes:     Extraocular Movements: Extraocular movements intact.  Cardiovascular:     Rate and Rhythm: Normal rate.     Heart sounds: No murmur heard. Pulmonary:     Effort: Pulmonary effort is normal. No respiratory distress.     Breath sounds: Normal breath sounds.  Abdominal:     General: There is no distension.  Musculoskeletal:        General: Normal range of motion.     Cervical back: Normal range of motion.  Skin:     General: Skin is warm and dry.  Neurological:     General: No focal deficit present.     Mental Status: He is alert and oriented to person, place, and time.      Diagnostic Studies & Laboratory data:    Left Heart Catherization: Left Main  Vessel was injected. Vessel is normal in caliber. There is mild diffuse disease throughout the vessel. The vessel is severely calcified.  Left Anterior Descending  Prox LAD lesion is 70% stenosed with 0% stenosed side branch in 1st Sept. The lesion is severely calcified. Pressure wire/FFR was performed on the lesion. RFR 0.86  Left Circumflex  Prox Cx to Mid Cx lesion is 90% stenosed. The lesion is severely calcified.  First Obtuse Marginal Branch  1st Mrg lesion is 90% stenosed.  Right Coronary Artery  Vessel was injected. Vessel is large. The vessel is severely calcified.  Ost RCA to Prox RCA lesion is 99% stenosed.  Prox RCA to Mid RCA lesion is 100% stenosed. The lesion is chronically occluded. The lesion is severely calcified.  Right Posterior Descending Artery  Collaterals  RPDA filled by collaterals from Dist LAD.   Intervention  Echo: Pending EKG: Sinus I have independently reviewed the above radiologic studies and discussed with the patient   Recent Lab Findings: Lab Results  Component Value Date   WBC 9.6 12/31/2020   HGB 13.4 12/31/2020   HCT 39.3 12/31/2020   PLT 278 12/31/2020   GLUCOSE 137 (H) 12/31/2020   CHOL 146 05/03/2020   TRIG 279.0 (H) 05/03/2020   HDL 35.00 (L) 05/03/2020   LDLDIRECT 71.0 05/03/2020   LDLCALC 77 11/01/2019   ALT 19 05/03/2020   AST 20 05/03/2020   NA 140 12/31/2020   K 3.6 12/31/2020   CL 102 12/31/2020   CREATININE 1.17 12/31/2020   BUN 13 12/31/2020   CO2 26 12/31/2020   TSH 4.06 08/16/2020   INR 1.24 04/21/2017   HGBA1C 5.8 05/03/2020      Assessment / Plan:   75 year old male presents with severe three-vessel coronary artery disease.  I personally reviewed his left heart cath.   He has good target on his LAD as well as obtuse marginal.  The RCA is completely occluded but the PDA fills from left to right collaterals.  Good candidate for CABG x3.  The patient does not have an echocardiogram this will need to be done prior to his clinic appointment.  His EKG shows a normal sinus rhythm.  His most recent CT scan in November this year shows an ascending aortic aneurysm of 3.9 cm.  Echocardiogram has been ordered for today.  I will review this.  He is tentatively scheduled for January 20, 2021.     I  spent 40 minutes counseling the patient face to face.   Lajuana Matte 01/10/2021 4:07 PM

## 2021-01-08 NOTE — H&P (View-Only) (Signed)
[address]       ,[place] [address]             [phone]        Hector Morris [place] Medical Record #[id number] Date of Birth: February 14, 1946  Referring: Hector Morris Primary Care: Hector Morris Primary Cardiologist:Hector Morris  Chief Complaint:    Chief Complaint  Patient presents with   Coronary Artery Disease    Initial surgical consult, Cath 11/10    History of Present Illness:     75 year old male referred for artery bypass grafting.  Over the past year he has had some exertional dyspnea and anginal symptoms.  He recently underwent a left heart cath which showed severe three-vessel coronary artery disease.  He also has a history of an ascending aortic aneurysm, and underwent EVAR placement for an abdominal aneurysm in 2019.   Past Medical and Surgical History: Previous Chest Surgery: No Previous Chest Radiation: No Diabetes Mellitus: No.  HbA1C 5.8 Creatinine: 1.17  Past Medical History:  Diagnosis Date   Allergy    Anxiety    Arthritis    Dyspnea    with exertion   GERD (gastroesophageal reflux disease)    History of hiatal hernia    History of kidney stones    Hx of colonic polyps 03/31/2011   Hyperlipidemia    Neuromuscular disorder ([place])    hiatal hernia   Sleep apnea    cpap    Past Surgical History:  Procedure Laterality Date   ABDOMINAL AORTIC ENDOVASCULAR STENT GRAFT N/A 04/21/2017   Procedure: ABDOMINAL AORTIC ENDOVASCULAR STENT GRAFT;  Surgeon: Serafina Mitchell, Morris;  Location: [facility];  Service: Vascular;  Laterality: N/A;   APPENDECTOMY     COLONOSCOPY     last 05/2016   ELBOW SURGERY Left    as teenager   HERNIA REPAIR Right    inguinal   INTRAVASCULAR PRESSURE WIRE/FFR STUDY N/A 01/02/2021   Procedure: INTRAVASCULAR PRESSURE WIRE/FFR STUDY;  Surgeon: Hector Morris;  Location: [facility];  Service: Cardiovascular;  Laterality: N/A;   KNEE SURGERY Right    scope - cartilage  removed   LEFT HEART CATH AND CORONARY ANGIOGRAPHY N/A 01/02/2021   Procedure: LEFT HEART CATH AND CORONARY ANGIOGRAPHY;  Surgeon: Hector Morris;  Location: [facility];  Service: Cardiovascular;  Laterality: N/A;   POLYPECTOMY  2018   TONSILLECTOMY      Social History: Support: Presents today with his wife  Social History   Tobacco Use  Smoking Status Former   Packs/day: 1.50   Years: 39.00   Pack years: 58.50   Types: Cigarettes   Quit date: 02/23/2006   Years since quitting: 14.8  Smokeless Tobacco Former   Types: Chew    Social History   Substance and Sexual Activity  Alcohol Use Yes   Comment: occasional     Allergies  Allergen Reactions   Bee Venom Anaphylaxis and Swelling   Penicillins Anaphylaxis and Other (See Comments)    "Mushroom" popped up on hip Has patient had a PCN reaction causing immediate rash, facial/tongue/throat swelling, SOB or lightheadedness with hypotension: Yes Has patient had a PCN reaction causing severe rash involving mucus membranes or skin necrosis: Yes Has patient had a PCN reaction that required hospitalization: Yes - In hospital Has patient had a PCN reaction occurring within the last 10 years: No If all of the above  answers are "NO", then may proceed with Cephalosporin use.    Shellfish Allergy Hives and Swelling    CRAB MEAT > WHELPS   Latex     UNSPECIFIED REACTION       Current Outpatient Medications  Medication Sig Dispense Refill   aspirin 81 MG EC tablet Take 81 mg by mouth at bedtime.     capsaicin (ZOSTRIX) 0.025 % cream Apply 1 application topically 2 (two) times daily as needed (for feet).     carvedilol (COREG) 12.5 MG tablet Take 6.25 mg by mouth 2 (two) times daily with a meal.     Cholecalciferol (VITAMIN D3) 125 MCG (5000 UT) CAPS Take 5,000 Units by mouth every morning.     Cyanocobalamin (VITAMIN B-12 PO) Take by mouth daily.     levothyroxine (SYNTHROID) 25 MCG tablet Take 1 tablet (25 mcg total)  by mouth daily before breakfast. (Patient taking differently: Take 12.5 mcg by mouth daily before breakfast.) 90 tablet 1   nitroGLYCERIN (NITROSTAT) 0.4 MG SL tablet Place 1 tablet (0.4 mg total) under the tongue every 5 (five) minutes as needed for chest pain. 100 tablet 3   omeprazole (PRILOSEC) 20 MG capsule Take 40 mg by mouth daily.     PARoxetine (PAXIL) 20 MG tablet Take 10 mg by mouth in the morning and at bedtime.     POTASSIUM PO Take 1 tablet by mouth daily. Patient not sue of dosage     rosuvastatin (CRESTOR) 40 MG tablet Take 1 tablet (40 mg total) by mouth daily. 90 tablet 3   No current facility-administered medications for this visit.    (Not in a hospital admission)   Family History  Problem Relation Age of Onset   Heart murmur Mother    Heart attack Father    Stroke Sister    Obesity Brother    Colon cancer Neg Hx    Colon polyps Neg Hx      Review of Systems:   Review of Systems  Constitutional: Negative.   Respiratory:  Positive for shortness of breath.   Cardiovascular:  Positive for chest pain.  Psychiatric/Behavioral:  The patient is nervous/anxious.      Physical Exam: BP 120/69 (BP Location: Left Arm, Patient Position: Sitting)   Pulse 68   Resp 20   Ht 5\' 10"  (1.778 m)   Wt 220 lb (99.8 kg)   SpO2 96% Comment: RA  BMI 31.57 kg/m  Physical Exam Constitutional:      General: He is not in acute distress.    Appearance: Normal appearance. He is not ill-appearing.  HENT:     Head: Normocephalic and atraumatic.  Eyes:     Extraocular Movements: Extraocular movements intact.  Cardiovascular:     Rate and Rhythm: Normal rate.     Heart sounds: No murmur heard. Pulmonary:     Effort: Pulmonary effort is normal. No respiratory distress.     Breath sounds: Normal breath sounds.  Abdominal:     General: There is no distension.  Musculoskeletal:        General: Normal range of motion.     Cervical back: Normal range of motion.  Skin:     General: Skin is warm and dry.  Neurological:     General: No focal deficit present.     Mental Status: He is alert and oriented to person, place, and time.      Diagnostic Studies & Laboratory data:    Left Heart Catherization: Left Main  Vessel was injected. Vessel is normal in caliber. There is mild diffuse disease throughout the vessel. The vessel is severely calcified.  Left Anterior Descending  Prox LAD lesion is 70% stenosed with 0% stenosed side branch in 1st Sept. The lesion is severely calcified. Pressure wire/FFR was performed on the lesion. RFR 0.86  Left Circumflex  Prox Cx to Mid Cx lesion is 90% stenosed. The lesion is severely calcified.  First Obtuse Marginal Branch  1st Mrg lesion is 90% stenosed.  Right Coronary Artery  Vessel was injected. Vessel is large. The vessel is severely calcified.  Ost RCA to Prox RCA lesion is 99% stenosed.  Prox RCA to Mid RCA lesion is 100% stenosed. The lesion is chronically occluded. The lesion is severely calcified.  Right Posterior Descending Artery  Collaterals  RPDA filled by collaterals from Dist LAD.   Intervention  Echo: Pending EKG: Sinus I have independently reviewed the above radiologic studies and discussed with the patient   Recent Lab Findings: Lab Results  Component Value Date   WBC 9.6 12/31/2020   HGB 13.4 12/31/2020   HCT 39.3 12/31/2020   PLT 278 12/31/2020   GLUCOSE 137 (H) 12/31/2020   CHOL 146 05/03/2020   TRIG 279.0 (H) 05/03/2020   HDL 35.00 (L) 05/03/2020   LDLDIRECT 71.0 05/03/2020   LDLCALC 77 11/01/2019   ALT 19 05/03/2020   AST 20 05/03/2020   NA 140 12/31/2020   K 3.6 12/31/2020   CL 102 12/31/2020   CREATININE 1.17 12/31/2020   BUN 13 12/31/2020   CO2 26 12/31/2020   TSH 4.06 08/16/2020   INR 1.24 04/21/2017   HGBA1C 5.8 05/03/2020      Assessment / Plan:   75 year old male presents with severe three-vessel coronary artery disease.  I personally reviewed his left heart cath.   He has good target on his LAD as well as obtuse marginal.  The RCA is completely occluded but the PDA fills from left to right collaterals.  Good candidate for CABG x3.  The patient does not have an echocardiogram this will need to be done prior to his clinic appointment.  His EKG shows a normal sinus rhythm.  His most recent CT scan in November this year shows an ascending aortic aneurysm of 3.9 cm.  Echocardiogram has been ordered for today.  I will review this.  He is tentatively scheduled for January 20, 2021.     I  spent 40 minutes counseling the patient face to face.   Lajuana Matte 01/10/2021 4:07 PM

## 2021-01-10 ENCOUNTER — Other Ambulatory Visit: Payer: Self-pay

## 2021-01-10 ENCOUNTER — Institutional Professional Consult (permissible substitution) (INDEPENDENT_AMBULATORY_CARE_PROVIDER_SITE_OTHER): Payer: Medicare Other | Admitting: Thoracic Surgery (Cardiothoracic Vascular Surgery)

## 2021-01-10 ENCOUNTER — Other Ambulatory Visit: Payer: Self-pay | Admitting: *Deleted

## 2021-01-10 ENCOUNTER — Ambulatory Visit (HOSPITAL_COMMUNITY)
Admission: RE | Admit: 2021-01-10 | Discharge: 2021-01-10 | Disposition: A | Discharge status: Home / Self Care | Payer: Medicare Other | Source: Ambulatory Visit | Attending: Thoracic Surgery (Cardiothoracic Vascular Surgery) | Admitting: Thoracic Surgery (Cardiothoracic Vascular Surgery)

## 2021-01-10 ENCOUNTER — Other Ambulatory Visit: Payer: Self-pay | Admitting: Thoracic Surgery (Cardiothoracic Vascular Surgery)

## 2021-01-10 ENCOUNTER — Encounter: Payer: Self-pay | Admitting: *Deleted

## 2021-01-10 VITALS — BP 120/69 | HR 68 | Resp 20 | Ht 70.0 in | Wt 220.0 lb

## 2021-01-10 DIAGNOSIS — I25119 Atherosclerotic heart disease of native coronary artery with unspecified angina pectoris: Secondary | ICD-10-CM

## 2021-01-10 DIAGNOSIS — I200 Unstable angina: Secondary | ICD-10-CM

## 2021-01-10 DIAGNOSIS — I340 Nonrheumatic mitral (valve) insufficiency: Secondary | ICD-10-CM | POA: Diagnosis not present

## 2021-01-10 DIAGNOSIS — R739 Hyperglycemia, unspecified: Secondary | ICD-10-CM

## 2021-01-10 DIAGNOSIS — Z5181 Encounter for therapeutic drug level monitoring: Secondary | ICD-10-CM

## 2021-01-10 LAB — ECHOCARDIOGRAM COMPLETE
Area-P 1/2: 4.39 cm2
Calc EF: 40.5 %
Height: 70 in
MV M vel: 5.12 m/s
MV Peak grad: 104.9 mmHg
Radius: 0.37 cm
S' Lateral: 3.9 cm
Single Plane A2C EF: 34.3 %
Single Plane A4C EF: 52.2 %
Weight: 3520 oz

## 2021-01-10 NOTE — Progress Notes (Signed)
  Echocardiogram 2D Echocardiogram has been performed.  Hector Morris 01/10/2021, 3:50 PM

## 2021-01-14 NOTE — Pre-Procedure Instructions (Signed)
Surgical Instructions    Your procedure is scheduled on Monday 01/20/21.   Report to [facility] Main Entrance "A" at 09:15 A.M., then check in with the Admitting office.  Call this number if you have problems the morning of surgery:  [phone]   If you have any questions prior to your surgery date call [phone]: Open Monday-Friday 8am-4pm    Remember:  Do not eat or drink after midnight the night before your surgery     Take these medicines the morning of surgery with A SIP OF WATER   carvedilol (COREG)   levothyroxine (SYNTHROID)   omeprazole (PRILOSEC)  PARoxetine (PAXIL)   rosuvastatin (CRESTOR)   nitroGLYCERIN (NITROSTAT)- If needed   As of today, STOP taking any Aspirin (unless otherwise instructed by your surgeon) Aleve, Naproxen, Ibuprofen, Motrin, Advil, Goody's, BC's, all herbal medications, fish oil, and all vitamins.     After your COVID test   You are not required to quarantine however you are required to wear a well-fitting mask when you are out and around people not in your household.  If your mask becomes wet or soiled, replace with a new one.  Wash your hands often with soap and water for 20 seconds or clean your hands with an alcohol-based hand sanitizer that contains at least 60% alcohol.  Do not share personal items.  Notify your provider: if you are in close contact with someone who has COVID  or if you develop a fever of 100.4 or greater, sneezing, cough, sore throat, shortness of breath or body aches.             Do not wear jewelry or makeup Do not wear lotions, powders, perfumes/colognes, or deodorant. Do not shave 48 hours prior to surgery.  Men may shave face and neck. Do not bring valuables to the hospital. DO Not wear nail polish, gel polish, artificial nails, or any other type of covering on natural nails including finger and toenails. If patients have artificial nails, gel coating, etc. that need to be removed by a nail salon,  please have this removed prior to surgery or surgery may need to be canceled/delayed if the surgeon/ anesthesia feels like the patient is unable to be adequately monitored.             [facility] is not responsible for any belongings or valuables.  Do NOT Smoke (Tobacco/Vaping)  24 hours prior to your procedure  If you use a CPAP at night, you may bring your mask for your overnight stay.   Contacts, glasses, hearing aids, dentures or partials may not be worn into surgery, please bring cases for these belongings   For patients admitted to the hospital, discharge time will be determined by your treatment team.   Patients discharged the day of surgery will not be allowed to drive home, and someone needs to stay with them for 24 hours.  NO VISITORS WILL BE ALLOWED IN PRE-OP WHERE PATIENTS ARE PREPPED FOR SURGERY.  ONLY 1 SUPPORT PERSON MAY BE PRESENT IN THE WAITING ROOM WHILE YOU ARE IN SURGERY.  IF YOU ARE TO BE ADMITTED, ONCE YOU ARE IN YOUR ROOM YOU WILL BE ALLOWED TWO (2) VISITORS. 1 (ONE) VISITOR MAY STAY OVERNIGHT BUT MUST ARRIVE TO THE ROOM BY 8pm.  Minor children may have two parents present. Special consideration for safety and communication needs will be reviewed on a case by case basis.  Special instructions:    Oral Hygiene is also important to reduce  your risk of infection.  Remember - BRUSH YOUR TEETH THE MORNING OF SURGERY WITH YOUR REGULAR TOOTHPASTE   [place]- Preparing For Surgery  Before surgery, you can play an important role. Because skin is not sterile, your skin needs to be as free of germs as possible. You can reduce the number of germs on your skin by washing with CHG (chlorahexidine gluconate) Soap before surgery.  CHG is an antiseptic cleaner which kills germs and bonds with the skin to continue killing germs even after washing.     Please do not use if you have an allergy to CHG or antibacterial soaps. If your skin becomes reddened/irritated stop using the CHG.   Do not shave (including legs and underarms) for at least 48 hours prior to first CHG shower. It is OK to shave your face.  Please follow these instructions carefully.     Shower the NIGHT BEFORE SURGERY and the MORNING OF SURGERY with CHG Soap.   If you chose to wash your hair, wash your hair first as usual with your normal shampoo. After you shampoo, rinse your hair and body thoroughly to remove the shampoo.  Then [employer] and genitals (private parts) with your normal soap and rinse thoroughly to remove soap.  After that Use CHG Soap as you would any other liquid soap. You can apply CHG directly to the skin and wash gently with a scrungie or a clean washcloth.   Apply the CHG Soap to your body ONLY FROM THE NECK DOWN.  Do not use on open wounds or open sores. Avoid contact with your eyes, ears, mouth and genitals (private parts). Wash Face and genitals (private parts)  with your normal soap.   Wash thoroughly, paying special attention to the area where your surgery will be performed.  Thoroughly rinse your body with warm water from the neck down.  DO NOT shower/wash with your normal soap after using and rinsing off the CHG Soap.  Pat yourself dry with a CLEAN TOWEL.  Wear CLEAN PAJAMAS to bed the night before surgery  Place CLEAN SHEETS on your bed the night before your surgery  DO NOT SLEEP WITH PETS.   Day of Surgery:  Take a shower with CHG soap. Wear Clean/Comfortable clothing the morning of surgery Do not apply any deodorants/lotions.   Remember to brush your teeth WITH YOUR REGULAR TOOTHPASTE.   Please read over the following fact sheets that you were given.

## 2021-01-15 ENCOUNTER — Other Ambulatory Visit: Payer: Self-pay

## 2021-01-15 ENCOUNTER — Encounter (HOSPITAL_COMMUNITY)
Admission: RE | Admit: 2021-01-15 | Discharge: 2021-01-15 | Disposition: A | Discharge status: Home / Self Care | Payer: Medicare Other | Source: Ambulatory Visit | Attending: Thoracic Surgery (Cardiothoracic Vascular Surgery) | Admitting: Thoracic Surgery (Cardiothoracic Vascular Surgery)

## 2021-01-15 ENCOUNTER — Ambulatory Visit (HOSPITAL_COMMUNITY)
Admission: RE | Admit: 2021-01-15 | Discharge: 2021-01-15 | Disposition: A | Discharge status: Home / Self Care | Payer: Medicare Other | Source: Ambulatory Visit | Attending: Thoracic Surgery (Cardiothoracic Vascular Surgery) | Admitting: Thoracic Surgery (Cardiothoracic Vascular Surgery)

## 2021-01-15 ENCOUNTER — Encounter (HOSPITAL_COMMUNITY): Payer: Self-pay

## 2021-01-15 VITALS — BP 132/72 | HR 71 | Temp 97.9°F | Resp 18 | Ht 70.0 in | Wt 215.1 lb

## 2021-01-15 DIAGNOSIS — R739 Hyperglycemia, unspecified: Secondary | ICD-10-CM | POA: Diagnosis not present

## 2021-01-15 DIAGNOSIS — Z01818 Encounter for other preprocedural examination: Secondary | ICD-10-CM | POA: Diagnosis not present

## 2021-01-15 DIAGNOSIS — I25119 Atherosclerotic heart disease of native coronary artery with unspecified angina pectoris: Secondary | ICD-10-CM | POA: Diagnosis not present

## 2021-01-15 DIAGNOSIS — Z5181 Encounter for therapeutic drug level monitoring: Secondary | ICD-10-CM

## 2021-01-15 HISTORY — DX: Depression, unspecified: F32.A

## 2021-01-15 HISTORY — DX: Dizziness and giddiness: R42

## 2021-01-15 HISTORY — DX: Atherosclerotic heart disease of native coronary artery without angina pectoris: I25.10

## 2021-01-15 HISTORY — DX: Hypothyroidism, unspecified: E03.9

## 2021-01-15 HISTORY — DX: Abdominal aortic aneurysm, without rupture, unspecified: I71.40

## 2021-01-15 LAB — CBC
HCT: 40.6 % (ref 39.0–52.0)
Hemoglobin: 13.6 g/dL (ref 13.0–17.0)
MCH: 30.8 pg (ref 26.0–34.0)
MCHC: 33.5 g/dL (ref 30.0–36.0)
MCV: 91.9 fL (ref 80.0–100.0)
Platelets: 236 10*3/uL (ref 150–400)
RBC: 4.42 MIL/uL (ref 4.22–5.81)
RDW: 12.6 % (ref 11.5–15.5)
WBC: 8.8 10*3/uL (ref 4.0–10.5)
nRBC: 0 % (ref 0.0–0.2)

## 2021-01-15 LAB — COMPREHENSIVE METABOLIC PANEL
ALT: 18 U/L (ref 0–44)
AST: 22 U/L (ref 15–41)
Albumin: 3.5 g/dL (ref 3.5–5.0)
Alkaline Phosphatase: 50 U/L (ref 38–126)
Anion gap: 9 (ref 5–15)
BUN: 12 mg/dL (ref 8–23)
CO2: 22 mmol/L (ref 22–32)
Calcium: 9.6 mg/dL (ref 8.9–10.3)
Chloride: 108 mmol/L (ref 98–111)
Creatinine, Ser: 1.28 mg/dL — ABNORMAL HIGH (ref 0.61–1.24)
GFR, Estimated: 58 mL/min — ABNORMAL LOW (ref >60)
Glucose, Bld: 100 mg/dL — ABNORMAL HIGH (ref 70–99)
Potassium: 3.9 mmol/L (ref 3.5–5.1)
Sodium: 139 mmol/L (ref 135–145)
Total Bilirubin: 0.6 mg/dL (ref 0.3–1.2)
Total Protein: 6.6 g/dL (ref 6.5–8.1)

## 2021-01-15 LAB — BLOOD GAS, ARTERIAL
Acid-base deficit: 0.5 mmol/L (ref 0.0–2.0)
Bicarbonate: 23.5 mmol/L (ref 20.0–28.0)
FIO2: 21
O2 Saturation: 97.5 %
Patient temperature: 37
pCO2 arterial: 38 mmHg (ref 32.0–48.0)
pH, Arterial: 7.408 (ref 7.350–7.450)
pO2, Arterial: 99.2 mmHg (ref 83.0–108.0)

## 2021-01-15 LAB — APTT: aPTT: 28 seconds (ref 24–36)

## 2021-01-15 LAB — URINALYSIS, ROUTINE W REFLEX MICROSCOPIC
Bilirubin Urine: NEGATIVE
Glucose, UA: NEGATIVE mg/dL
Hgb urine dipstick: NEGATIVE
Ketones, ur: NEGATIVE mg/dL
Leukocytes,Ua: NEGATIVE
Nitrite: NEGATIVE
Protein, ur: NEGATIVE mg/dL
Specific Gravity, Urine: 1.015 (ref 1.005–1.030)
pH: 5 (ref 5.0–8.0)

## 2021-01-15 LAB — SURGICAL PCR SCREEN
MRSA, PCR: NEGATIVE
Staphylococcus aureus: NEGATIVE

## 2021-01-15 LAB — HEMOGLOBIN A1C
Hgb A1c MFr Bld: 5.2 % (ref 4.8–5.6)
Mean Plasma Glucose: 102.54 mg/dL

## 2021-01-15 LAB — TYPE AND SCREEN
ABO/RH(D): O POS
Antibody Screen: NEGATIVE

## 2021-01-15 LAB — PROTIME-INR
INR: 1.1 (ref 0.8–1.2)
Prothrombin Time: 14.1 seconds (ref 11.4–15.2)

## 2021-01-15 NOTE — Progress Notes (Signed)
PCP - Micheline Rough, MD Cardiologist - MD Hilty  PPM/ICD - n/a  Chest x-ray - 01/15/21 in PAT EKG - 01/02/21 Stress Test - 03/10/17 ECHO - 01/10/21 Cardiac Cath - 01/02/21  Sleep Study - yes CPAP - wears nightly  Fasting Blood Sugar - n/a  Blood Thinner Instructions: n/a Aspirin Instructions: As of today, STOP taking any Aspirin (unless otherwise instructed by your surgeon) Aleve, Naproxen, Ibuprofen, Motrin, Advil, Goody's, BC's, all herbal medications, fish oil, and all vitamins.  NPO at midnight  COVID TEST- pt and his wife uncomfortable with having to drive from [place], [place] to get covid test done on Friday. Per Royann Shivers, RN and Levonne Spiller, RN, okay for pt to arrive 3 hrs early prior to surgery and have covid test done that A.M. Pt instructed to wear mask until then.  Anesthesia review: yes, cardiac hx  Patient denies shortness of breath, fever, cough and chest pain at PAT appointment   All instructions explained to the patient, with a verbal understanding of the material. Patient agrees to go over the instructions while at home for a better understanding. Patient also instructed to self quarantine after being tested for COVID-19. The opportunity to ask questions was provided.

## 2021-01-15 NOTE — Anesthesia Preprocedure Evaluation (Addendum)
Anesthesia Evaluation  Patient identified by MRN, date of birth, ID band Patient awake    Reviewed: Allergy & Precautions, NPO status , Patient's Chart, lab work & pertinent test results, reviewed documented beta blocker date and time   Airway Mallampati: I  TM Distance: >3 FB Neck ROM: Full    Dental  (+) Dental Advisory Given, Lower Dentures, Upper Dentures   Pulmonary sleep apnea and Continuous Positive Airway Pressure Ventilation , former smoker,    Pulmonary exam normal breath sounds clear to auscultation       Cardiovascular hypertension, Pt. on home beta blockers + angina + CAD and + Peripheral Vascular Disease (s/p EVAR)  Normal cardiovascular exam Rhythm:Regular Rate:Normal  TAA (3.9 cm ascending aorta 12/27/20 CCTA)  Echo 01/10/21: IMPRESSIONS  1. Hypokinesis of the inferolateral and basal inferior wall with overall  mild LV dysfunction.  2. Left ventricular ejection fraction, by estimation, is 45 to 50%. The  left ventricle has mildly decreased function. The left ventricle  demonstrates regional wall motion abnormalities (see scoring  diagram/findings for description). There is moderate  left ventricular hypertrophy. Left ventricular diastolic parameters are  consistent with Grade I diastolic dysfunction (impaired relaxation).  3. Right ventricular systolic function is normal. The right ventricular  size is normal. There is normal pulmonary artery systolic pressure.  4. The mitral valve is normal in structure. Mild to moderate mitral valve  regurgitation. No evidence of mitral stenosis.  5. The aortic valve is tricuspid. Aortic valve regurgitation is trivial.  Aortic valve sclerosis is present, with no evidence of aortic valve  stenosis.  6. Aortic dilatation noted. There is borderline dilatation of the aortic  root, measuring 38 mm. There is mild dilatation of the ascending aorta,  measuring 44 mm.  7. The  inferior vena cava is normal in size with greater than 50%  respiratory variability, suggesting right atrial pressure of 3 mmHg.  - Comparison(s): No prior Echocardiogram.    Neuro/Psych PSYCHIATRIC DISORDERS Anxiety Depression negative neurological ROS     GI/Hepatic Neg liver ROS, hiatal hernia, GERD  Medicated,  Endo/Other  Hypothyroidism Obesity   Renal/GU negative Renal ROS     Musculoskeletal  (+) Arthritis ,   Abdominal   Peds  Hematology negative hematology ROS (+)   Anesthesia Other Findings   Reproductive/Obstetrics                           Anesthesia Physical Anesthesia Plan  ASA: 4  Anesthesia Plan: General   Post-op Pain Management:    Induction: Intravenous  PONV Risk Score and Plan: 2 and Treatment may vary due to age or medical condition and Midazolam  Airway Management Planned: Oral ETT  Additional Equipment: Arterial line, CVP, TEE and Ultrasound Guidance Line Placement  Intra-op Plan:   Post-operative Plan: Post-operative intubation/ventilation  Informed Consent: I have reviewed the patients History and Physical, chart, labs and discussed the procedure including the risks, benefits and alternatives for the proposed anesthesia with the patient or authorized representative who has indicated his/her understanding and acceptance.     Dental advisory given  Plan Discussed with: CRNA  Anesthesia Plan Comments: (PAT note written 01/15/2021 by Myra Gianotti, PA-C. )      Anesthesia Quick Evaluation

## 2021-01-15 NOTE — Progress Notes (Signed)
Anesthesia Chart Review:  Case: [id number] Date/Time: 01/20/21 1100   Procedures:      CORONARY ARTERY BYPASS GRAFTING (CABG) (Chest)     TRANSESOPHAGEAL ECHOCARDIOGRAM (TEE)   Anesthesia type: General   Pre-op diagnosis: CAD   Location: [facility] OR ROOM 71 / [facility]   Surgeons: Lajuana Matte, MD       DISCUSSION: Patient is a 75 year old male scheduled for the above procedure.  History includes former smoker (quit 02/23/06), CAD, GERD, hiatal hernia, OSA, HLD, hypothyroidism, exertional dyspnea, vertigo, AAA (s/p EVAR 04/21/17), TAA (3.9 cm ascending aorta 12/27/20 CCTA).  Presurgical carotid Doppler report is in process.  Patient lives in [place], [place].  Patient and wife thought comfortable driving to [facility] on 01/17/21 for COVID-19 testing, so plan for preoperative COVID-19 testing on the day of surgery with arrival time 3 hours early.  Anesthesia team to evaluate on the day of surgery.   VS: BP 132/72   Pulse 71   Temp 36.6 C (Oral)   Resp 18   Ht 5\' 10"  (1.778 m)   Wt 97.6 kg   SpO2 98%   BMI 30.86 kg/m    PROVIDERS: Caren Macadam, MD is PCP  Debara Pickett Nadean Corwin, MD is cardiologist Trula Slade, V. Victorio Palm, MD is vascular surgeon   LABS: Labs reviewed: Acceptable for surgery. (all labs ordered are listed, but only abnormal results are displayed)  Labs Reviewed  COMPREHENSIVE METABOLIC PANEL - Abnormal; Notable for the following components:      Result Value   Glucose, Bld 100 (*)    Creatinine, Ser 1.28 (*)    GFR, Estimated 58 (*)    All other components within normal limits  BLOOD GAS, ARTERIAL - Abnormal; Notable for the following components:   Allens test (pass/fail) BRACHIAL ARTERY (*)    All other components within normal limits  SURGICAL PCR SCREEN  CBC  PROTIME-INR  APTT  URINALYSIS, ROUTINE W REFLEX MICROSCOPIC  HEMOGLOBIN A1C  TYPE AND SCREEN     IMAGES: CXR 01/15/21: FINDINGS: - Cardiomediastinal silhouette unchanged in size and  contour. - Reticular opacities of the lungs, with no new confluent airspace disease. - Similar appearance of low lung volumes with asymmetric elevation of the right hemidiaphragm. - No pneumothorax or pleural effusion. - No acute displaced fracture. Degenerative changes of the spine. - Chronic deformity of left-sided ribs. IMPRESSION: Stable appearance of the chest x-ray over time, with nonspecific changes of fibrosis/scarring and no evidence of acute cardiopulmonary disease    EKG: 01/02/21: Sinus rhythm with marked sinus arrhythmia ST & T wave abnormality, consider lateral ischemia Prolonged QT Abnormal ECG Since last tracing lateral T waves changes are new Confirmed by Larae Grooms [phone]) on 01/03/2021 4:26:54 PM   CV: US Carotid 01/15/21: In process. PSV cm/s proximal RICA 64, distal RICA 88, proximal LICA 51, distal LICA 99.  Echo 01/10/21: IMPRESSIONS   1. Hypokinesis of the inferolateral and basal inferior wall with overall  mild LV dysfunction.   2. Left ventricular ejection fraction, by estimation, is 45 to 50%. The  left ventricle has mildly decreased function. The left ventricle  demonstrates regional wall motion abnormalities (see scoring  diagram/findings for description). There is moderate  left ventricular hypertrophy. Left ventricular diastolic parameters are  consistent with Grade I diastolic dysfunction (impaired relaxation).   3. Right ventricular systolic function is normal. The right ventricular  size is normal. There is normal pulmonary artery systolic pressure.   4. The mitral valve is normal  in structure. Mild to moderate mitral valve  regurgitation. No evidence of mitral stenosis.   5. The aortic valve is tricuspid. Aortic valve regurgitation is trivial.  Aortic valve sclerosis is present, with no evidence of aortic valve  stenosis.   6. Aortic dilatation noted. There is borderline dilatation of the aortic  root, measuring 38 mm. There is  mild dilatation of the ascending aorta,  measuring 44 mm.   7. The inferior vena cava is normal in size with greater than 50%  respiratory variability, suggesting right atrial pressure of 3 mmHg.  - Comparison(s): No prior Echocardiogram.    Cardiac cath 01/02/21:   Ost RCA to Prox RCA lesion is 99% stenosed.   Prox RCA to Mid RCA lesion is 100% stenosed.   Prox LAD lesion is 70% stenosed with 0% stenosed side branch in 1st Sept.   1st Mrg lesion is 90% stenosed.   Prox Cx to Mid Cx lesion is 90% stenosed.   There is mild left ventricular systolic dysfunction.   LV end diastolic pressure is mildly elevated.   The left ventricular ejection fraction is 45-50% by visual estimate.   Severe 3 vessel obstructive CAD with heavily calcified coronaries. There is a 70% stenosis of the LAD at the takeoff of the first diagonal with abnormal RFR of 0.86. there is 90% stenosis in a large first OM and in the continuation of the LCx. The RCA is chronically occluded proximally with good left to right collaterals. Mild LV dysfunction EF estimated at 45% with anterior hypokinesis Mildly elevated LVEDP 22 mm Hg Plan: referral to CT surgery for consideration of CABG   [place] Abd Aorta 10/14/20: Summary:  Abdominal Aorta: Patent endovascular repair. The largest aortic diameter  has decreased compared to prior exam. Previous diameter measurement was  4.5 cm obtained on 09/04/2019.    Past Medical History:  Diagnosis Date   Allergy    Anxiety    Arthritis    Coronary artery disease    Depression    Dyspnea    with exertion   GERD (gastroesophageal reflux disease)    History of hiatal hernia    History of kidney stones    Hx of colonic polyps 03/31/2011   Hyperlipidemia    Hypothyroidism    Neuromuscular disorder (HCC)    hiatal hernia   Sleep apnea    cpap   Vertigo     Past Surgical History:  Procedure Laterality Date   ABDOMINAL AORTIC ENDOVASCULAR STENT GRAFT N/A 04/21/2017   Procedure:  ABDOMINAL AORTIC ENDOVASCULAR STENT GRAFT;  Surgeon: Serafina Mitchell, MD;  Location: [facility];  Service: Vascular;  Laterality: N/A;   APPENDECTOMY     CHOLECYSTECTOMY     COLONOSCOPY     last 05/2016   ELBOW SURGERY Left    as teenager   EYE SURGERY Bilateral 05/21/2020   Cataract Surgery   HERNIA REPAIR Right    inguinal   INTRAVASCULAR PRESSURE WIRE/FFR STUDY N/A 01/02/2021   Procedure: INTRAVASCULAR PRESSURE WIRE/FFR STUDY;  Surgeon: Martinique, Peter M, MD;  Location: [facility] [place] [facility];  Service: Cardiovascular;  Laterality: N/A;   KNEE SURGERY Right    scope - cartilage removed   LEFT HEART CATH AND CORONARY ANGIOGRAPHY N/A 01/02/2021   Procedure: LEFT HEART CATH AND CORONARY ANGIOGRAPHY;  Surgeon: Martinique, Peter M, MD;  Location: [place] [facility];  Service: Cardiovascular;  Laterality: N/A;   POLYPECTOMY  2018   TONSILLECTOMY      MEDICATIONS:  aspirin 81 MG EC tablet   capsaicin (ZOSTRIX) 0.025 % cream   carvedilol (COREG) 12.5 MG tablet   Cholecalciferol (VITAMIN D3) 125 MCG (5000 UT) CAPS   Cyanocobalamin (VITAMIN B-12 PO)   levothyroxine (SYNTHROID) 25 MCG tablet   nitroGLYCERIN (NITROSTAT) 0.4 MG SL tablet   omeprazole (PRILOSEC) 20 MG capsule   PARoxetine (PAXIL) 20 MG tablet   POTASSIUM PO   rosuvastatin (CRESTOR) 40 MG tablet   No current facility-administered medications for this encounter.    Myra Gianotti, PA-C Surgical Short Stay/Anesthesiology [facility] Phone [phone] [facility] Phone [phone] 01/15/2021 6:10 PM

## 2021-01-17 ENCOUNTER — Other Ambulatory Visit (HOSPITAL_COMMUNITY): Payer: Medicare Other

## 2021-01-17 MED ORDER — VANCOMYCIN HCL 1500 MG/300ML IV SOLN
1500.0000 mg | INTRAVENOUS | Status: AC
  Administered 2021-01-20: 12:00:00 1500 mg via INTRAVENOUS
  Filled 2021-01-17: qty 300

## 2021-01-17 MED ORDER — HEPARIN 30,000 UNITS/1000 ML (OHS) CELLSAVER SOLUTION
Status: DC
  Filled 2021-01-17: qty 1000

## 2021-01-17 MED ORDER — DEXMEDETOMIDINE HCL IN NACL 400 MCG/100ML IV SOLN
0.1000 ug/kg/h | INTRAVENOUS | Status: AC
  Administered 2021-01-20: 13:00:00 .4 ug/kg/h via INTRAVENOUS
  Filled 2021-01-17: qty 100

## 2021-01-17 MED ORDER — MILRINONE LACTATE IN DEXTROSE 20-5 MG/100ML-% IV SOLN
0.3000 ug/kg/min | INTRAVENOUS | Status: DC
  Filled 2021-01-17: qty 100

## 2021-01-17 MED ORDER — POTASSIUM CHLORIDE 2 MEQ/ML IV SOLN
80.0000 meq | INTRAVENOUS | Status: DC
  Filled 2021-01-17: qty 40

## 2021-01-17 MED ORDER — TRANEXAMIC ACID (OHS) PUMP PRIME SOLUTION
2.0000 mg/kg | INTRAVENOUS | Status: DC
  Filled 2021-01-17: qty 1.95

## 2021-01-17 MED ORDER — INSULIN REGULAR(HUMAN) IN NACL 100-0.9 UT/100ML-% IV SOLN
INTRAVENOUS | Status: AC
  Administered 2021-01-20: 13:00:00 .1 [IU]/h via INTRAVENOUS
  Administered 2021-01-20: 15:00:00 .4 [IU]/h via INTRAVENOUS
  Filled 2021-01-17: qty 100

## 2021-01-17 MED ORDER — PLASMA-LYTE A IV SOLN
INTRAVENOUS | Status: DC
  Filled 2021-01-17: qty 5

## 2021-01-17 MED ORDER — NITROGLYCERIN IN D5W 200-5 MCG/ML-% IV SOLN
2.0000 ug/min | INTRAVENOUS | Status: DC
  Filled 2021-01-17: qty 250

## 2021-01-17 MED ORDER — TRANEXAMIC ACID (OHS) BOLUS VIA INFUSION
15.0000 mg/kg | INTRAVENOUS | Status: AC
  Administered 2021-01-20: 13:00:00 1242 mg via INTRAVENOUS
  Filled 2021-01-17: qty 1242

## 2021-01-17 MED ORDER — TRANEXAMIC ACID 1000 MG/10ML IV SOLN
1.5000 mg/kg/h | INTRAVENOUS | Status: AC
  Administered 2021-01-20: 13:00:00 1.5 mg/kg/h via INTRAVENOUS
  Filled 2021-01-17: qty 25

## 2021-01-17 MED ORDER — MANNITOL 20 % IV SOLN
INTRAVENOUS | Status: DC
  Filled 2021-01-17: qty 13

## 2021-01-17 MED ORDER — PHENYLEPHRINE HCL-NACL 20-0.9 MG/250ML-% IV SOLN
30.0000 ug/min | INTRAVENOUS | Status: AC
  Administered 2021-01-20: 16:00:00 50 ug/min via INTRAVENOUS
  Filled 2021-01-17: qty 250

## 2021-01-17 MED ORDER — LEVOFLOXACIN IN D5W 500 MG/100ML IV SOLN
500.0000 mg | INTRAVENOUS | Status: AC
  Administered 2021-01-20: 13:00:00 500 mg via INTRAVENOUS
  Filled 2021-01-17: qty 100

## 2021-01-17 MED ORDER — NOREPINEPHRINE 4 MG/250ML-% IV SOLN
0.0000 ug/min | INTRAVENOUS | Status: DC
  Filled 2021-01-17: qty 250

## 2021-01-17 MED ORDER — EPINEPHRINE HCL 5 MG/250ML IV SOLN IN NS
0.0000 ug/min | INTRAVENOUS | Status: DC
  Filled 2021-01-17: qty 250

## 2021-01-20 ENCOUNTER — Inpatient Hospital Stay (HOSPITAL_COMMUNITY): Payer: Medicare Other | Admitting: Anesthesiology

## 2021-01-20 ENCOUNTER — Inpatient Hospital Stay (HOSPITAL_COMMUNITY): Payer: Medicare Other

## 2021-01-20 ENCOUNTER — Inpatient Hospital Stay (HOSPITAL_COMMUNITY): Payer: Medicare Other | Admitting: Vascular Surgery

## 2021-01-20 ENCOUNTER — Other Ambulatory Visit: Payer: Self-pay

## 2021-01-20 ENCOUNTER — Encounter (HOSPITAL_COMMUNITY): Payer: Self-pay | Admitting: Thoracic Surgery (Cardiothoracic Vascular Surgery)

## 2021-01-20 ENCOUNTER — Inpatient Hospital Stay (HOSPITAL_COMMUNITY)
Admission: RE | Admit: 2021-01-20 | Discharge: 2021-01-25 | DRG: 235 | Disposition: A | Discharge status: Home / Self Care | Payer: Medicare Other | Attending: Thoracic Surgery (Cardiothoracic Vascular Surgery) | Admitting: Thoracic Surgery (Cardiothoracic Vascular Surgery)

## 2021-01-20 ENCOUNTER — Inpatient Hospital Stay (HOSPITAL_COMMUNITY)
Admission: RE | Disposition: A | Discharge status: Home / Self Care | Payer: Self-pay | Source: Home / Self Care | Attending: Thoracic Surgery (Cardiothoracic Vascular Surgery)

## 2021-01-20 DIAGNOSIS — Z91013 Allergy to seafood: Secondary | ICD-10-CM

## 2021-01-20 DIAGNOSIS — Z20822 Contact with and (suspected) exposure to covid-19: Secondary | ICD-10-CM | POA: Diagnosis present

## 2021-01-20 DIAGNOSIS — I2510 Atherosclerotic heart disease of native coronary artery without angina pectoris: Secondary | ICD-10-CM | POA: Diagnosis not present

## 2021-01-20 DIAGNOSIS — Z9911 Dependence on respirator [ventilator] status: Secondary | ICD-10-CM | POA: Diagnosis not present

## 2021-01-20 DIAGNOSIS — Z8679 Personal history of other diseases of the circulatory system: Secondary | ICD-10-CM | POA: Diagnosis not present

## 2021-01-20 DIAGNOSIS — Z91030 Bee allergy status: Secondary | ICD-10-CM | POA: Diagnosis not present

## 2021-01-20 DIAGNOSIS — E871 Hypo-osmolality and hyponatremia: Secondary | ICD-10-CM | POA: Diagnosis present

## 2021-01-20 DIAGNOSIS — R578 Other shock: Secondary | ICD-10-CM | POA: Diagnosis not present

## 2021-01-20 DIAGNOSIS — I2582 Chronic total occlusion of coronary artery: Secondary | ICD-10-CM | POA: Diagnosis present

## 2021-01-20 DIAGNOSIS — E669 Obesity, unspecified: Secondary | ICD-10-CM | POA: Diagnosis not present

## 2021-01-20 DIAGNOSIS — Z09 Encounter for follow-up examination after completed treatment for conditions other than malignant neoplasm: Secondary | ICD-10-CM

## 2021-01-20 DIAGNOSIS — J9601 Acute respiratory failure with hypoxia: Secondary | ICD-10-CM | POA: Diagnosis present

## 2021-01-20 DIAGNOSIS — J302 Other seasonal allergic rhinitis: Secondary | ICD-10-CM | POA: Diagnosis present

## 2021-01-20 DIAGNOSIS — I493 Ventricular premature depolarization: Secondary | ICD-10-CM | POA: Diagnosis present

## 2021-01-20 DIAGNOSIS — J9811 Atelectasis: Secondary | ICD-10-CM | POA: Diagnosis not present

## 2021-01-20 DIAGNOSIS — I10 Essential (primary) hypertension: Secondary | ICD-10-CM | POA: Diagnosis present

## 2021-01-20 DIAGNOSIS — Z4682 Encounter for fitting and adjustment of non-vascular catheter: Secondary | ICD-10-CM | POA: Diagnosis not present

## 2021-01-20 DIAGNOSIS — N179 Acute kidney failure, unspecified: Secondary | ICD-10-CM | POA: Diagnosis not present

## 2021-01-20 DIAGNOSIS — K219 Gastro-esophageal reflux disease without esophagitis: Secondary | ICD-10-CM | POA: Diagnosis present

## 2021-01-20 DIAGNOSIS — Z6832 Body mass index (BMI) 32.0-32.9, adult: Secondary | ICD-10-CM | POA: Diagnosis not present

## 2021-01-20 DIAGNOSIS — E785 Hyperlipidemia, unspecified: Secondary | ICD-10-CM | POA: Diagnosis present

## 2021-01-20 DIAGNOSIS — J449 Chronic obstructive pulmonary disease, unspecified: Secondary | ICD-10-CM | POA: Diagnosis not present

## 2021-01-20 DIAGNOSIS — I110 Hypertensive heart disease with heart failure: Secondary | ICD-10-CM | POA: Diagnosis not present

## 2021-01-20 DIAGNOSIS — Z951 Presence of aortocoronary bypass graft: Secondary | ICD-10-CM

## 2021-01-20 DIAGNOSIS — G4733 Obstructive sleep apnea (adult) (pediatric): Secondary | ICD-10-CM

## 2021-01-20 DIAGNOSIS — I25119 Atherosclerotic heart disease of native coronary artery with unspecified angina pectoris: Secondary | ICD-10-CM

## 2021-01-20 DIAGNOSIS — J90 Pleural effusion, not elsewhere classified: Secondary | ICD-10-CM | POA: Diagnosis not present

## 2021-01-20 DIAGNOSIS — R918 Other nonspecific abnormal finding of lung field: Secondary | ICD-10-CM | POA: Diagnosis not present

## 2021-01-20 DIAGNOSIS — Z880 Allergy status to penicillin: Secondary | ICD-10-CM

## 2021-01-20 DIAGNOSIS — R739 Hyperglycemia, unspecified: Secondary | ICD-10-CM | POA: Diagnosis present

## 2021-01-20 DIAGNOSIS — I509 Heart failure, unspecified: Secondary | ICD-10-CM | POA: Diagnosis not present

## 2021-01-20 DIAGNOSIS — Z8249 Family history of ischemic heart disease and other diseases of the circulatory system: Secondary | ICD-10-CM

## 2021-01-20 DIAGNOSIS — J810 Acute pulmonary edema: Secondary | ICD-10-CM | POA: Diagnosis not present

## 2021-01-20 DIAGNOSIS — Z91040 Latex allergy status: Secondary | ICD-10-CM

## 2021-01-20 DIAGNOSIS — I517 Cardiomegaly: Secondary | ICD-10-CM | POA: Diagnosis not present

## 2021-01-20 DIAGNOSIS — Z823 Family history of stroke: Secondary | ICD-10-CM

## 2021-01-20 DIAGNOSIS — D62 Acute posthemorrhagic anemia: Secondary | ICD-10-CM | POA: Diagnosis not present

## 2021-01-20 DIAGNOSIS — E039 Hypothyroidism, unspecified: Secondary | ICD-10-CM | POA: Diagnosis present

## 2021-01-20 DIAGNOSIS — I480 Paroxysmal atrial fibrillation: Secondary | ICD-10-CM | POA: Diagnosis present

## 2021-01-20 DIAGNOSIS — I200 Unstable angina: Secondary | ICD-10-CM

## 2021-01-20 DIAGNOSIS — Z87891 Personal history of nicotine dependence: Secondary | ICD-10-CM | POA: Diagnosis not present

## 2021-01-20 DIAGNOSIS — R579 Shock, unspecified: Secondary | ICD-10-CM | POA: Diagnosis not present

## 2021-01-20 HISTORY — PX: CORONARY ARTERY BYPASS GRAFT: SHX141

## 2021-01-20 HISTORY — PX: TEE WITHOUT CARDIOVERSION: SHX5443

## 2021-01-20 HISTORY — PX: ENDOVEIN HARVEST OF GREATER SAPHENOUS VEIN: SHX5059

## 2021-01-20 LAB — POCT I-STAT 7, (LYTES, BLD GAS, ICA,H+H)
Acid-Base Excess: 2 mmol/L (ref 0.0–2.0)
Acid-Base Excess: 1 mmol/L (ref 0.0–2.0)
Acid-Base Excess: 3 mmol/L — ABNORMAL HIGH (ref 0.0–2.0)
Acid-Base Excess: 0 mmol/L (ref 0.0–2.0)
Acid-base deficit: 3 mmol/L — ABNORMAL HIGH (ref 0.0–2.0)
Acid-base deficit: 1 mmol/L (ref 0.0–2.0)
Bicarbonate: 21.3 mmol/L (ref 20.0–28.0)
Bicarbonate: 26.3 mmol/L (ref 20.0–28.0)
Bicarbonate: 24.9 mmol/L (ref 20.0–28.0)
Bicarbonate: 26.4 mmol/L (ref 20.0–28.0)
Bicarbonate: 24.9 mmol/L (ref 20.0–28.0)
Bicarbonate: 25 mmol/L (ref 20.0–28.0)
Calcium, Ion: 1.25 mmol/L (ref 1.15–1.40)
Calcium, Ion: 1.06 mmol/L — ABNORMAL LOW (ref 1.15–1.40)
Calcium, Ion: 1.03 mmol/L — ABNORMAL LOW (ref 1.15–1.40)
Calcium, Ion: 1.08 mmol/L — ABNORMAL LOW (ref 1.15–1.40)
Calcium, Ion: 1.1 mmol/L — ABNORMAL LOW (ref 1.15–1.40)
Calcium, Ion: 1.25 mmol/L (ref 1.15–1.40)
HCT: 37 % — ABNORMAL LOW (ref 39.0–52.0)
HCT: 27 % — ABNORMAL LOW (ref 39.0–52.0)
HCT: 26 % — ABNORMAL LOW (ref 39.0–52.0)
HCT: 28 % — ABNORMAL LOW (ref 39.0–52.0)
HCT: 28 % — ABNORMAL LOW (ref 39.0–52.0)
HCT: 28 % — ABNORMAL LOW (ref 39.0–52.0)
Hemoglobin: 12.6 g/dL — ABNORMAL LOW (ref 13.0–17.0)
Hemoglobin: 9.2 g/dL — ABNORMAL LOW (ref 13.0–17.0)
Hemoglobin: 8.8 g/dL — ABNORMAL LOW (ref 13.0–17.0)
Hemoglobin: 9.5 g/dL — ABNORMAL LOW (ref 13.0–17.0)
Hemoglobin: 9.5 g/dL — ABNORMAL LOW (ref 13.0–17.0)
Hemoglobin: 9.5 g/dL — ABNORMAL LOW (ref 13.0–17.0)
O2 Saturation: 100 %
O2 Saturation: 100 %
O2 Saturation: 100 %
O2 Saturation: 100 %
O2 Saturation: 100 %
O2 Saturation: 99 %
Potassium: 3.5 mmol/L (ref 3.5–5.1)
Potassium: 3.6 mmol/L (ref 3.5–5.1)
Potassium: 4.7 mmol/L (ref 3.5–5.1)
Potassium: 4.8 mmol/L (ref 3.5–5.1)
Potassium: 4.2 mmol/L (ref 3.5–5.1)
Potassium: 4.8 mmol/L (ref 3.5–5.1)
Sodium: 143 mmol/L (ref 135–145)
Sodium: 140 mmol/L (ref 135–145)
Sodium: 138 mmol/L (ref 135–145)
Sodium: 139 mmol/L (ref 135–145)
Sodium: 139 mmol/L (ref 135–145)
Sodium: 141 mmol/L (ref 135–145)
TCO2: 22 mmol/L (ref 22–32)
TCO2: 28 mmol/L (ref 22–32)
TCO2: 26 mmol/L (ref 22–32)
TCO2: 28 mmol/L (ref 22–32)
TCO2: 26 mmol/L (ref 22–32)
TCO2: 26 mmol/L (ref 22–32)
pCO2 arterial: 35.7 mmHg (ref 32.0–48.0)
pCO2 arterial: 40.8 mmHg (ref 32.0–48.0)
pCO2 arterial: 34.5 mmHg (ref 32.0–48.0)
pCO2 arterial: 36.8 mmHg (ref 32.0–48.0)
pCO2 arterial: 39.9 mmHg (ref 32.0–48.0)
pCO2 arterial: 44.3 mmHg (ref 32.0–48.0)
pH, Arterial: 7.384 (ref 7.350–7.450)
pH, Arterial: 7.417 (ref 7.350–7.450)
pH, Arterial: 7.464 — ABNORMAL HIGH (ref 7.350–7.450)
pH, Arterial: 7.465 — ABNORMAL HIGH (ref 7.350–7.450)
pH, Arterial: 7.358 (ref 7.350–7.450)
pH, Arterial: 7.406 (ref 7.350–7.450)
pO2, Arterial: 294 mmHg — ABNORMAL HIGH (ref 83.0–108.0)
pO2, Arterial: 338 mmHg — ABNORMAL HIGH (ref 83.0–108.0)
pO2, Arterial: 240 mmHg — ABNORMAL HIGH (ref 83.0–108.0)
pO2, Arterial: 299 mmHg — ABNORMAL HIGH (ref 83.0–108.0)
pO2, Arterial: 122 mmHg — ABNORMAL HIGH (ref 83.0–108.0)
pO2, Arterial: 379 mmHg — ABNORMAL HIGH (ref 83.0–108.0)

## 2021-01-20 LAB — POCT I-STAT, CHEM 8
BUN: 14 mg/dL (ref 8–23)
BUN: 14 mg/dL (ref 8–23)
BUN: 12 mg/dL (ref 8–23)
BUN: 12 mg/dL (ref 8–23)
BUN: 13 mg/dL (ref 8–23)
Calcium, Ion: 1.27 mmol/L (ref 1.15–1.40)
Calcium, Ion: 1.28 mmol/L (ref 1.15–1.40)
Calcium, Ion: 1.04 mmol/L — ABNORMAL LOW (ref 1.15–1.40)
Calcium, Ion: 1.08 mmol/L — ABNORMAL LOW (ref 1.15–1.40)
Calcium, Ion: 1.27 mmol/L (ref 1.15–1.40)
Chloride: 104 mmol/L (ref 98–111)
Chloride: 103 mmol/L (ref 98–111)
Chloride: 103 mmol/L (ref 98–111)
Chloride: 103 mmol/L (ref 98–111)
Chloride: 104 mmol/L (ref 98–111)
Creatinine, Ser: 1.1 mg/dL (ref 0.61–1.24)
Creatinine, Ser: 1.2 mg/dL (ref 0.61–1.24)
Creatinine, Ser: 1 mg/dL (ref 0.61–1.24)
Creatinine, Ser: 1 mg/dL (ref 0.61–1.24)
Creatinine, Ser: 1.2 mg/dL (ref 0.61–1.24)
Glucose, Bld: 95 mg/dL (ref 70–99)
Glucose, Bld: 121 mg/dL — ABNORMAL HIGH (ref 70–99)
Glucose, Bld: 112 mg/dL — ABNORMAL HIGH (ref 70–99)
Glucose, Bld: 127 mg/dL — ABNORMAL HIGH (ref 70–99)
Glucose, Bld: 137 mg/dL — ABNORMAL HIGH (ref 70–99)
HCT: 34 % — ABNORMAL LOW (ref 39.0–52.0)
HCT: 33 % — ABNORMAL LOW (ref 39.0–52.0)
HCT: 26 % — ABNORMAL LOW (ref 39.0–52.0)
HCT: 26 % — ABNORMAL LOW (ref 39.0–52.0)
HCT: 27 % — ABNORMAL LOW (ref 39.0–52.0)
Hemoglobin: 11.6 g/dL — ABNORMAL LOW (ref 13.0–17.0)
Hemoglobin: 11.2 g/dL — ABNORMAL LOW (ref 13.0–17.0)
Hemoglobin: 8.8 g/dL — ABNORMAL LOW (ref 13.0–17.0)
Hemoglobin: 8.8 g/dL — ABNORMAL LOW (ref 13.0–17.0)
Hemoglobin: 9.2 g/dL — ABNORMAL LOW (ref 13.0–17.0)
Potassium: 3.5 mmol/L (ref 3.5–5.1)
Potassium: 3.7 mmol/L (ref 3.5–5.1)
Potassium: 4.7 mmol/L (ref 3.5–5.1)
Potassium: 4.2 mmol/L (ref 3.5–5.1)
Potassium: 4.8 mmol/L (ref 3.5–5.1)
Sodium: 143 mmol/L (ref 135–145)
Sodium: 141 mmol/L (ref 135–145)
Sodium: 138 mmol/L (ref 135–145)
Sodium: 139 mmol/L (ref 135–145)
Sodium: 141 mmol/L (ref 135–145)
TCO2: 26 mmol/L (ref 22–32)
TCO2: 26 mmol/L (ref 22–32)
TCO2: 26 mmol/L (ref 22–32)
TCO2: 25 mmol/L (ref 22–32)
TCO2: 26 mmol/L (ref 22–32)

## 2021-01-20 LAB — CBC
HCT: 31.8 % — ABNORMAL LOW (ref 39.0–52.0)
Hemoglobin: 11 g/dL — ABNORMAL LOW (ref 13.0–17.0)
MCH: 31.2 pg (ref 26.0–34.0)
MCHC: 34.6 g/dL (ref 30.0–36.0)
MCV: 90.1 fL (ref 80.0–100.0)
Platelets: 160 10*3/uL (ref 150–400)
RBC: 3.53 MIL/uL — ABNORMAL LOW (ref 4.22–5.81)
RDW: 12.6 % (ref 11.5–15.5)
WBC: 17.6 10*3/uL — ABNORMAL HIGH (ref 4.0–10.5)
nRBC: 0 % (ref 0.0–0.2)

## 2021-01-20 LAB — POCT I-STAT EG7
Acid-base deficit: 1 mmol/L (ref 0.0–2.0)
Bicarbonate: 24.6 mmol/L (ref 20.0–28.0)
Calcium, Ion: 1.09 mmol/L — ABNORMAL LOW (ref 1.15–1.40)
HCT: 30 % — ABNORMAL LOW (ref 39.0–52.0)
Hemoglobin: 10.2 g/dL — ABNORMAL LOW (ref 13.0–17.0)
O2 Saturation: 83 %
Potassium: 3.6 mmol/L (ref 3.5–5.1)
Sodium: 141 mmol/L (ref 135–145)
TCO2: 26 mmol/L (ref 22–32)
pCO2, Ven: 41.7 mmHg — ABNORMAL LOW (ref 44.0–60.0)
pH, Ven: 7.378 (ref 7.250–7.430)
pO2, Ven: 49 mmHg — ABNORMAL HIGH (ref 32.0–45.0)

## 2021-01-20 LAB — GLUCOSE, CAPILLARY
Glucose-Capillary: 125 mg/dL — ABNORMAL HIGH (ref 70–99)
Glucose-Capillary: 129 mg/dL — ABNORMAL HIGH (ref 70–99)
Glucose-Capillary: 123 mg/dL — ABNORMAL HIGH (ref 70–99)
Glucose-Capillary: 132 mg/dL — ABNORMAL HIGH (ref 70–99)

## 2021-01-20 LAB — PLATELET COUNT: Platelets: 196 10*3/uL (ref 150–400)

## 2021-01-20 LAB — HEMOGLOBIN AND HEMATOCRIT, BLOOD
HCT: 28.2 % — ABNORMAL LOW (ref 39.0–52.0)
Hemoglobin: 9.6 g/dL — ABNORMAL LOW (ref 13.0–17.0)

## 2021-01-20 LAB — PROTIME-INR
INR: 1.4 — ABNORMAL HIGH (ref 0.8–1.2)
Prothrombin Time: 17.1 seconds — ABNORMAL HIGH (ref 11.4–15.2)

## 2021-01-20 LAB — APTT: aPTT: 31 seconds (ref 24–36)

## 2021-01-20 LAB — SARS CORONAVIRUS 2 BY RT PCR (HOSPITAL ORDER, PERFORMED IN ~~LOC~~ HOSPITAL LAB): SARS Coronavirus 2: NEGATIVE

## 2021-01-20 SURGERY — CORONARY ARTERY BYPASS GRAFTING (CABG)
Anesthesia: General | Site: Leg Upper | Laterality: Right

## 2021-01-20 MED ORDER — DEXAMETHASONE SODIUM PHOSPHATE 10 MG/ML IJ SOLN
INTRAMUSCULAR | Status: AC
  Filled 2021-01-20: qty 1

## 2021-01-20 MED ORDER — PROTAMINE SULFATE 10 MG/ML IV SOLN
INTRAVENOUS | Status: AC
  Filled 2021-01-20: qty 25

## 2021-01-20 MED ORDER — MORPHINE SULFATE (PF) 2 MG/ML IV SOLN
1.0000 mg | INTRAVENOUS | Status: DC | PRN
  Administered 2021-01-21: 2 mg via INTRAVENOUS
  Filled 2021-01-20: qty 1

## 2021-01-20 MED ORDER — LACTATED RINGERS IV SOLN: 500.0000 mL | Freq: Once | INTRAVENOUS | Status: DC | PRN

## 2021-01-20 MED ORDER — ASPIRIN EC 325 MG PO TBEC
325.0000 mg | DELAYED_RELEASE_TABLET | Freq: Every day | ORAL | Status: DC
  Administered 2021-01-21 – 2021-01-25 (×5): 325 mg via ORAL
  Filled 2021-01-20 (×5): qty 1

## 2021-01-20 MED ORDER — DEXTROSE 50 % IV SOLN: 0.0000 mL | INTRAVENOUS | Status: DC | PRN

## 2021-01-20 MED ORDER — LEVOTHYROXINE SODIUM 25 MCG PO TABS
12.5000 ug | ORAL_TABLET | Freq: Every day | ORAL | Status: DC
  Administered 2021-01-21 – 2021-01-25 (×5): 12.5 ug via ORAL
  Filled 2021-01-20 (×5): qty 1

## 2021-01-20 MED ORDER — ORAL CARE MOUTH RINSE
15.0000 mL | OROMUCOSAL | Status: DC
  Administered 2021-01-20 – 2021-01-21 (×5): 15 mL via OROMUCOSAL

## 2021-01-20 MED ORDER — DOBUTAMINE IN D5W 4-5 MG/ML-% IV SOLN: 0.0000 ug/kg/min | INTRAVENOUS | Status: DC

## 2021-01-20 MED ORDER — VANCOMYCIN HCL IN DEXTROSE 1-5 GM/200ML-% IV SOLN
1000.0000 mg | Freq: Once | INTRAVENOUS | Status: AC
  Administered 2021-01-20: 22:00:00 1000 mg via INTRAVENOUS
  Filled 2021-01-20: qty 200

## 2021-01-20 MED ORDER — DOBUTAMINE INFUSION FOR EP/ECHO/NUC (1000 MCG/ML)
INTRAVENOUS | Status: DC | PRN
  Administered 2021-01-20: 2 ug/kg/min via INTRAVENOUS

## 2021-01-20 MED ORDER — NICARDIPINE HCL IN NACL 20-0.86 MG/200ML-% IV SOLN: 0.0000 mg/h | INTRAVENOUS | Status: DC

## 2021-01-20 MED ORDER — ROCURONIUM BROMIDE 10 MG/ML (PF) SYRINGE
PREFILLED_SYRINGE | INTRAVENOUS | Status: AC
  Filled 2021-01-20: qty 30

## 2021-01-20 MED ORDER — PAROXETINE HCL 10 MG PO TABS
10.0000 mg | ORAL_TABLET | Freq: Two times a day (BID) | ORAL | Status: DC
  Administered 2021-01-21 – 2021-01-25 (×9): 10 mg via ORAL
  Filled 2021-01-20 (×11): qty 1

## 2021-01-20 MED ORDER — FENTANYL CITRATE (PF) 250 MCG/5ML IJ SOLN
INTRAMUSCULAR | Status: DC | PRN
  Administered 2021-01-20: 150 ug via INTRAVENOUS
  Administered 2021-01-20: 50 ug via INTRAVENOUS
  Administered 2021-01-20: 150 ug via INTRAVENOUS
  Administered 2021-01-20: 100 ug via INTRAVENOUS
  Administered 2021-01-20: 150 ug via INTRAVENOUS
  Administered 2021-01-20 (×2): 100 ug via INTRAVENOUS
  Administered 2021-01-20: 200 ug via INTRAVENOUS

## 2021-01-20 MED ORDER — SODIUM CHLORIDE 0.9 % IV SOLN: INTRAVENOUS | Status: DC | PRN

## 2021-01-20 MED ORDER — FENTANYL CITRATE (PF) 250 MCG/5ML IJ SOLN
INTRAMUSCULAR | Status: AC
  Filled 2021-01-20: qty 20

## 2021-01-20 MED ORDER — ROSUVASTATIN CALCIUM 20 MG PO TABS
40.0000 mg | ORAL_TABLET | Freq: Every day | ORAL | Status: DC
  Administered 2021-01-21 – 2021-01-25 (×5): 40 mg via ORAL
  Filled 2021-01-20 (×5): qty 2

## 2021-01-20 MED ORDER — SODIUM CHLORIDE 0.9% FLUSH: 3.0000 mL | INTRAVENOUS | Status: DC | PRN

## 2021-01-20 MED ORDER — METOPROLOL TARTRATE 12.5 MG HALF TABLET: 12.5000 mg | ORAL_TABLET | Freq: Once | ORAL | Status: DC

## 2021-01-20 MED ORDER — DOCUSATE SODIUM 100 MG PO CAPS
200.0000 mg | ORAL_CAPSULE | Freq: Every day | ORAL | Status: DC
  Administered 2021-01-21 – 2021-01-23 (×3): 200 mg via ORAL
  Filled 2021-01-20 (×5): qty 2

## 2021-01-20 MED ORDER — 0.9 % SODIUM CHLORIDE (POUR BTL) OPTIME
TOPICAL | Status: DC | PRN
  Administered 2021-01-20 (×2): 1000 mL

## 2021-01-20 MED ORDER — FAMOTIDINE IN NACL 20-0.9 MG/50ML-% IV SOLN
20.0000 mg | Freq: Two times a day (BID) | INTRAVENOUS | Status: DC
  Administered 2021-01-20: 18:00:00 20 mg via INTRAVENOUS

## 2021-01-20 MED ORDER — SODIUM CHLORIDE 0.9 % IV SOLN: 250.0000 mL | INTRAVENOUS | Status: DC

## 2021-01-20 MED ORDER — LACTATED RINGERS IV SOLN: INTRAVENOUS | Status: DC

## 2021-01-20 MED ORDER — NITROGLYCERIN IN D5W 200-5 MCG/ML-% IV SOLN: 0.0000 ug/min | INTRAVENOUS | Status: DC

## 2021-01-20 MED ORDER — MIDAZOLAM HCL 5 MG/5ML IJ SOLN
INTRAMUSCULAR | Status: DC | PRN
  Administered 2021-01-20: 2 mg via INTRAVENOUS
  Administered 2021-01-20 (×3): 1 mg via INTRAVENOUS
  Administered 2021-01-20: 3 mg via INTRAVENOUS

## 2021-01-20 MED ORDER — CHLORHEXIDINE GLUCONATE CLOTH 2 % EX PADS
6.0000 | MEDICATED_PAD | Freq: Every day | CUTANEOUS | Status: DC
  Administered 2021-01-20 – 2021-01-23 (×4): 6 via TOPICAL

## 2021-01-20 MED ORDER — BISACODYL 5 MG PO TBEC
10.0000 mg | DELAYED_RELEASE_TABLET | Freq: Every day | ORAL | Status: DC
  Administered 2021-01-21 – 2021-01-23 (×3): 10 mg via ORAL
  Filled 2021-01-20 (×4): qty 2

## 2021-01-20 MED ORDER — ACETAMINOPHEN 160 MG/5ML PO SOLN: 1000.0000 mg | Freq: Four times a day (QID) | ORAL | Status: DC

## 2021-01-20 MED ORDER — SODIUM CHLORIDE 0.9% FLUSH
3.0000 mL | Freq: Two times a day (BID) | INTRAVENOUS | Status: DC
  Administered 2021-01-21 – 2021-01-24 (×6): 3 mL via INTRAVENOUS

## 2021-01-20 MED ORDER — HEPARIN SODIUM (PORCINE) 1000 UNIT/ML IJ SOLN
INTRAMUSCULAR | Status: AC
  Filled 2021-01-20: qty 1

## 2021-01-20 MED ORDER — AMIODARONE HCL IN DEXTROSE 360-4.14 MG/200ML-% IV SOLN: 30.0000 mg/h | INTRAVENOUS | Status: DC

## 2021-01-20 MED ORDER — PHENYLEPHRINE HCL-NACL 20-0.9 MG/250ML-% IV SOLN
0.0000 ug/min | INTRAVENOUS | Status: DC
  Administered 2021-01-20: 23:00:00 80 ug/min via INTRAVENOUS
  Filled 2021-01-20: qty 250

## 2021-01-20 MED ORDER — TRAMADOL HCL 50 MG PO TABS
50.0000 mg | ORAL_TABLET | ORAL | Status: DC | PRN
  Administered 2021-01-21: 100 mg via ORAL
  Administered 2021-01-21: 50 mg via ORAL
  Administered 2021-01-21 (×2): 100 mg via ORAL
  Administered 2021-01-22: 50 mg via ORAL
  Administered 2021-01-22: 100 mg via ORAL
  Filled 2021-01-20: qty 2
  Filled 2021-01-20: qty 1
  Filled 2021-01-20 (×2): qty 2
  Filled 2021-01-20: qty 1
  Filled 2021-01-20: qty 2

## 2021-01-20 MED ORDER — CHLORHEXIDINE GLUCONATE 0.12 % MT SOLN
15.0000 mL | Freq: Once | OROMUCOSAL | Status: AC
  Administered 2021-01-20: 09:00:00 15 mL via OROMUCOSAL
  Filled 2021-01-20: qty 15

## 2021-01-20 MED ORDER — DOBUTAMINE IN D5W 4-5 MG/ML-% IV SOLN
2.0000 ug/kg/min | INTRAVENOUS | Status: DC
  Filled 2021-01-20: qty 250

## 2021-01-20 MED ORDER — LIDOCAINE 2% (20 MG/ML) 5 ML SYRINGE
INTRAMUSCULAR | Status: DC | PRN
  Administered 2021-01-20: 100 mg via INTRAVENOUS

## 2021-01-20 MED ORDER — OXYCODONE HCL 5 MG PO TABS
5.0000 mg | ORAL_TABLET | ORAL | Status: DC | PRN
  Administered 2021-01-21 (×2): 10 mg via ORAL
  Administered 2021-01-22: 5 mg via ORAL
  Filled 2021-01-20 (×2): qty 2
  Filled 2021-01-20: qty 1

## 2021-01-20 MED ORDER — CHLORHEXIDINE GLUCONATE 4 % EX LIQD: 30.0000 mL | CUTANEOUS | Status: DC

## 2021-01-20 MED ORDER — MIDAZOLAM HCL (PF) 10 MG/2ML IJ SOLN
INTRAMUSCULAR | Status: AC
  Filled 2021-01-20: qty 2

## 2021-01-20 MED ORDER — SODIUM CHLORIDE 0.9 % IV SOLN: INTRAVENOUS | Status: DC

## 2021-01-20 MED ORDER — FAMOTIDINE IN NACL 20-0.9 MG/50ML-% IV SOLN
20.0000 mg | Freq: Two times a day (BID) | INTRAVENOUS | Status: DC
  Filled 2021-01-20: qty 50

## 2021-01-20 MED ORDER — LEVOFLOXACIN IN D5W 750 MG/150ML IV SOLN
750.0000 mg | INTRAVENOUS | Status: AC
  Administered 2021-01-21: 750 mg via INTRAVENOUS
  Filled 2021-01-20: qty 150

## 2021-01-20 MED ORDER — ACETAMINOPHEN 500 MG PO TABS
1000.0000 mg | ORAL_TABLET | Freq: Four times a day (QID) | ORAL | Status: DC
  Administered 2021-01-21 – 2021-01-24 (×15): 1000 mg via ORAL
  Filled 2021-01-20 (×15): qty 2

## 2021-01-20 MED ORDER — MIDAZOLAM HCL 2 MG/2ML IJ SOLN: 2.0000 mg | INTRAMUSCULAR | Status: DC | PRN

## 2021-01-20 MED ORDER — POTASSIUM CHLORIDE 10 MEQ/50ML IV SOLN
10.0000 meq | INTRAVENOUS | Status: AC
  Administered 2021-01-20 (×3): 10 meq via INTRAVENOUS

## 2021-01-20 MED ORDER — AMIODARONE HCL IN DEXTROSE 360-4.14 MG/200ML-% IV SOLN
60.0000 mg/h | INTRAVENOUS | Status: AC
  Administered 2021-01-20: 21:00:00 60 mg/h via INTRAVENOUS
  Filled 2021-01-20: qty 400

## 2021-01-20 MED ORDER — LACTATED RINGERS IV SOLN: INTRAVENOUS | Status: DC | PRN

## 2021-01-20 MED ORDER — INSULIN REGULAR(HUMAN) IN NACL 100-0.9 UT/100ML-% IV SOLN: INTRAVENOUS | Status: DC

## 2021-01-20 MED ORDER — ALBUMIN HUMAN 5 % IV SOLN
250.0000 mL | INTRAVENOUS | Status: AC | PRN
  Administered 2021-01-20 (×3): 12.5 g via INTRAVENOUS
  Filled 2021-01-20: qty 250

## 2021-01-20 MED ORDER — MANNITOL 20 % IV SOLN
INTRAVENOUS | Status: DC
  Filled 2021-01-20 (×2): qty 13

## 2021-01-20 MED ORDER — ROCURONIUM BROMIDE 10 MG/ML (PF) SYRINGE
PREFILLED_SYRINGE | INTRAVENOUS | Status: DC | PRN
  Administered 2021-01-20 (×2): 100 mg via INTRAVENOUS

## 2021-01-20 MED ORDER — ACETAMINOPHEN 650 MG RE SUPP
650.0000 mg | Freq: Once | RECTAL | Status: AC
  Administered 2021-01-20: 18:00:00 650 mg via RECTAL

## 2021-01-20 MED ORDER — METOPROLOL TARTRATE 12.5 MG HALF TABLET
12.5000 mg | ORAL_TABLET | Freq: Two times a day (BID) | ORAL | Status: DC
  Administered 2021-01-21: 12.5 mg via ORAL
  Filled 2021-01-20: qty 1

## 2021-01-20 MED ORDER — METOPROLOL TARTRATE 5 MG/5ML IV SOLN: 2.5000 mg | INTRAVENOUS | Status: DC | PRN

## 2021-01-20 MED ORDER — PROPOFOL 10 MG/ML IV BOLUS
INTRAVENOUS | Status: DC | PRN
  Administered 2021-01-20: 50 mg via INTRAVENOUS

## 2021-01-20 MED ORDER — ACETAMINOPHEN 160 MG/5ML PO SOLN: 650.0000 mg | Freq: Once | ORAL | Status: AC

## 2021-01-20 MED ORDER — ~~LOC~~ CARDIAC SURGERY, PATIENT & FAMILY EDUCATION
Freq: Once | Status: DC
  Filled 2021-01-20: qty 1

## 2021-01-20 MED ORDER — PROPOFOL 10 MG/ML IV BOLUS
INTRAVENOUS | Status: AC
  Filled 2021-01-20: qty 40

## 2021-01-20 MED ORDER — PLASMA-LYTE A IV SOLN
INTRAVENOUS | Status: DC | PRN
  Administered 2021-01-20: 14:00:00 1000 mL via INTRAVASCULAR

## 2021-01-20 MED ORDER — MAGNESIUM SULFATE 4 GM/100ML IV SOLN
4.0000 g | Freq: Once | INTRAVENOUS | Status: AC
  Administered 2021-01-20: 18:00:00 4 g via INTRAVENOUS
  Filled 2021-01-20: qty 100

## 2021-01-20 MED ORDER — NOREPINEPHRINE 4 MG/250ML-% IV SOLN: 0.0000 ug/min | INTRAVENOUS | Status: DC

## 2021-01-20 MED ORDER — AMIODARONE HCL IN DEXTROSE 360-4.14 MG/200ML-% IV SOLN
INTRAVENOUS | Status: DC | PRN
  Administered 2021-01-20: 60 mg/h via INTRAVENOUS

## 2021-01-20 MED ORDER — LIDOCAINE 2% (20 MG/ML) 5 ML SYRINGE
INTRAMUSCULAR | Status: AC
  Filled 2021-01-20: qty 10

## 2021-01-20 MED ORDER — PROTAMINE SULFATE 10 MG/ML IV SOLN
INTRAVENOUS | Status: DC | PRN
  Administered 2021-01-20: 25 mg via INTRAVENOUS
  Administered 2021-01-20: 10 mg via INTRAVENOUS
  Administered 2021-01-20: 25 mg via INTRAVENOUS
  Administered 2021-01-20: 10 mg via INTRAVENOUS
  Administered 2021-01-20 (×2): 25 mg via INTRAVENOUS
  Administered 2021-01-20: 15 mg via INTRAVENOUS

## 2021-01-20 MED ORDER — PANTOPRAZOLE SODIUM 40 MG PO TBEC
40.0000 mg | DELAYED_RELEASE_TABLET | Freq: Every day | ORAL | Status: DC
  Administered 2021-01-22 – 2021-01-25 (×4): 40 mg via ORAL
  Filled 2021-01-20 (×4): qty 1

## 2021-01-20 MED ORDER — SODIUM CHLORIDE 0.45 % IV SOLN: INTRAVENOUS | Status: DC | PRN

## 2021-01-20 MED ORDER — CHLORHEXIDINE GLUCONATE 0.12% ORAL RINSE (MEDLINE KIT)
15.0000 mL | Freq: Two times a day (BID) | OROMUCOSAL | Status: DC
  Administered 2021-01-21: 15 mL via OROMUCOSAL

## 2021-01-20 MED ORDER — CHLORHEXIDINE GLUCONATE 0.12 % MT SOLN
15.0000 mL | OROMUCOSAL | Status: AC
  Administered 2021-01-20: 17:00:00 15 mL via OROMUCOSAL

## 2021-01-20 MED ORDER — HEPARIN SODIUM (PORCINE) 1000 UNIT/ML IJ SOLN
INTRAMUSCULAR | Status: DC | PRN
  Administered 2021-01-20: 32000 [IU] via INTRAVENOUS

## 2021-01-20 MED ORDER — ONDANSETRON HCL 4 MG/2ML IJ SOLN
4.0000 mg | Freq: Four times a day (QID) | INTRAMUSCULAR | Status: DC | PRN
  Administered 2021-01-21 – 2021-01-24 (×5): 4 mg via INTRAVENOUS
  Filled 2021-01-20 (×6): qty 2

## 2021-01-20 MED ORDER — ALBUMIN HUMAN 5 % IV SOLN: INTRAVENOUS | Status: DC | PRN

## 2021-01-20 MED ORDER — SODIUM CHLORIDE 0.9% FLUSH: 10.0000 mL | INTRAVENOUS | Status: DC | PRN

## 2021-01-20 MED ORDER — ASPIRIN 81 MG PO CHEW
324.0000 mg | CHEWABLE_TABLET | Freq: Every day | ORAL | Status: DC
  Filled 2021-01-20: qty 4

## 2021-01-20 MED ORDER — BISACODYL 10 MG RE SUPP: 10.0000 mg | Freq: Every day | RECTAL | Status: DC

## 2021-01-20 MED ORDER — DEXMEDETOMIDINE HCL IN NACL 400 MCG/100ML IV SOLN
0.0000 ug/kg/h | INTRAVENOUS | Status: DC
  Administered 2021-01-20: 19:00:00 0.2 ug/kg/h via INTRAVENOUS
  Filled 2021-01-20: qty 100

## 2021-01-20 MED ORDER — PHENYLEPHRINE 40 MCG/ML (10ML) SYRINGE FOR IV PUSH (FOR BLOOD PRESSURE SUPPORT)
PREFILLED_SYRINGE | INTRAVENOUS | Status: AC
  Filled 2021-01-20: qty 10

## 2021-01-20 MED ORDER — METOPROLOL TARTRATE 25 MG/10 ML ORAL SUSPENSION: 12.5000 mg | Freq: Two times a day (BID) | ORAL | Status: DC

## 2021-01-20 MED ORDER — SODIUM CHLORIDE 0.9% FLUSH
10.0000 mL | Freq: Two times a day (BID) | INTRAVENOUS | Status: DC
  Administered 2021-01-20 – 2021-01-21 (×2): 10 mL
  Administered 2021-01-21: 40 mL
  Administered 2021-01-22: 10 mL
  Administered 2021-01-22: 20 mL
  Administered 2021-01-23 (×2): 10 mL

## 2021-01-20 MED ORDER — HEMOSTATIC AGENTS (NO CHARGE) OPTIME
TOPICAL | Status: DC | PRN
  Administered 2021-01-20 (×2): 1 via TOPICAL

## 2021-01-20 SURGICAL SUPPLY — 84 items
BAG DECANTER FOR FLEXI CONT (MISCELLANEOUS) ×5 IMPLANT
BLADE CLIPPER SURG (BLADE) ×5 IMPLANT
BLADE STERNUM SYSTEM 6 (BLADE) ×5 IMPLANT
BNDG ELASTIC 4X5.8 VLCR STR LF (GAUZE/BANDAGES/DRESSINGS) ×5 IMPLANT
BNDG ELASTIC 6X10 VLCR STRL LF (GAUZE/BANDAGES/DRESSINGS) ×5 IMPLANT
BNDG ELASTIC 6X5.8 VLCR STR LF (GAUZE/BANDAGES/DRESSINGS) ×5 IMPLANT
BNDG GAUZE ELAST 4 BULKY (GAUZE/BANDAGES/DRESSINGS) ×5 IMPLANT
CABLE SURGICAL S-101-97-12 (CABLE) ×5 IMPLANT
CANISTER SUCT 3000ML PPV (MISCELLANEOUS) ×5 IMPLANT
CANNULA MC2 2 STG 29/37 NON-V (CANNULA) ×3 IMPLANT
CANNULA MC2 TWO STAGE (CANNULA) ×2
CANNULA NON VENT 20FR 12 (CANNULA) ×5 IMPLANT
CATH ROBINSON RED A/P 18FR (CATHETERS) ×10 IMPLANT
CLIP RETRACTION 3.0MM CORONARY (MISCELLANEOUS) ×5 IMPLANT
CLIP VESOCCLUDE MED 24/CT (CLIP) IMPLANT
CLIP VESOCCLUDE SM WIDE 24/CT (CLIP) IMPLANT
CONN ST 1/2X1/2  BEN (MISCELLANEOUS) ×2
CONN ST 1/2X1/2 BEN (MISCELLANEOUS) ×3 IMPLANT
CONNECTOR BLAKE 2:1 CARIO BLK (MISCELLANEOUS) ×5 IMPLANT
CONTAINER PROTECT SURGISLUSH (MISCELLANEOUS) ×10 IMPLANT
DRAIN CHANNEL 19F RND (DRAIN) ×10 IMPLANT
DRAIN CONNECTOR BLAKE 1:1 (MISCELLANEOUS) ×5 IMPLANT
DRAPE CARDIOVASCULAR INCISE (DRAPES) ×2
DRAPE INCISE IOBAN 66X45 STRL (DRAPES) ×5 IMPLANT
DRAPE SRG 135X102X78XABS (DRAPES) ×3 IMPLANT
DRAPE WARM FLUID 44X44 (DRAPES) ×5 IMPLANT
DRSG AQUACEL AG ADV 3.5X10 (GAUZE/BANDAGES/DRESSINGS) ×5 IMPLANT
DRSG COVADERM 4X14 (GAUZE/BANDAGES/DRESSINGS) ×5 IMPLANT
ELECT BLADE 4.0 EZ CLEAN MEGAD (MISCELLANEOUS) ×5
ELECT REM PT RETURN 9FT ADLT (ELECTROSURGICAL) ×10
ELECTRODE BLDE 4.0 EZ CLN MEGD (MISCELLANEOUS) ×3 IMPLANT
ELECTRODE REM PT RTRN 9FT ADLT (ELECTROSURGICAL) ×6 IMPLANT
FELT TEFLON 1X6 (MISCELLANEOUS) ×10 IMPLANT
GAUZE 4X4 16PLY ~~LOC~~+RFID DBL (SPONGE) ×5 IMPLANT
GAUZE SPONGE 4X4 12PLY STRL (GAUZE/BANDAGES/DRESSINGS) ×10 IMPLANT
GLOVE SURG ENC MOIS LTX SZ7 (GLOVE) ×10 IMPLANT
GLOVE SURG ENC TEXT LTX SZ7.5 (GLOVE) ×10 IMPLANT
GLOVE SURG UNDER POLY LF SZ7.5 (GLOVE) ×10 IMPLANT
GOWN STRL REUS W/ TWL LRG LVL3 (GOWN DISPOSABLE) ×18 IMPLANT
GOWN STRL REUS W/ TWL XL LVL3 (GOWN DISPOSABLE) ×9 IMPLANT
GOWN STRL REUS W/TWL LRG LVL3 (GOWN DISPOSABLE) ×12
GOWN STRL REUS W/TWL XL LVL3 (GOWN DISPOSABLE) ×6
HEMOSTAT POWDER SURGIFOAM 1G (HEMOSTASIS) ×10 IMPLANT
INSERT FOGARTY XLG (MISCELLANEOUS) ×5 IMPLANT
INSERT SUTURE HOLDER (MISCELLANEOUS) ×5 IMPLANT
KIT BASIN OR (CUSTOM PROCEDURE TRAY) ×5 IMPLANT
KIT SUCTION CATH 14FR (SUCTIONS) ×5 IMPLANT
KIT TURNOVER KIT B (KITS) ×5 IMPLANT
KIT VASOVIEW HEMOPRO 2 VH 4000 (KITS) ×5 IMPLANT
LEAD PACING MYOCARDI (MISCELLANEOUS) ×5 IMPLANT
MARKER GRAFT CORONARY BYPASS (MISCELLANEOUS) ×15 IMPLANT
MARKER SKIN DUAL TIP RULER LAB (MISCELLANEOUS) ×5 IMPLANT
NS IRRIG 1000ML POUR BTL (IV SOLUTION) ×25 IMPLANT
PACK ACCESSORY CANNULA KIT (KITS) ×5 IMPLANT
PACK E OPEN HEART (SUTURE) ×5 IMPLANT
PACK OPEN HEART (CUSTOM PROCEDURE TRAY) ×5 IMPLANT
PAD ARMBOARD 7.5X6 YLW CONV (MISCELLANEOUS) ×10 IMPLANT
PAD ELECT DEFIB RADIOL ZOLL (MISCELLANEOUS) ×5 IMPLANT
PENCIL BUTTON HOLSTER BLD 10FT (ELECTRODE) ×5 IMPLANT
POSITIONER HEAD DONUT 9IN (MISCELLANEOUS) ×5 IMPLANT
PUNCH AORTIC ROTATE 4.0MM (MISCELLANEOUS) ×5 IMPLANT
SET MPS 3-ND DEL (MISCELLANEOUS) ×5 IMPLANT
SPONGE T-LAP 18X18 ~~LOC~~+RFID (SPONGE) ×20 IMPLANT
SUPPORT HEART JANKE-BARRON (MISCELLANEOUS) ×5 IMPLANT
SUT BONE WAX W31G (SUTURE) ×5 IMPLANT
SUT ETHIBOND X763 2 0 SH 1 (SUTURE) ×10 IMPLANT
SUT MNCRL AB 3-0 PS2 18 (SUTURE) ×15 IMPLANT
SUT PDS AB 1 CTX 36 (SUTURE) ×10 IMPLANT
SUT PROLENE 4 0 SH DA (SUTURE) ×20 IMPLANT
SUT PROLENE 5 0 C 1 36 (SUTURE) ×15 IMPLANT
SUT PROLENE 7 0 BV 1 (SUTURE) ×15 IMPLANT
SUT PROLENE 7 0 BV1 MDA (SUTURE) ×10 IMPLANT
SUT STEEL 6MS V (SUTURE) ×10 IMPLANT
SUT STEEL STERNAL CCS#1 18IN (SUTURE) ×15 IMPLANT
SUT VIC AB 2-0 CT1 27 (SUTURE) ×4
SUT VIC AB 2-0 CT1 TAPERPNT 27 (SUTURE) ×6 IMPLANT
SYSTEM SAHARA CHEST DRAIN ATS (WOUND CARE) ×5 IMPLANT
TAPE CLOTH SURG 4X10 WHT LF (GAUZE/BANDAGES/DRESSINGS) ×5 IMPLANT
TOWEL GREEN STERILE (TOWEL DISPOSABLE) ×5 IMPLANT
TOWEL GREEN STERILE FF (TOWEL DISPOSABLE) ×5 IMPLANT
TRAY FOLEY SLVR 16FR TEMP STAT (SET/KITS/TRAYS/PACK) ×5 IMPLANT
TUBING LAP HI FLOW INSUFFLATIO (TUBING) ×5 IMPLANT
UNDERPAD 30X36 HEAVY ABSORB (UNDERPADS AND DIAPERS) ×5 IMPLANT
WATER STERILE IRR 1000ML POUR (IV SOLUTION) ×10 IMPLANT

## 2021-01-20 NOTE — Progress Notes (Signed)
      [address]       [place] [address]             [phone]      S/p CABG x 3  Intubated, sedated  BP 132/71   Pulse 86   Temp 97.7 F (36.5 C)   Resp 12   Ht 5\' 10"  (1.778 m)   Wt 95.3 kg   SpO2 99%   BMI 30.13 kg/m  CI = 2.5  Intake/Output Summary (Last 24 hours) at 01/20/2021 1928 Last data filed at 01/20/2021 1900 Gross per 24 hour  Intake 3488.2 ml  Output 2588 ml  Net 900.2 ml   Hct 32, PLT 160K K= 4.2  Stable early postop  Kaydon Husby C. Roxan Hockey, MD [facility] [phone]

## 2021-01-20 NOTE — Op Note (Signed)
[address]       [place] [address]             [phone]                                          01/20/2021 Patient:  Hector Morris Pre-Op Dx: Coronary artery disease   Congestive heart failure   Hypertension Post-op Dx: Same Procedure: CABG X 3.  LIMA to LAD.  Reverse saphenous vein graft to PDA, and obtuse marginal Endoscopic greater saphenous vein harvest on the right   Surgeon and Role:      * Lorain Keast, Lucile Crater, MD - Primary    Evonnie Pat, PA-C- assisting An experienced assistant was required given the complexity of this surgery and the standard of surgical care. The assistant was needed for exposure, dissection, suctioning, retraction of delicate tissues and sutures, instrument exchange and for overall help during this procedure.    Anesthesia  general EBL: 500 ml Blood Administration: None Xclamp Time: 62 min Pump Time: 102 min  Drains: 19 F blake drain: L, mediastinal  Wires: Ventricular Counts: correct   Indications: 75 year old male presents with severe three-vessel coronary artery disease.  I personally reviewed his left heart cath.  He has good target on his LAD as well as obtuse marginal.  The RCA is completely occluded but the PDA fills from left to right collaterals.  Good candidate for CABG x3.  The patient does not have an echocardiogram this will need to be done prior to his clinic appointment.  His EKG shows a normal sinus rhythm.  His most recent CT scan in November this year shows an ascending aortic aneurysm of 3.9 cm.  Findings: Good vein, good LIMA.  Heavily calcified PDA, obtuse marginal, and LAD.  The patient did develop atrial fibrillation at the completion of the case.  He required cardioversion and initiation of an amiodarone drip.  Operative Technique: All invasive lines were placed in pre-op holding.  After the risks, benefits and alternatives were thoroughly discussed, the patient was brought to the operative  theatre.  Anesthesia was induced, and the patient was prepped and draped in normal sterile fashion.  An appropriate surgical pause was performed, and pre-operative antibiotics were dosed accordingly.  We began with simultaneous incisions along the right leg for harvesting of the greater saphenous vein and the chest for the sternotomy.  In regards to the sternotomy, this was carried down with bovie cautery, and the sternum was divided with a reciprocating saw.  Meticulous hemostasis was obtained.  The left internal thoracic artery was exposed and harvested in in pedicled fashion.  The patient was systemically heparinized, and the artery was divided distally, and placed in a papaverine sponge.    The sternal elevator was removed, and a retractor was placed.  The pericardium was divided in the midline and fashioned into a cradle with pericardial stitches.   After we confirmed an appropriate ACT, the ascending aorta was cannulated in standard fashion.  The right atrial appendage was used for venous cannulation site.  Cardiopulmonary bypass was initiated, and the heart retractor was placed. The cross clamp was applied, and a dose of anterograde cardioplegia was given with good arrest of the heart.  We moved to the posterior wall of the heart, and found a good target on the PDA.  An arteriotomy was  made, and the vein graft was anastomosed to it in an end to side fashion.  Next we exposed the lateral wall, and found a good target on the OM1.  An end to side anastomosis with the vein graft was then created.  Finally, we exposed a good target on the LAD, and fashioned an end to side anastomosis between it and the LITA.  We began to re-warm, and a re-animation dose of cardioplegia was given.  The heart was de-aired, and the cross clamp was removed.  Meticulous hemostasis was obtained.    A partial occludding clamp was then placed on the ascending aorta, and we created an end to side anastomosis between it and the  proximal vein grafts.  Rings were placed on the proximal anastomosis.  Hemostasis was obtained, and we separated from cardiopulmonary bypass without event.  The heparin was reversed with protamine.  Chest tubes and wires were placed, and the sternum was re-approximated with sternal wires.  The soft tissue and skin were re-approximated wth absorbable suture.    The patient tolerated the procedure without any immediate complications, and was transferred to the ICU in guarded condition.  Hector Morris

## 2021-01-20 NOTE — Interval H&P Note (Signed)
History and Physical Interval Note:  01/20/2021 11:10 AM  Hector Morris  has presented today for surgery, with the diagnosis of CAD.  The various methods of treatment have been discussed with the patient and family. After consideration of risks, benefits and other options for treatment, the patient has consented to  Procedure(s): CORONARY ARTERY BYPASS GRAFTING (CABG) (N/A) TRANSESOPHAGEAL ECHOCARDIOGRAM (TEE) (N/A) as a surgical intervention.  The patient's history has been reviewed, patient examined, no change in status, stable for surgery.  I have reviewed the patient's chart and labs.  Questions were answered to the patient's satisfaction.     Hilary Milks Bary Leriche

## 2021-01-20 NOTE — Transfer of Care (Signed)
Immediate Anesthesia Transfer of Care Note  Patient: Hector Morris  Procedure(s) Performed: CORONARY ARTERY BYPASS GRAFTING (CABG) TIMES THREE USING LEFT INTERNAL MAMMARY ARTERY AND RIGHT GREATER SAPHENOUS VEIN HARVESTED ENDOSCOPICALLY. (Chest) TRANSESOPHAGEAL ECHOCARDIOGRAM (TEE) (Esophagus) APPLICATION OF CELL SAVER (Chest) ENDOVEIN HARVEST OF GREATER SAPHENOUS VEIN (Right: Leg Upper)  Patient Location: ICU  Anesthesia Type:General  Level of Consciousness: sedated, unresponsive and Patient remains intubated per anesthesia plan  Airway & Oxygen Therapy: Patient remains intubated per anesthesia plan and Patient placed on Ventilator (see vital sign flow sheet for setting)  Post-op Assessment: Report given to RN and Post -op Vital signs reviewed and stable  Post vital signs: Reviewed and stable  Last Vitals:  Vitals Value Taken Time  BP 139/76   Temp 36.4 C 01/20/21 1725  Pulse 109 01/20/21 1725  Resp 11 01/20/21 1725  SpO2 94 % 01/20/21 1725  Vitals shown include unvalidated device data.  Last Pain:  Vitals:   01/20/21 0841  TempSrc:   PainSc: 0-No pain         Complications: No notable events documented.

## 2021-01-20 NOTE — Brief Op Note (Signed)
01/20/2021  8:51 AM  PATIENT:  Hector Morris  75 y.o. male  PRE-OPERATIVE DIAGNOSIS:  Coronary Artery Disease  POST-OPERATIVE DIAGNOSIS:  Coronary Artery Disease  PROCEDURE:  Procedure(s): CORONARY ARTERY BYPASS GRAFTING (CABG) TIMES THREE USING LEFT INTERNAL MAMMARY ARTERY AND RIGHT GREATER SAPHENOUS VEIN HARVESTED ENDOSCOPICALLY. (N/A) TRANSESOPHAGEAL ECHOCARDIOGRAM (TEE) (N/A) APPLICATION OF CELL SAVER (N/A) ENDOVEIN HARVEST OF GREATER SAPHENOUS VEIN (Right) LIMA-LAD SVG-OM SVG-PD EVH RIGHT LEG 55 MINUTES  SURGEON:  Surgeon(s) and Role:    * Lightfoot, Lucile Crater, MD - Primary  PHYSICIAN ASSISTANT: Bernd Crom PA-C  ASSISTANTS: STAFF   ANESTHESIA:   general  EBL:  443 mL   BLOOD ADMINISTERED:none  DRAINS:  LEFT PLEURAL AND MEDIASTINAL CHEST TUBES    LOCAL MEDICATIONS USED:  NONE  SPECIMEN:  No Specimen  DISPOSITION OF SPECIMEN:  N/A  COUNTS:  YES  TOURNIQUET:  * No tourniquets in log *  DICTATION: .Dragon Dictation  PLAN OF CARE: Admit to inpatient   PATIENT DISPOSITION:  ICU - intubated and hemodynamically stable.   Delay start of Pharmacological VTE agent (>24hrs) due to surgical blood loss or risk of bleeding: yes  COMPLICATIONS: NO KNOWN

## 2021-01-20 NOTE — Anesthesia Procedure Notes (Signed)
Procedure Name: Intubation Date/Time: 01/20/2021 12:29 PM Performed by: Eligha Bridegroom, CRNA Pre-anesthesia Checklist: Patient identified, Emergency Drugs available, Suction available, Patient being monitored and Timeout performed Patient Re-evaluated:Patient Re-evaluated prior to induction Oxygen Delivery Method: Circle system utilized Preoxygenation: Pre-oxygenation with 100% oxygen Induction Type: IV induction Ventilation: Mask ventilation without difficulty and Oral airway inserted - appropriate to patient size Laryngoscope Size: Mac and 4 Grade View: Grade I Tube type: Oral Tube size: 8.0 mm Airway Equipment and Method: Stylet Placement Confirmation: ETT inserted through vocal cords under direct vision, positive ETCO2 and breath sounds checked- equal and bilateral Secured at: 22 cm Tube secured with: Tape Dental Injury: Teeth and Oropharynx as per pre-operative assessment

## 2021-01-20 NOTE — Anesthesia Procedure Notes (Signed)
Central Venous Catheter Insertion Performed by: Catalina Gravel, MD, anesthesiologist Start/End11/28/2022 9:42 AM, 01/20/2021 10:52 AM Patient location: Pre-op. Preanesthetic checklist: patient identified, IV checked, site marked, risks and benefits discussed, surgical consent, monitors and equipment checked, pre-op evaluation, timeout performed and anesthesia consent Position: Trendelenburg Lidocaine 1% used for infiltration and patient sedated Hand hygiene performed , maximum sterile barriers used  and Seldinger technique used Catheter size: 8.5 Fr Central line was placed.Sheath introducer Procedure performed using ultrasound guided technique. Ultrasound Notes:anatomy identified, needle tip was noted to be adjacent to the nerve/plexus identified, no ultrasound evidence of intravascular and/or intraneural injection and image(s) printed for medical record Attempts: 1 Following insertion, line sutured, dressing applied and Biopatch. Post procedure assessment: free fluid flow, blood return through all ports and no air  Patient tolerated the procedure well with no immediate complications.

## 2021-01-20 NOTE — Progress Notes (Signed)
Bilateral hearing aids taken to lobby and given to wife and son.

## 2021-01-20 NOTE — Anesthesia Procedure Notes (Signed)
Arterial Line Insertion Start/End11/28/2022 10:00 AM, 01/20/2021 10:10 AM Performed by: Catalina Gravel, MD, anesthesiologist  Patient location: Pre-op. Preanesthetic checklist: patient identified, IV checked, site marked, risks and benefits discussed, surgical consent, monitors and equipment checked, pre-op evaluation, timeout performed and anesthesia consent Lidocaine 1% used for infiltration Right, radial was placed Catheter size: 20 G Hand hygiene performed  and maximum sterile barriers used   Attempts: 1 Procedure performed using ultrasound guided technique. Ultrasound Notes:anatomy identified, needle tip was noted to be adjacent to the nerve/plexus identified, no ultrasound evidence of intravascular and/or intraneural injection and image(s) printed for medical record Following insertion, dressing applied and Biopatch. Post procedure assessment: normal and unchanged  Post procedure complications: second provider assisted. Patient tolerated the procedure well with no immediate complications.

## 2021-01-20 NOTE — Consult Note (Signed)
NAME:  Hector Morris, MRN:  [id number], DOB:  Mar 24, 1945, LOS: 0 ADMISSION DATE:  01/20/2021, CONSULTATION DATE:  11/28 REFERRING MD:  lightfoot, CHIEF COMPLAINT:  post-op ICU care    History of Present Illness:  75 year old male patient recently diagnosed with severe three-vessel coronary artery disease identified via evaluation for exertional dyspnea.  Referred to cardiothoracic surgery for CABG x3 on 11/28 return to intensive care intubated, and sedated pulmonary asked to assist with postoperative ICU needs  Pertinent  Medical History  Gastroesophageal reflux disease, history of hiatal hernia, history of renal stones, hyperlipidemia, sleep apnea on CPAP, seasonal allergies, anxiety.  Exertional dyspnea with new diagnosis of severe three-vessel coronary artery disease.  History of ascending aortic aneurysm undergoing EVAR placement in 2019  Significant Hospital Events: Including procedures, antibiotic start and stop dates in addition to other pertinent events   11/28 2 OR for elective three-vessel CABG intraoperative course notable for atrial fibrillation, converted to normal sinus after amiodarone  Interim History / Subjective:  Currently sedated  Objective   Blood pressure 132/71, pulse 71, temperature 98.7 F (37.1 C), temperature source Oral, resp. rate 18, height 5\' 10"  (1.778 m), weight 95.3 kg, SpO2 96 %.    Vent Mode: SIMV;PRVC;PSV FiO2 (%):  [50 %] 50 % Set Rate:  [12 bmp] 12 bmp Vt Set:  [580 mL] 580 mL PEEP:  [5 cmH20] 5 cmH20 Pressure Support:  [10 cmH20] 10 cmH20   Intake/Output Summary (Last 24 hours) at 01/20/2021 1802 Last data filed at 01/20/2021 1722 Gross per 24 hour  Intake 3200 ml  Output 1788 ml  Net 1412 ml   Filed Weights   01/20/21 0803  Weight: 95.3 kg    Examination: General: Well-nourished 75 year old white male currently sedated on Precedex HENT: Orally intubated no JVD Lungs: Clear to auscultation bilaterally Cardiovascular: Regular  rate and rhythm Abdomen: Soft not tender Extremities: Warm dry brisk capillary refill right lower extremity is wrapped in Ace bandage Neuro: Sedated GU: Clear yellow from Foley catheter  Resolved Hospital Problem list     Assessment & Plan:  Severe three-vessel coronary artery disease now status post CABG x3 on 11/28 -Current hemodynamic indices look favorable -Weaning phenylephrine Plan Continue wean phenylephrine for MAP greater than 70 and systolic blood pressure greater than 90 Continue telemetry monitoring Postop drain management per cardiothoracic's Continue to monitor hemodynamics  Paroxysmal atrial fibrillation Currently on amiodarone infusion Plan Continue amiodarone Continue telemetry monitoring Deferring anticoagulation  Need for mechanical ventilation status post open heart surgery Plan Weaning Precedex RASS goal 0 Initiate rapid weaning protocol when mental status will support VAP bundle  History of sleep apnea Plan Will need AutoSet CPAP postextubation at at bedtime  H/o hypothyroidism  Plan Synthroid   History of hyperlipidemia Plan  Crestor  Best Practice (right click and "Reselect all SmartList Selections" daily)   Diet/type: NPO w/ meds via tube DVT prophylaxis: other GI prophylaxis: H2B Lines: Central line Foley:  Yes, and it is still needed Code Status:  full code Last date of multidisciplinary goals of care discussion [pending ]  Labs   CBC: Recent Labs  Lab 01/15/21 1145 01/20/21 1238 01/20/21 1514 01/20/21 1516 01/20/21 1541 01/20/21 1544 01/20/21 1622 01/20/21 1626  WBC 8.8  --   --   --   --   --   --   --   HGB 13.6   < > 9.6* 9.5* 9.2* 9.5* 8.8* 9.5*  HCT 40.6   < > 28.2* 28.0* 27.0*  28.0* 26.0* 28.0*  MCV 91.9  --   --   --   --   --   --   --   PLT 236  --  196  --   --   --   --   --    < > = values in this interval not displayed.    Basic Metabolic Panel: Recent Labs  Lab 01/15/21 1145 01/20/21 1238  01/20/21 1251 01/20/21 1352 01/20/21 1419 01/20/21 1450 01/20/21 1453 01/20/21 1516 01/20/21 1541 01/20/21 1544 01/20/21 1622 01/20/21 1626  NA 139 143   < > 141   < > 138   < > 139 139 139 141 141  K 3.9 3.5   < > 3.7   < > 4.7   < > 4.8 4.8 4.8 4.2 4.2  CL 108 104  --  103  --  103  --   --  103  --  104  --   CO2 22  --   --   --   --   --   --   --   --   --   --   --   GLUCOSE 100* 95  --  121*  --  112*  --   --  137*  --  127*  --   BUN 12 14  --  14  --  12  --   --  13  --  12  --   CREATININE 1.28* 1.10  --  1.20  --  1.00  --   --  1.20  --  1.00  --   CALCIUM 9.6  --   --   --   --   --   --   --   --   --   --   --    < > = values in this interval not displayed.   GFR: Estimated Creatinine Clearance: 71.7 mL/min (by C-G formula based on SCr of 1 mg/dL). Recent Labs  Lab 01/15/21 1145  WBC 8.8    Liver Function Tests: Recent Labs  Lab 01/15/21 1145  AST 22  ALT 18  ALKPHOS 50  BILITOT 0.6  PROT 6.6  ALBUMIN 3.5   No results for input(s): LIPASE, AMYLASE in the last 168 hours. No results for input(s): AMMONIA in the last 168 hours.  ABG    Component Value Date/Time   PHART 7.358 01/20/2021 1626   PCO2ART 44.3 01/20/2021 1626   PO2ART 379 (H) 01/20/2021 1626   HCO3 24.9 01/20/2021 1626   TCO2 26 01/20/2021 1626   ACIDBASEDEF 1.0 01/20/2021 1626   O2SAT 100.0 01/20/2021 1626     Coagulation Profile: Recent Labs  Lab 01/15/21 1145  INR 1.1    Cardiac Enzymes: No results for input(s): CKTOTAL, CKMB, CKMBINDEX, TROPONINI in the last 168 hours.  HbA1C: Hgb A1c MFr Bld  Date/Time Value Ref Range Status  01/15/2021 11:45 AM 5.2 4.8 - 5.6 % Final    Comment:    (NOTE) Pre diabetes:          5.7%-6.4%  Diabetes:              >6.4%  Glycemic control for   <7.0% adults with diabetes   05/03/2020 10:52 AM 5.8 4.6 - 6.5 % Final    Comment:    Glycemic Control Guidelines for People with Diabetes:Non Diabetic:  <6%Goal of Therapy:  <7%Additional Action Suggested:  >8%     CBG:  Recent Labs  Lab 01/20/21 1726  GLUCAP 125*    Review of Systems:   Unable   Past Medical History:  He,  has a past medical history of AAA (abdominal aortic aneurysm), Allergy, Anxiety, Arthritis, Coronary artery disease, Depression, Dyspnea, GERD (gastroesophageal reflux disease), History of hiatal hernia, History of kidney stones, colonic polyps (03/31/2011), Hyperlipidemia, Hypothyroidism, Neuromuscular disorder ([place]), Sleep apnea, and Vertigo.   Surgical History:   Past Surgical History:  Procedure Laterality Date   ABDOMINAL AORTIC ENDOVASCULAR STENT GRAFT N/A 04/21/2017   Procedure: ABDOMINAL AORTIC ENDOVASCULAR STENT GRAFT;  Surgeon: Serafina Mitchell, MD;  Location: [facility] OR;  Service: Vascular;  Laterality: N/A;   APPENDECTOMY     CHOLECYSTECTOMY     COLONOSCOPY     last 05/2016   ELBOW SURGERY Left    as teenager   EYE SURGERY Bilateral 05/21/2020   Cataract Surgery   HERNIA REPAIR Right    inguinal   INTRAVASCULAR PRESSURE WIRE/FFR STUDY N/A 01/02/2021   Procedure: INTRAVASCULAR PRESSURE WIRE/FFR STUDY;  Surgeon: Martinique, Mechell Girgis M, MD;  Location: [facility];  Service: Cardiovascular;  Laterality: N/A;   KNEE SURGERY Right    scope - cartilage removed   LEFT HEART CATH AND CORONARY ANGIOGRAPHY N/A 01/02/2021   Procedure: LEFT HEART CATH AND CORONARY ANGIOGRAPHY;  Surgeon: Martinique, Zelda Reames M, MD;  Location: [facility];  Service: Cardiovascular;  Laterality: N/A;   POLYPECTOMY  2018   TONSILLECTOMY       Social History:   reports that he quit smoking about 14 years ago. His smoking use included cigarettes. He has a 58.50 pack-year smoking history. He has quit using smokeless tobacco.  His smokeless tobacco use included chew. He reports current alcohol use. He reports that he does not use drugs.   Family History:  His family history includes Heart attack in his father; Heart murmur in his mother; Obesity in his  brother; Stroke in his sister. There is no history of Colon cancer or Colon polyps.   Allergies Allergies  Allergen Reactions   Bee Venom Anaphylaxis and Swelling   Penicillins Anaphylaxis and Other (See Comments)    "Mushroom" popped up on hip Has patient had a PCN reaction causing immediate rash, facial/tongue/throat swelling, SOB or lightheadedness with hypotension: Yes Has patient had a PCN reaction causing severe rash involving mucus membranes or skin necrosis: Yes Has patient had a PCN reaction that required hospitalization: Yes - In hospital Has patient had a PCN reaction occurring within the last 10 years: No If all of the above answers are "NO", then may proceed with Cephalosporin use.    Shellfish Allergy Hives and Swelling    CRAB MEAT > WHELPS   Latex     UNSPECIFIED REACTION      Home Medications  Prior to Admission medications   Medication Sig Start Date End Date Taking? Authorizing Provider  aspirin 81 MG EC tablet Take 81 mg by mouth at bedtime.   Yes [provider]  carvedilol (COREG) 12.5 MG tablet Take 6.25 mg by mouth 2 (two) times daily with a meal.   Yes [provider]  Cholecalciferol (VITAMIN D3) 125 MCG (5000 UT) CAPS Take 5,000 Units by mouth every morning.   Yes [provider]  Cyanocobalamin (VITAMIN B-12 PO) Take by mouth daily.   Yes [provider]  levothyroxine (SYNTHROID) 25 MCG tablet Take 1 tablet (25 mcg total) by mouth daily before breakfast. Patient taking differently: Take 12.5 mcg  by mouth daily before breakfast. 11/08/19  Yes [name], Junell C, MD  nitroGLYCERIN (NITROSTAT) 0.4 MG SL tablet Place 1 tablet (0.4 mg total) under the tongue every 5 (five) minutes as needed for chest pain. 01/02/21 01/02/22 Yes Martinique, Dario Yono M, MD  omeprazole (PRILOSEC) 20 MG capsule Take 40 mg by mouth daily.   Yes [provider]  PARoxetine (PAXIL) 20 MG tablet Take 10 mg by mouth in the morning and at bedtime.    Yes [provider]  POTASSIUM PO Take 1 tablet by mouth daily. Patient not sue of dosage   Yes [provider]  rosuvastatin (CRESTOR) 40 MG tablet Take 1 tablet (40 mg total) by mouth daily. 03/23/17  Yes Hilty, Nadean Corwin, MD  capsaicin (ZOSTRIX) 0.025 % cream Apply 1 application topically 2 (two) times daily as needed (for feet).    [provider]     Critical care time: 32 min    Erick Colace ACNP-BC [place] Pager # [phone] OR # [phone] if no answer

## 2021-01-21 ENCOUNTER — Inpatient Hospital Stay (HOSPITAL_COMMUNITY): Payer: Medicare Other

## 2021-01-21 ENCOUNTER — Encounter (HOSPITAL_COMMUNITY): Payer: Self-pay | Admitting: Thoracic Surgery (Cardiothoracic Vascular Surgery)

## 2021-01-21 DIAGNOSIS — J9601 Acute respiratory failure with hypoxia: Secondary | ICD-10-CM | POA: Diagnosis not present

## 2021-01-21 DIAGNOSIS — Z951 Presence of aortocoronary bypass graft: Secondary | ICD-10-CM | POA: Diagnosis not present

## 2021-01-21 DIAGNOSIS — R579 Shock, unspecified: Secondary | ICD-10-CM | POA: Diagnosis not present

## 2021-01-21 DIAGNOSIS — R739 Hyperglycemia, unspecified: Secondary | ICD-10-CM

## 2021-01-21 DIAGNOSIS — I480 Paroxysmal atrial fibrillation: Secondary | ICD-10-CM | POA: Diagnosis not present

## 2021-01-21 LAB — CBC
HCT: 30.7 % — ABNORMAL LOW (ref 39.0–52.0)
HCT: 30.4 % — ABNORMAL LOW (ref 39.0–52.0)
Hemoglobin: 10.5 g/dL — ABNORMAL LOW (ref 13.0–17.0)
Hemoglobin: 10.3 g/dL — ABNORMAL LOW (ref 13.0–17.0)
MCH: 31.1 pg (ref 26.0–34.0)
MCH: 30.7 pg (ref 26.0–34.0)
MCHC: 34.2 g/dL (ref 30.0–36.0)
MCHC: 33.9 g/dL (ref 30.0–36.0)
MCV: 90.8 fL (ref 80.0–100.0)
MCV: 90.7 fL (ref 80.0–100.0)
Platelets: 167 10*3/uL (ref 150–400)
Platelets: 167 10*3/uL (ref 150–400)
RBC: 3.38 MIL/uL — ABNORMAL LOW (ref 4.22–5.81)
RBC: 3.35 MIL/uL — ABNORMAL LOW (ref 4.22–5.81)
RDW: 12.9 % (ref 11.5–15.5)
RDW: 13.1 % (ref 11.5–15.5)
WBC: 15.4 10*3/uL — ABNORMAL HIGH (ref 4.0–10.5)
WBC: 18.6 10*3/uL — ABNORMAL HIGH (ref 4.0–10.5)
nRBC: 0 % (ref 0.0–0.2)
nRBC: 0 % (ref 0.0–0.2)

## 2021-01-21 LAB — BASIC METABOLIC PANEL
Anion gap: 6 (ref 5–15)
Anion gap: 6 (ref 5–15)
BUN: 12 mg/dL (ref 8–23)
BUN: 13 mg/dL (ref 8–23)
CO2: 21 mmol/L — ABNORMAL LOW (ref 22–32)
CO2: 21 mmol/L — ABNORMAL LOW (ref 22–32)
Calcium: 8.1 mg/dL — ABNORMAL LOW (ref 8.9–10.3)
Calcium: 8.1 mg/dL — ABNORMAL LOW (ref 8.9–10.3)
Chloride: 111 mmol/L (ref 98–111)
Chloride: 108 mmol/L (ref 98–111)
Creatinine, Ser: 1.19 mg/dL (ref 0.61–1.24)
Creatinine, Ser: 1.24 mg/dL (ref 0.61–1.24)
GFR, Estimated: >60 mL/min (ref >60)
GFR, Estimated: 60 mL/min — ABNORMAL LOW (ref >60)
Glucose, Bld: 139 mg/dL — ABNORMAL HIGH (ref 70–99)
Glucose, Bld: 122 mg/dL — ABNORMAL HIGH (ref 70–99)
Potassium: 4.4 mmol/L (ref 3.5–5.1)
Potassium: 3.9 mmol/L (ref 3.5–5.1)
Sodium: 138 mmol/L (ref 135–145)
Sodium: 135 mmol/L (ref 135–145)

## 2021-01-21 LAB — POCT I-STAT 7, (LYTES, BLD GAS, ICA,H+H)
Acid-base deficit: 3 mmol/L — ABNORMAL HIGH (ref 0.0–2.0)
Acid-base deficit: 7 mmol/L — ABNORMAL HIGH (ref 0.0–2.0)
Acid-base deficit: 7 mmol/L — ABNORMAL HIGH (ref 0.0–2.0)
Bicarbonate: 22.6 mmol/L (ref 20.0–28.0)
Bicarbonate: 18.6 mmol/L — ABNORMAL LOW (ref 20.0–28.0)
Bicarbonate: 18.7 mmol/L — ABNORMAL LOW (ref 20.0–28.0)
Calcium, Ion: 1.26 mmol/L (ref 1.15–1.40)
Calcium, Ion: 1.18 mmol/L (ref 1.15–1.40)
Calcium, Ion: 1.19 mmol/L (ref 1.15–1.40)
HCT: 33 % — ABNORMAL LOW (ref 39.0–52.0)
HCT: 28 % — ABNORMAL LOW (ref 39.0–52.0)
HCT: 29 % — ABNORMAL LOW (ref 39.0–52.0)
Hemoglobin: 11.2 g/dL — ABNORMAL LOW (ref 13.0–17.0)
Hemoglobin: 9.5 g/dL — ABNORMAL LOW (ref 13.0–17.0)
Hemoglobin: 9.9 g/dL — ABNORMAL LOW (ref 13.0–17.0)
O2 Saturation: 93 %
O2 Saturation: 95 %
O2 Saturation: 92 %
Patient temperature: 36.4
Patient temperature: 37.4
Patient temperature: 37.3
Potassium: 3.9 mmol/L (ref 3.5–5.1)
Potassium: 4.1 mmol/L (ref 3.5–5.1)
Potassium: 4.2 mmol/L (ref 3.5–5.1)
Sodium: 143 mmol/L (ref 135–145)
Sodium: 142 mmol/L (ref 135–145)
Sodium: 142 mmol/L (ref 135–145)
TCO2: 24 mmol/L (ref 22–32)
TCO2: 20 mmol/L — ABNORMAL LOW (ref 22–32)
TCO2: 20 mmol/L — ABNORMAL LOW (ref 22–32)
pCO2 arterial: 42.8 mmHg (ref 32.0–48.0)
pCO2 arterial: 37.1 mmHg (ref 32.0–48.0)
pCO2 arterial: 40.4 mmHg (ref 32.0–48.0)
pH, Arterial: 7.327 — ABNORMAL LOW (ref 7.350–7.450)
pH, Arterial: 7.31 — ABNORMAL LOW (ref 7.350–7.450)
pH, Arterial: 7.276 — ABNORMAL LOW (ref 7.350–7.450)
pO2, Arterial: 72 mmHg — ABNORMAL LOW (ref 83.0–108.0)
pO2, Arterial: 81 mmHg — ABNORMAL LOW (ref 83.0–108.0)
pO2, Arterial: 73 mmHg — ABNORMAL LOW (ref 83.0–108.0)

## 2021-01-21 LAB — GLUCOSE, CAPILLARY
Glucose-Capillary: 109 mg/dL — ABNORMAL HIGH (ref 70–99)
Glucose-Capillary: 110 mg/dL — ABNORMAL HIGH (ref 70–99)
Glucose-Capillary: 111 mg/dL — ABNORMAL HIGH (ref 70–99)
Glucose-Capillary: 128 mg/dL — ABNORMAL HIGH (ref 70–99)
Glucose-Capillary: 129 mg/dL — ABNORMAL HIGH (ref 70–99)
Glucose-Capillary: 132 mg/dL — ABNORMAL HIGH (ref 70–99)
Glucose-Capillary: 132 mg/dL — ABNORMAL HIGH (ref 70–99)
Glucose-Capillary: 133 mg/dL — ABNORMAL HIGH (ref 70–99)
Glucose-Capillary: 137 mg/dL — ABNORMAL HIGH (ref 70–99)
Glucose-Capillary: 141 mg/dL — ABNORMAL HIGH (ref 70–99)
Glucose-Capillary: 150 mg/dL — ABNORMAL HIGH (ref 70–99)

## 2021-01-21 LAB — MAGNESIUM
Magnesium: 2.4 mg/dL (ref 1.7–2.4)
Magnesium: 2 mg/dL (ref 1.7–2.4)

## 2021-01-21 LAB — ECHO INTRAOPERATIVE TEE: S' Lateral: 3.5 cm

## 2021-01-21 MED ORDER — INSULIN DETEMIR 100 UNIT/ML ~~LOC~~ SOLN
5.0000 [IU] | Freq: Two times a day (BID) | SUBCUTANEOUS | Status: DC
  Administered 2021-01-21 – 2021-01-23 (×4): 5 [IU] via SUBCUTANEOUS
  Filled 2021-01-21 (×6): qty 0.05

## 2021-01-21 MED ORDER — REVEFENACIN 175 MCG/3ML IN SOLN
175.0000 ug | Freq: Every day | RESPIRATORY_TRACT | Status: DC
  Filled 2021-01-21: qty 3

## 2021-01-21 MED ORDER — AMIODARONE HCL 200 MG PO TABS
ORAL_TABLET | ORAL | Status: AC
  Filled 2021-01-21: qty 1

## 2021-01-21 MED ORDER — AMIODARONE HCL 200 MG PO TABS
200.0000 mg | ORAL_TABLET | Freq: Two times a day (BID) | ORAL | Status: DC
  Administered 2021-01-21 – 2021-01-25 (×9): 200 mg via ORAL
  Filled 2021-01-21 (×7): qty 1

## 2021-01-21 MED ORDER — INSULIN ASPART 100 UNIT/ML IJ SOLN
2.0000 [IU] | INTRAMUSCULAR | Status: DC
Start: 2021-01-21 — End: 2021-01-23
  Administered 2021-01-21 – 2021-01-22 (×3): 2 [IU] via SUBCUTANEOUS

## 2021-01-21 MED ORDER — ENOXAPARIN SODIUM 30 MG/0.3ML IJ SOSY
PREFILLED_SYRINGE | INTRAMUSCULAR | Status: AC
  Administered 2021-01-21: 30 mg via SUBCUTANEOUS
  Filled 2021-01-21: qty 0.3

## 2021-01-21 MED ORDER — ENOXAPARIN SODIUM 30 MG/0.3ML IJ SOSY
30.0000 mg | PREFILLED_SYRINGE | Freq: Every day | INTRAMUSCULAR | Status: DC
  Administered 2021-01-22 – 2021-01-24 (×3): 30 mg via SUBCUTANEOUS
  Filled 2021-01-21 (×3): qty 0.3

## 2021-01-21 MED ORDER — GUAIFENESIN ER 600 MG PO TB12
ORAL_TABLET | ORAL | Status: AC
  Filled 2021-01-21: qty 1

## 2021-01-21 MED ORDER — MIDODRINE HCL 5 MG PO TABS
ORAL_TABLET | ORAL | Status: AC
  Filled 2021-01-21: qty 2

## 2021-01-21 MED ORDER — INSULIN ASPART 100 UNIT/ML IJ SOLN: 0.0000 [IU] | INTRAMUSCULAR | Status: DC

## 2021-01-21 MED ORDER — ARFORMOTEROL TARTRATE 15 MCG/2ML IN NEBU
INHALATION_SOLUTION | RESPIRATORY_TRACT | Status: AC
  Filled 2021-01-21: qty 2

## 2021-01-21 MED ORDER — ORAL CARE MOUTH RINSE
15.0000 mL | Freq: Two times a day (BID) | OROMUCOSAL | Status: DC
  Administered 2021-01-21 – 2021-01-24 (×8): 15 mL via OROMUCOSAL

## 2021-01-21 MED ORDER — GUAIFENESIN ER 600 MG PO TB12
600.0000 mg | ORAL_TABLET | Freq: Two times a day (BID) | ORAL | Status: AC
  Administered 2021-01-21 – 2021-01-22 (×4): 600 mg via ORAL
  Filled 2021-01-21 (×2): qty 1

## 2021-01-21 MED ORDER — ARFORMOTEROL TARTRATE 15 MCG/2ML IN NEBU
INHALATION_SOLUTION | RESPIRATORY_TRACT | Status: AC
  Administered 2021-01-21: 15 ug via RESPIRATORY_TRACT
  Filled 2021-01-21: qty 2

## 2021-01-21 MED ORDER — REVEFENACIN 175 MCG/3ML IN SOLN
175.0000 ug | Freq: Every day | RESPIRATORY_TRACT | Status: DC
Start: 2021-01-21 — End: 2021-01-25
  Administered 2021-01-21 – 2021-01-25 (×5): 175 ug via RESPIRATORY_TRACT
  Filled 2021-01-21 (×5): qty 3

## 2021-01-21 MED ORDER — ARFORMOTEROL TARTRATE 15 MCG/2ML IN NEBU
15.0000 ug | INHALATION_SOLUTION | Freq: Two times a day (BID) | RESPIRATORY_TRACT | Status: DC
  Administered 2021-01-21 – 2021-01-25 (×8): 15 ug via RESPIRATORY_TRACT
  Filled 2021-01-21 (×7): qty 2

## 2021-01-21 MED ORDER — MIDODRINE HCL 5 MG PO TABS
10.0000 mg | ORAL_TABLET | Freq: Three times a day (TID) | ORAL | Status: DC
  Administered 2021-01-21 (×2): 10 mg via ORAL

## 2021-01-21 NOTE — Progress Notes (Signed)
Extubation Procedure Note  Patient Details:   Name: Deavion Dobbs DOB: [birth year] MRN: [id number]   Airway Documentation:    Vent end date: 01/21/21 Vent end time: 0248   Evaluation  O2 sats: stable throughout Complications: No apparent complications Patient did tolerate procedure well. Bilateral Breath Sounds: Clear   Yes  NIF -40cm H2O VC 104ml  Derek Huneycutt Arrie Senate 01/21/2021, 2:49 AM

## 2021-01-21 NOTE — Hospital Course (Addendum)
--  Cardiology and CT surgery follow-up appointments requested on 01/21/2021.  History of Present Illness:     75 year old male referred for artery bypass grafting.  Over the past year he has had some exertional dyspnea and anginal symptoms.  He recently underwent a left heart cath which showed severe three-vessel coronary artery disease.  He also has a history of an ascending aortic aneurysm, and underwent EVAR placement for an abdominal aneurysm in 2019.  Mr. Delman was seen in the office by Dr. Kipp Brood regarding potential coronary bypass grafting.  Preoperative work-up was initiated.  The echocardiogram showed ejection fraction estimate of 45 to 50%.  Mitral valve had mild to moderate regurgitation with no stenosis.  The aortic valve was tricuspid with trivial aortic insufficiency and no aortic valve stenosis.  The ascending aorta was mildly dilated at 44 mm.  The aortic root was 38 mm. Coronary bypass grafting was offered to Mr. Ditullio by Dr. Kipp Brood and the patient elected to proceed.  Course in Hospital: [name] was admitted for elective surgery on 01/20/2021 and taken to the operating room where three-vessel coronary bypass grafting was carried out.  The left internal mammary artery was grafted to the left anterior descending coronary artery.  Saphenous vein grafts were placed to the posterior descending coronary and obtuse marginal coronary arteries.  Following the procedure, he separated from cardiopulmonary bypass without difficulty.  He was transferred to the ICU in stable condition.  The critical care team was consulted for assistance with management postoperatively.  Dobutamine was used to augment hemodynamics early postoperatively.  Mr. Hevener was weaned from the mechanical ventilator and extubated by 3 AM on postop day 1.  Vital signs and hemodynamics remained stable as the dobutamine was weaned.  He was started on midodrine on postop day 1.  He developed atrial fibrillation with RVR in the  early postoperative course that was treated with an IV amiodarone load.  This was transitioned to oral amiodarone on the first postoperative day.Pacer wires were removed on POD # 2.  Patient did develop a mild AKI postoperatively and was able to be diuresed.  Creatinine began to rise again and diuretics have subsequently been discontinued.  He does have an expected acute blood loss anemia.  Values are pretty stable.  Most recent hemoglobin hematocrit dated 01/24/2021 are 9.4 and 28.2 respectively.  Patient is tolerating routine activities using standard cardiac rehab modalities.  Incisions are noted to be healing well without evidence of infection.  He is tolerating diet.  Overall, at the time of discharge the patient is felt to be quite stable.

## 2021-01-21 NOTE — Plan of Care (Signed)
  Problem: Respiratory: Goal: Respiratory status will improve Outcome: Progressing   Problem: Cardiac: Goal: Will achieve and/or maintain hemodynamic stability Outcome: Progressing   Problem: Urinary Elimination: Goal: Ability to achieve and maintain adequate renal perfusion and functioning will improve Outcome: Progressing

## 2021-01-21 NOTE — Anesthesia Postprocedure Evaluation (Signed)
Anesthesia Post Note  Patient: Hector Morris  Procedure(s) Performed: CORONARY ARTERY BYPASS GRAFTING (CABG) TIMES THREE USING LEFT INTERNAL MAMMARY ARTERY AND RIGHT GREATER SAPHENOUS VEIN HARVESTED ENDOSCOPICALLY. (Chest) TRANSESOPHAGEAL ECHOCARDIOGRAM (TEE) (Esophagus) APPLICATION OF CELL SAVER (Chest) ENDOVEIN HARVEST OF GREATER SAPHENOUS VEIN (Right: Leg Upper)     Patient location during evaluation: SICU Anesthesia Type: General Level of consciousness: sedated Pain management: pain level controlled Vital Signs Assessment: vitals unstable Respiratory status: patient remains intubated per anesthesia plan Cardiovascular status: unstable Postop Assessment: no apparent nausea or vomiting Anesthetic complications: no   No notable events documented.  Last Vitals:  Vitals:   01/21/21 1545 01/21/21 1600  BP:  132/62  Pulse: 81 82  Resp: 20 19  Temp:  36.8 C  SpO2: 96% 95%    Last Pain:  Vitals:   01/21/21 1600  TempSrc: Axillary  PainSc: 0-No pain                 Catalina Gravel

## 2021-01-21 NOTE — Progress Notes (Signed)
NAME:  Hector Morris, MRN:  [id number], DOB:  October 11, 1945, LOS: 1 ADMISSION DATE:  01/20/2021, CONSULTATION DATE:  11/28 REFERRING MD:  lightfoot, CHIEF COMPLAINT:  post-op ICU care    History of Present Illness:  75 year old male patient recently diagnosed with severe three-vessel coronary artery disease identified via evaluation for exertional dyspnea.  Referred to cardiothoracic surgery for CABG x3 on 11/28 return to intensive care intubated, and sedated pulmonary asked to assist with postoperative ICU needs  Pertinent  Medical History  Gastroesophageal reflux disease, history of hiatal hernia, history of renal stones, hyperlipidemia, sleep apnea on CPAP, seasonal allergies, anxiety.  Exertional dyspnea with new diagnosis of severe three-vessel coronary artery disease.  History of ascending aortic aneurysm undergoing EVAR placement in 2019  Significant Hospital Events: Including procedures, antibiotic start and stop dates in addition to other pertinent events   11/28 2 OR for elective three-vessel CABG intraoperative course notable for atrial fibrillation, converted to normal sinus after amiodarone 11/29 extubated overnight  Interim History / Subjective:  Complaining of cough and associated musculoskeletal chest pain. Breathing comfortably  Objective   Blood pressure 139/75, pulse 73, temperature 99.3 F (37.4 C), resp. rate 19, height 5\' 10"  (1.778 m), weight 103 kg, SpO2 95 %. CVP:  [4 mmHg-13 mmHg] 8 mmHg  Vent Mode: PSV;CPAP FiO2 (%):  [40 %-50 %] 40 % Set Rate:  [4 bmp-12 bmp] 4 bmp Vt Set:  [580 mL] 580 mL PEEP:  [5 cmH20] 5 cmH20 Pressure Support:  [10 cmH20] 10 cmH20 Plateau Pressure:  [16 cmH20-21 cmH20] 16 cmH20   Intake/Output Summary (Last 24 hours) at 01/21/2021 0729 Last data filed at 01/21/2021 0700 Gross per 24 hour  Intake 5000.37 ml  Output 3403 ml  Net 1597.37 ml    Filed Weights   01/20/21 0803 01/21/21 0430  Weight: 95.3 kg 103 kg     Examination:  General: elderly appearing male resting comfortably in bed. Hard of hearing.  HENT: [place]/AT, PERRL, no JVD. MM dry. Lungs: Clear bilateral breath sounds Cardiovascular: RRR, no MRG.  Abdomen: Soft, non-tender, non-distended Extremities: No acute deformity. No edema.  Neuro: Sleeping, but arouses easily to verbal stimulation.   Resolved Hospital Problem list     Assessment & Plan:  Severe three-vessel coronary artery disease now status post CABG x3 on 11/28 -Current hemodynamic indices look favorable -Weaning phenylephrine Plan Wean dobutamine Midodrine added per CVTS Postop drain management per cardiothoracic's Continue to monitor hemodynamics Oxycodone, prn morphine for chest pain r/t surgery.  IS and flutter valve  Paroxysmal atrial fibrillation: Post op? No AF listed in med history or mentioned in cards outpatient note.  Plan Continue amiodarone > to PO Continue telemetry monitoring Back in sinus this morning. Need to determine if he will need AC. Ongoing tele.   History of sleep apnea Plan CPAP QHS  H/o hypothyroidism  Plan Synthroid   History of hyperlipidemia Plan  Crestor  Best Practice (right click and "Reselect all SmartList Selections" daily)   Diet/type: Regular consistency (see orders) and NPO w/ meds via tube DVT prophylaxis: LMWH GI prophylaxis: H2B Lines: Central line Foley:  Yes, and it is still needed Code Status:  full code Last date of multidisciplinary goals of care discussion [pending ]  Labs   CBC: Recent Labs  Lab 01/15/21 1145 01/20/21 1238 01/20/21 1514 01/20/21 1516 01/20/21 1626 01/20/21 1729 01/21/21 0213 01/21/21 0219 01/21/21 0433  WBC 8.8  --   --   --   --  17.6*  15.4*  --   --   HGB 13.6   < > 9.6*   < > 9.5* 11.2*  11.0* 10.5* 9.5* 9.9*  HCT 40.6   < > 28.2*   < > 28.0* 33.0*  31.8* 30.7* 28.0* 29.0*  MCV 91.9  --   --   --   --  90.1 90.8  --   --   PLT 236  --  196  --   --  160 167  --   --     < > = values in this interval not displayed.     Basic Metabolic Panel: Recent Labs  Lab 01/15/21 1145 01/20/21 1238 01/20/21 1352 01/20/21 1419 01/20/21 1450 01/20/21 1453 01/20/21 1541 01/20/21 1544 01/20/21 1622 01/20/21 1626 01/20/21 1729 01/21/21 0213 01/21/21 0219 01/21/21 0433  NA 139   < > 141   < > 138   < > 139   < > 141 141 143 138 142 142  K 3.9   < > 3.7   < > 4.7   < > 4.8   < > 4.2 4.2 3.9 4.4 4.1 4.2  CL 108   < > 103  --  103  --  103  --  104  --   --  111  --   --   CO2 22  --   --   --   --   --   --   --   --   --   --  21*  --   --   GLUCOSE 100*   < > 121*  --  112*  --  137*  --  127*  --   --  139*  --   --   BUN 12   < > 14  --  12  --  13  --  12  --   --  12  --   --   CREATININE 1.28*   < > 1.20  --  1.00  --  1.20  --  1.00  --   --  1.19  --   --   CALCIUM 9.6  --   --   --   --   --   --   --   --   --   --  8.1*  --   --   MG  --   --   --   --   --   --   --   --   --   --   --  2.4  --   --    < > = values in this interval not displayed.    GFR: Estimated Creatinine Clearance: 62.5 mL/min (by C-G formula based on SCr of 1.19 mg/dL). Recent Labs  Lab 01/15/21 1145 01/20/21 1729 01/21/21 0213  WBC 8.8 17.6* 15.4*     Liver Function Tests: Recent Labs  Lab 01/15/21 1145  AST 22  ALT 18  ALKPHOS 50  BILITOT 0.6  PROT 6.6  ALBUMIN 3.5    No results for input(s): LIPASE, AMYLASE in the last 168 hours. No results for input(s): AMMONIA in the last 168 hours.  ABG    Component Value Date/Time   PHART 7.276 (L) 01/21/2021 0433   PCO2ART 40.4 01/21/2021 0433   PO2ART 73 (L) 01/21/2021 0433   HCO3 18.7 (L) 01/21/2021 0433   TCO2 20 (L) 01/21/2021 0433   ACIDBASEDEF 7.0 (H) 01/21/2021 5176  O2SAT 92.0 01/21/2021 0433      Coagulation Profile: Recent Labs  Lab 01/15/21 1145 01/20/21 1729  INR 1.1 1.4*     Cardiac Enzymes: No results for input(s): CKTOTAL, CKMB, CKMBINDEX, TROPONINI in the last 168  hours.  HbA1C: Hgb A1c MFr Bld  Date/Time Value Ref Range Status  01/15/2021 11:45 AM 5.2 4.8 - 5.6 % Final    Comment:    (NOTE) Pre diabetes:          5.7%-6.4%  Diabetes:              >6.4%  Glycemic control for   <7.0% adults with diabetes   05/03/2020 10:52 AM 5.8 4.6 - 6.5 % Final    Comment:    Glycemic Control Guidelines for People with Diabetes:Non Diabetic:  <6%Goal of Therapy: <7%Additional Action Suggested:  >8%     CBG: Recent Labs  Lab 01/21/21 0112 01/21/21 0218 01/21/21 0319 01/21/21 0432 01/21/21 0613  GLUCAP 128* 129* 137* 132* 132*     Review of Systems:   Unable   Past Medical History:  He,  has a past medical history of AAA (abdominal aortic aneurysm), Allergy, Anxiety, Arthritis, Coronary artery disease, Depression, Dyspnea, GERD (gastroesophageal reflux disease), History of hiatal hernia, History of kidney stones, colonic polyps (03/31/2011), Hyperlipidemia, Hypothyroidism, Neuromuscular disorder ([place]), Sleep apnea, and Vertigo.   Surgical History:   Past Surgical History:  Procedure Laterality Date   ABDOMINAL AORTIC ENDOVASCULAR STENT GRAFT N/A 04/21/2017   Procedure: ABDOMINAL AORTIC ENDOVASCULAR STENT GRAFT;  Surgeon: Serafina Mitchell, MD;  Location: [facility] OR;  Service: Vascular;  Laterality: N/A;   APPENDECTOMY     CHOLECYSTECTOMY     COLONOSCOPY     last 05/2016   ELBOW SURGERY Left    as teenager   EYE SURGERY Bilateral 05/21/2020   Cataract Surgery   HERNIA REPAIR Right    inguinal   INTRAVASCULAR PRESSURE WIRE/FFR STUDY N/A 01/02/2021   Procedure: INTRAVASCULAR PRESSURE WIRE/FFR STUDY;  Surgeon: Martinique, Peter M, MD;  Location: [place] [facility];  Service: Cardiovascular;  Laterality: N/A;   KNEE SURGERY Right    scope - cartilage removed   LEFT HEART CATH AND CORONARY ANGIOGRAPHY N/A 01/02/2021   Procedure: LEFT HEART CATH AND CORONARY ANGIOGRAPHY;  Surgeon: Martinique, Peter M, MD;  Location: [place] [facility];  Service:  Cardiovascular;  Laterality: N/A;   POLYPECTOMY  2018   TONSILLECTOMY       Social History:   reports that he quit smoking about 14 years ago. His smoking use included cigarettes. He has a 58.50 pack-year smoking history. He has quit using smokeless tobacco.  His smokeless tobacco use included chew. He reports current alcohol use. He reports that he does not use drugs.   Family History:  His family history includes Heart attack in his father; Heart murmur in his mother; Obesity in his brother; Stroke in his sister. There is no history of Colon cancer or Colon polyps.   Allergies Allergies  Allergen Reactions   Bee Venom Anaphylaxis and Swelling   Penicillins Anaphylaxis and Other (See Comments)    "Mushroom" popped up on hip Has patient had a PCN reaction causing immediate rash, facial/tongue/throat swelling, SOB or lightheadedness with hypotension: Yes Has patient had a PCN reaction causing severe rash involving mucus membranes or skin necrosis: Yes Has patient had a PCN reaction that required hospitalization: Yes - In hospital Has patient had a PCN reaction occurring within the last 10 years: No  If all of the above answers are "NO", then may proceed with Cephalosporin use.    Shellfish Allergy Hives and Swelling    CRAB MEAT > WHELPS   Latex     UNSPECIFIED REACTION      Home Medications  Prior to Admission medications   Medication Sig Start Date End Date Taking? Authorizing Provider  aspirin 81 MG EC tablet Take 81 mg by mouth at bedtime.   Yes [provider]  carvedilol (COREG) 12.5 MG tablet Take 6.25 mg by mouth 2 (two) times daily with a meal.   Yes [provider]  Cholecalciferol (VITAMIN D3) 125 MCG (5000 UT) CAPS Take 5,000 Units by mouth every morning.   Yes [provider]  Cyanocobalamin (VITAMIN B-12 PO) Take by mouth daily.   Yes [provider]  levothyroxine (SYNTHROID) 25 MCG tablet Take 1 tablet (25 mcg total) by mouth daily  before breakfast. Patient taking differently: Take 12.5 mcg by mouth daily before breakfast. 11/08/19  Yes [name], Junell C, MD  nitroGLYCERIN (NITROSTAT) 0.4 MG SL tablet Place 1 tablet (0.4 mg total) under the tongue every 5 (five) minutes as needed for chest pain. 01/02/21 01/02/22 Yes Martinique, Peter M, MD  omeprazole (PRILOSEC) 20 MG capsule Take 40 mg by mouth daily.   Yes [provider]  PARoxetine (PAXIL) 20 MG tablet Take 10 mg by mouth in the morning and at bedtime.   Yes [provider]  POTASSIUM PO Take 1 tablet by mouth daily. Patient not sue of dosage   Yes [provider]  rosuvastatin (CRESTOR) 40 MG tablet Take 1 tablet (40 mg total) by mouth daily. 03/23/17  Yes Hilty, Nadean Corwin, MD  capsaicin (ZOSTRIX) 0.025 % cream Apply 1 application topically 2 (two) times daily as needed (for feet).    [provider]     Critical care time: 38 minutes     Georgann Housekeeper, AGACNP-BC [place]  See Amion for personal pager PCCM on call pager [phone] until 7pm. Please call Elink 7p-7a. [phone]  01/21/2021 7:38 AM

## 2021-01-21 NOTE — Progress Notes (Signed)
      [address]       [place] [address]             [phone]                 1 Day Post-Op Procedure(s) (LRB): CORONARY ARTERY BYPASS GRAFTING (CABG) TIMES THREE USING LEFT INTERNAL MAMMARY ARTERY AND RIGHT GREATER SAPHENOUS VEIN HARVESTED ENDOSCOPICALLY. (N/A) TRANSESOPHAGEAL ECHOCARDIOGRAM (TEE) (N/A) APPLICATION OF CELL SAVER (N/A) ENDOVEIN HARVEST OF GREATER SAPHENOUS VEIN (Right)   Events: No events Extubated overnight _______________________________________________________________ Vitals: BP 139/75   Pulse 73   Temp 99.3 F (37.4 C)   Resp 19   Ht 5\' 10"  (1.778 m)   Wt 103 kg   SpO2 95%   BMI 32.58 kg/m  Filed Weights   01/20/21 0803 01/21/21 0430  Weight: 95.3 kg 103 kg     - Neuro: alert NAD  - Cardiovascular: sinus  Drips: dob 2.   CVP:  [4 mmHg-13 mmHg] 8 mmHg  - Pulm: coarse BS   ABG    Component Value Date/Time   PHART 7.276 (L) 01/21/2021 0433   PCO2ART 40.4 01/21/2021 0433   PO2ART 73 (L) 01/21/2021 0433   HCO3 18.7 (L) 01/21/2021 0433   TCO2 20 (L) 01/21/2021 0433   ACIDBASEDEF 7.0 (H) 01/21/2021 0433   O2SAT 92.0 01/21/2021 0433    - Abd: ND - Extremity: warm  .Intake/Output      11/28 0701 11/29 0700 11/29 0701 11/30 0700   I.V. (mL/kg) 3963 (38.5)    IV Piggyback 1037.4    Total Intake(mL/kg) 5000.4 (48.5)    Urine (mL/kg/hr) 2680    Blood 443    Chest Tube 280    Total Output 3403    Net +1597.4            _______________________________________________________________ Labs: CBC Latest Ref Rng & Units 01/21/2021 01/21/2021 01/21/2021  WBC 4.0 - 10.5 K/uL - - 15.4(H)  Hemoglobin 13.0 - 17.0 g/dL 9.9(L) 9.5(L) 10.5(L)  Hematocrit 39.0 - 52.0 % 29.0(L) 28.0(L) 30.7(L)  Platelets 150 - 400 K/uL - - 167   CMP Latest Ref Rng & Units 01/21/2021 01/21/2021 01/21/2021  Glucose 70 - 99 mg/dL - - 139(H)  BUN 8 - 23 mg/dL - - 12  Creatinine 0.61 - 1.24 mg/dL - - 1.19  Sodium 135 - 145 mmol/L 142 142  138  Potassium 3.5 - 5.1 mmol/L 4.2 4.1 4.4  Chloride 98 - 111 mmol/L - - 111  CO2 22 - 32 mmol/L - - 21(L)  Calcium 8.9 - 10.3 mg/dL - - 8.1(L)  Total Protein 6.5 - 8.1 g/dL - - -  Total Bilirubin 0.3 - 1.2 mg/dL - - -  Alkaline Phos 38 - 126 U/L - - -  AST 15 - 41 U/L - - -  ALT 0 - 44 U/L - - -    CXR: stable  _______________________________________________________________  Assessment and Plan: POD 1 s/p CABG3  Neuro: pain control CV: wean dob.  Adding midodrine.  On a/s.  Holding BB while on dob.  Will transition to PO amio Pulm: continue pulm hygiene Renal: creat stable.  Good uop GI: advancing diet Heme: stable ID: [email or web address] Endo: SSI Dispo: continue ICU care   Lajuana Matte 01/21/2021 7:20 AM

## 2021-01-22 ENCOUNTER — Inpatient Hospital Stay (HOSPITAL_COMMUNITY): Payer: Medicare Other

## 2021-01-22 DIAGNOSIS — E871 Hypo-osmolality and hyponatremia: Secondary | ICD-10-CM | POA: Diagnosis not present

## 2021-01-22 DIAGNOSIS — I480 Paroxysmal atrial fibrillation: Secondary | ICD-10-CM | POA: Diagnosis not present

## 2021-01-22 DIAGNOSIS — E039 Hypothyroidism, unspecified: Secondary | ICD-10-CM

## 2021-01-22 DIAGNOSIS — Z951 Presence of aortocoronary bypass graft: Secondary | ICD-10-CM | POA: Diagnosis not present

## 2021-01-22 DIAGNOSIS — J9601 Acute respiratory failure with hypoxia: Secondary | ICD-10-CM | POA: Diagnosis not present

## 2021-01-22 LAB — BASIC METABOLIC PANEL
Anion gap: 4 — ABNORMAL LOW (ref 5–15)
BUN: 16 mg/dL (ref 8–23)
CO2: 24 mmol/L (ref 22–32)
Calcium: 8.2 mg/dL — ABNORMAL LOW (ref 8.9–10.3)
Chloride: 104 mmol/L (ref 98–111)
Creatinine, Ser: 1.34 mg/dL — ABNORMAL HIGH (ref 0.61–1.24)
GFR, Estimated: 55 mL/min — ABNORMAL LOW (ref >60)
Glucose, Bld: 123 mg/dL — ABNORMAL HIGH (ref 70–99)
Potassium: 4.1 mmol/L (ref 3.5–5.1)
Sodium: 132 mmol/L — ABNORMAL LOW (ref 135–145)

## 2021-01-22 LAB — PHOSPHORUS: Phosphorus: 2.8 mg/dL (ref 2.5–4.6)

## 2021-01-22 LAB — CBC
HCT: 29.7 % — ABNORMAL LOW (ref 39.0–52.0)
Hemoglobin: 9.9 g/dL — ABNORMAL LOW (ref 13.0–17.0)
MCH: 30.8 pg (ref 26.0–34.0)
MCHC: 33.3 g/dL (ref 30.0–36.0)
MCV: 92.5 fL (ref 80.0–100.0)
Platelets: 170 10*3/uL (ref 150–400)
RBC: 3.21 MIL/uL — ABNORMAL LOW (ref 4.22–5.81)
RDW: 13.1 % (ref 11.5–15.5)
WBC: 18.4 10*3/uL — ABNORMAL HIGH (ref 4.0–10.5)
nRBC: 0 % (ref 0.0–0.2)

## 2021-01-22 LAB — GLUCOSE, CAPILLARY
Glucose-Capillary: 121 mg/dL — ABNORMAL HIGH (ref 70–99)
Glucose-Capillary: 114 mg/dL — ABNORMAL HIGH (ref 70–99)
Glucose-Capillary: 102 mg/dL — ABNORMAL HIGH (ref 70–99)
Glucose-Capillary: 102 mg/dL — ABNORMAL HIGH (ref 70–99)
Glucose-Capillary: 87 mg/dL (ref 70–99)

## 2021-01-22 MED ORDER — FUROSEMIDE 10 MG/ML IJ SOLN
40.0000 mg | Freq: Once | INTRAMUSCULAR | Status: AC
  Administered 2021-01-22: 40 mg via INTRAVENOUS
  Filled 2021-01-22: qty 4

## 2021-01-22 MED ORDER — METOPROLOL TARTRATE 25 MG PO TABS
25.0000 mg | ORAL_TABLET | Freq: Two times a day (BID) | ORAL | Status: DC
  Administered 2021-01-22 – 2021-01-25 (×7): 25 mg via ORAL
  Filled 2021-01-22 (×6): qty 1

## 2021-01-22 MED ORDER — MIDODRINE HCL 5 MG PO TABS
5.0000 mg | ORAL_TABLET | Freq: Three times a day (TID) | ORAL | Status: DC
  Administered 2021-01-22 – 2021-01-24 (×8): 5 mg via ORAL
  Filled 2021-01-22 (×10): qty 1

## 2021-01-22 MED ORDER — ENSURE ENLIVE PO LIQD
237.0000 mL | Freq: Two times a day (BID) | ORAL | Status: DC
  Administered 2021-01-22 – 2021-01-23 (×2): 237 mL via ORAL

## 2021-01-22 NOTE — Discharge Summary (Addendum)
[address]       [place] [address]             [phone]    Physician Discharge Summary  Patient ID: Hector Morris MRN: [id number] DOB/AGE: [birth year] 75 y.o.  Admit date: 01/20/2021 Discharge date: 01/25/2021 Admission Diagnoses:  Patient Active Problem List   Diagnosis Date Noted   Paroxysmal A-fib ([place])    S/P CABG x 3 01/20/2021   Angina pectoris ([place]) 01/02/2021   Right knee pain 11/25/2018   OSA (obstructive sleep apnea) 11/25/2018   S/P AAA repair using bifurcation graft 04/21/2017   Hyperlipidemia LDL goal <70 03/23/2017   Preoperative cardiovascular examination 03/08/2017   CAD (coronary artery disease), native coronary artery 03/08/2017   Thoracic aortic aneurysm without rupture 03/08/2017   AAA (abdominal aortic aneurysm) without rupture 03/08/2017   Hx of colonic polyps 03/31/2011     Discharge Diagnoses:  Patient Active Problem List   Diagnosis Date Noted   Paroxysmal A-fib ([place])    S/P CABG x 3 01/20/2021   Angina pectoris ([place]) 01/02/2021   Right knee pain 11/25/2018   OSA (obstructive sleep apnea) 11/25/2018   S/P AAA repair using bifurcation graft 04/21/2017   Hyperlipidemia LDL goal <70 03/23/2017   Preoperative cardiovascular examination 03/08/2017   CAD (coronary artery disease), native coronary artery 03/08/2017   Thoracic aortic aneurysm without rupture 03/08/2017   AAA (abdominal aortic aneurysm) without rupture 03/08/2017   Hx of colonic polyps 03/31/2011     Discharged Condition: good  --Cardiology and CT surgery follow-up appointments requested on 01/21/2021.  History of Present Illness:     75 year old male referred for artery bypass grafting.  Over the past year he has had some exertional dyspnea and anginal symptoms.  He recently underwent a left heart cath which showed severe three-vessel coronary artery disease.  He also has a history of an ascending aortic aneurysm, and underwent EVAR placement for  an abdominal aneurysm in 2019.  [name] was seen in the office by Dr. Kipp Brood regarding potential coronary bypass grafting.  Preoperative work-up was initiated.  The echocardiogram showed ejection fraction estimate of 45 to 50%.  Mitral valve had mild to moderate regurgitation with no stenosis.  The aortic valve was tricuspid with trivial aortic insufficiency and no aortic valve stenosis.  The ascending aorta was mildly dilated at 44 mm.  The aortic root was 38 mm. Coronary bypass grafting was offered to [name] by Dr. Kipp Brood and the patient elected to proceed.  Course in Hospital: [name] was admitted for elective surgery on 01/20/2021 and taken to the operating room where three-vessel coronary bypass grafting was carried out.  The left internal mammary artery was grafted to the left anterior descending coronary artery.  Saphenous vein grafts were placed to the posterior descending coronary and obtuse marginal coronary arteries.  Following the procedure, he separated from cardiopulmonary bypass without difficulty.  He was transferred to the ICU in stable condition.  The critical care team was consulted for assistance with management postoperatively.  Dobutamine was used to augment hemodynamics early postoperatively.  Mr. Ederer was weaned from the mechanical ventilator and extubated by 3 AM on postop day 1.  Vital signs and hemodynamics remained stable as the dobutamine was weaned.  He was started on midodrine on postop day 1.  He developed atrial fibrillation with RVR in the early postoperative course that was treated with an IV amiodarone load.  This was transitioned to  oral amiodarone on the first postoperative day.Pacer wires were removed on POD # 2.  Patient did develop a mild AKI postoperatively and was able to be diuresed.  Creatinine began to rise again and diuretics have subsequently been discontinued.  He does have an expected acute blood loss anemia.  Values are pretty stable.  Most  recent hemoglobin hematocrit dated 01/24/2021 are 9.4 and 28.2 respectively.  Patient is tolerating routine activities using standard cardiac rehab modalities.  Incisions are noted to be healing well without evidence of infection.  He is tolerating diet.  Overall, at the time of discharge the patient is felt to be quite stable.  Consults: cardiology  Significant Diagnostic Studies:  DG Chest 2 View  Result Date: 01/24/2021 CLINICAL DATA:  Status post CABG EXAM: CHEST - 2 VIEW COMPARISON:  01/22/2021 FINDINGS: Interval removal of right neck vascular catheter and chest and mediastinal drainage tubes. Unchanged small left pleural effusion with associated atelectasis. No pneumothorax. Unchanged mild diffuse interstitial pulmonary opacity. Cardiomegaly status post median sternotomy and CABG. IMPRESSION: 1. Interval removal of right neck vascular catheter and chest and mediastinal drainage tubes. 2. Unchanged small left pleural effusion with associated atelectasis. No pneumothorax. 3. Unchanged mild diffuse interstitial pulmonary opacity. 4. Cardiomegaly status post median sternotomy and CABG. Electronically Signed   By: Delanna Ahmadi M.D.   On: 01/24/2021 12:38   DG Chest 2 View  Result Date: 01/15/2021 CLINICAL DATA:  75 year old male with preoperative chest x-ray EXAM: CHEST - 2 VIEW COMPARISON:  07/07/2019, 04/21/2017, chest CT 09/20/2020 FINDINGS: Cardiomediastinal silhouette unchanged in size and contour. Reticular opacities of the lungs, with no new confluent airspace disease. Similar appearance of low lung volumes with asymmetric elevation of the right hemidiaphragm. No pneumothorax or pleural effusion. No acute displaced fracture. Degenerative changes of the spine. Chronic deformity of left-sided ribs. IMPRESSION: Stable appearance of the chest x-ray over time, with nonspecific changes of fibrosis/scarring and no evidence of acute cardiopulmonary disease Electronically Signed   By: Corrie Mckusick D.O.    On: 01/15/2021 16:26   CARDIAC CATHETERIZATION  Result Date: 01/03/2021   Ost RCA to Prox RCA lesion is 99% stenosed.   Prox RCA to Mid RCA lesion is 100% stenosed.   Prox LAD lesion is 70% stenosed with 0% stenosed side branch in 1st Sept.   1st Mrg lesion is 90% stenosed.   Prox Cx to Mid Cx lesion is 90% stenosed.   There is mild left ventricular systolic dysfunction.   LV end diastolic pressure is mildly elevated.   The left ventricular ejection fraction is 45-50% by visual estimate. Severe 3 vessel obstructive CAD with heavily calcified coronaries. There is a 70% stenosis of the LAD at the takeoff of the first diagonal with abnormal RFR of 0.86. there is 90% stenosis in a large first OM and in the continuation of the LCx. The RCA is chronically occluded proximally with good left to right collaterals. Mild LV dysfunction EF estimated at 45% with anterior hypokinesis Mildly elevated LVEDP 22 mm Hg Plan: referral to CT surgery for consideration of CABG   DG Chest Port 1 View  Result Date: 01/22/2021 CLINICAL DATA:  Follow-up chest tube EXAM: PORTABLE CHEST 1 VIEW COMPARISON:  01/21/2021 FINDINGS: Left chest tube and right internal jugular central line remain in place. Previous median sternotomy and CABG. Persistent cardiomegaly. Persistent atelectasis in both lower lungs. No pneumothorax. IMPRESSION: No pneumothorax. Persistent cardiomegaly and persistent atelectasis in the lower lungs. Electronically Signed   By: Elta Guadeloupe  Shogry M.D.   On: 01/22/2021 08:31   DG Chest Port 1 View  Result Date: 01/21/2021 CLINICAL DATA:  Chest tube present status post CABG, sore chest EXAM: PORTABLE CHEST 1 VIEW COMPARISON:  Chest radiograph 1 day prior FINDINGS: The patient has been extubated. A right IJ vascular sheath is in stable position terminating in the mid to lower SVC. A mediastinal drain and left basilar chest tube are stable. Median sternotomy wires and mediastinal surgical clips are stable. The cardiac  silhouette is significantly enlarged, unchanged. The upper mediastinum is also prominent, unchanged. Lung volumes are low. There are patchy opacities in the lung bases, slightly worsened on the left but improved on the right. There are likely small bilateral pleural effusions. There is no pneumothorax. There is no acute osseous abnormality. IMPRESSION: Patchy opacities in the lung bases likely reflecting atelectasis, slightly worsened on the left but improved on the right. No pneumothorax identified. Electronically Signed   By: Valetta Mole M.D.   On: 01/21/2021 08:33   DG Chest Port 1 View  Result Date: 01/20/2021 CLINICAL DATA:  Postop day 0 status post CABG. EXAM: PORTABLE CHEST 1 VIEW COMPARISON:  12/15/2020 FINDINGS: Endotracheal tube tip is 4.1 cm above the carina. Right IJ line tip: SVC. Tubing projects over the chest, 1 of these lines appears to be a left chest tube and the other is probably a mediastinal drain, projecting over the right cardiac shadow but subsequently crossing back to the left of midline. Aligned sternotomy wires noted. The patient is rotated to the right on today's radiograph, reducing diagnostic sensitivity and specificity. No pneumothorax identified. Low lung volumes are present, causing crowding of the pulmonary vasculature. Bandlike opacities at the lung bases and left mid lung favoring atelectasis. Mild enlargement of the cardiopericardial silhouette. No significant blunting of the costophrenic angles. Old healed left posterolateral rib fractures. IMPRESSION: 1. Postop day 0 status post CABG without substantial complicating feature identified. There is mild bibasilar and left midlung atelectasis along with low lung volumes. 2. Mild enlargement of the cardiopericardial silhouette without overt edema or blunting of the costophrenic angles. 3. No pneumothorax identified. Electronically Signed   By: Van Clines M.D.   On: 01/20/2021 17:47   ECHOCARDIOGRAM COMPLETE  Result  Date: 01/10/2021    ECHOCARDIOGRAM REPORT   Patient Name:   DARRON STUCK [facility] Date of Exam: 01/10/2021 Medical Rec #:  [id number]         Height:       70.0 in Accession #:    [id number]        Weight:       220.0 lb Date of Birth:  June 09, 1945         BSA:          2.174 m Patient Age:    55 years          BP:           136/85 mmHg Patient Gender: M                 HR:           91 bpm. Exam Location:  Outpatient Procedure: 2D Echo, 3D Echo, Cardiac Doppler, Color Doppler and Strain Analysis Indications:    I25.110 Atherosclerotic heart disease of native coronary artery                 with unstable angina pectoris  History:        Patient has no prior history of Echocardiogram  examinations.                 CAD; Signs/Symptoms:Chest Pain.  Sonographer:    Roseanna Rainbow RDCS Referring Phys: [id number] HARRELL O LIGHTFOOT  Sonographer Comments: Technically difficult study due to poor echo windows. Global longitudinal strain was attempted. IMPRESSIONS  1. Hypokinesis of the inferolateral and basal inferior wall with overall mild LV dysfunction.  2. Left ventricular ejection fraction, by estimation, is 45 to 50%. The left ventricle has mildly decreased function. The left ventricle demonstrates regional wall motion abnormalities (see scoring diagram/findings for description). There is moderate left ventricular hypertrophy. Left ventricular diastolic parameters are consistent with Grade I diastolic dysfunction (impaired relaxation).  3. Right ventricular systolic function is normal. The right ventricular size is normal. There is normal pulmonary artery systolic pressure.  4. The mitral valve is normal in structure. Mild to moderate mitral valve regurgitation. No evidence of mitral stenosis.  5. The aortic valve is tricuspid. Aortic valve regurgitation is trivial. Aortic valve sclerosis is present, with no evidence of aortic valve stenosis.  6. Aortic dilatation noted. There is borderline dilatation of the aortic root, measuring  38 mm. There is mild dilatation of the ascending aorta, measuring 44 mm.  7. The inferior vena cava is normal in size with greater than 50% respiratory variability, suggesting right atrial pressure of 3 mmHg. Comparison(s): No prior Echocardiogram. FINDINGS  Left Ventricle: Left ventricular ejection fraction, by estimation, is 45 to 50%. The left ventricle has mildly decreased function. The left ventricle demonstrates regional wall motion abnormalities. The left ventricular internal cavity size was normal in size. There is moderate left ventricular hypertrophy. Left ventricular diastolic parameters are consistent with Grade I diastolic dysfunction (impaired relaxation). Right Ventricle: The right ventricular size is normal. Right ventricular systolic function is normal. There is normal pulmonary artery systolic pressure. The tricuspid regurgitant velocity is 2.31 m/s, and with an assumed right atrial pressure of 3 mmHg,  the estimated right ventricular systolic pressure is 01.6 mmHg. Left Atrium: Left atrial size was normal in size. Right Atrium: Right atrial size was normal in size. Pericardium: There is no evidence of pericardial effusion. Mitral Valve: The mitral valve is normal in structure. Mild to moderate mitral valve regurgitation. No evidence of mitral valve stenosis. Tricuspid Valve: The tricuspid valve is normal in structure. Tricuspid valve regurgitation is mild . No evidence of tricuspid stenosis. Aortic Valve: The aortic valve is tricuspid. Aortic valve regurgitation is trivial. Aortic valve sclerosis is present, with no evidence of aortic valve stenosis. Pulmonic Valve: The pulmonic valve was normal in structure. Pulmonic valve regurgitation is trivial. No evidence of pulmonic stenosis. Aorta: Aortic dilatation noted. There is borderline dilatation of the aortic root, measuring 38 mm. There is mild dilatation of the ascending aorta, measuring 44 mm. Venous: The inferior vena cava is normal in size  with greater than 50% respiratory variability, suggesting right atrial pressure of 3 mmHg. IAS/Shunts: No atrial level shunt detected by color flow Doppler. Additional Comments: Hypokinesis of the inferolateral and basal inferior wall with overall mild LV dysfunction.  LEFT VENTRICLE PLAX 2D LVIDd:         4.80 cm      Diastology LVIDs:         3.90 cm      LV e' medial:    4.90 cm/s LV PW:         1.40 cm      LV E/e' medial:  12.0 LV IVS:  1.20 cm      LV e' lateral:   7.83 cm/s LVOT diam:     2.80 cm      LV E/e' lateral: 7.5 LV SV:         102 LV SV Index:   47 LVOT Area:     6.16 cm                              3D Volume EF: LV Volumes (MOD)            3D EF:        51 % LV vol d, MOD A2C: 129.0 ml LV EDV:       152 ml LV vol d, MOD A4C: 94.9 ml  LV ESV:       74 ml LV vol s, MOD A2C: 84.7 ml  LV SV:        77 ml LV vol s, MOD A4C: 45.4 ml LV SV MOD A2C:     44.3 ml LV SV MOD A4C:     94.9 ml LV SV MOD BP:      45.8 ml RIGHT VENTRICLE             IVC RV S prime:     11.10 cm/s  IVC diam: 2.00 cm TAPSE (M-mode): 2.6 cm LEFT ATRIUM           Index        RIGHT ATRIUM           Index LA diam:      4.00 cm 1.84 cm/m   RA Area:     15.70 cm LA Vol (A2C): 67.9 ml 31.24 ml/m  RA Volume:   33.60 ml  15.46 ml/m LA Vol (A4C): 17.5 ml 8.05 ml/m  AORTIC VALVE             PULMONIC VALVE LVOT Vmax:   78.00 cm/s  PR End Diast Vel: 1.84 msec LVOT Vmean:  54.200 cm/s LVOT VTI:    0.165 m  AORTA Ao Root diam: 3.80 cm Ao Asc diam:  4.40 cm MITRAL VALVE                  TRICUSPID VALVE MV Area (PHT): 4.39 cm       TR Peak grad:   21.3 mmHg MV Decel Time: 173 msec       TR Vmax:        231.00 cm/s MR Peak grad:    104.9 mmHg MR Mean grad:    66.0 mmHg    SHUNTS MR Vmax:         512.00 cm/s  Systemic VTI:  0.16 m MR Vmean:        383.0 cm/s   Systemic Diam: 2.80 cm MR PISA:         0.84 cm MR PISA Eff ROA: 7 mm MR PISA Radius:  0.37 cm MV E velocity: 58.70 cm/s MV A velocity: 107.00 cm/s MV E/A ratio:  0.55 Kirk Ruths MD Electronically signed by Kirk Ruths MD Signature Date/Time: 01/10/2021/4:08:19 PM    Final    ECHO INTRAOPERATIVE TEE  Result Date: 01/21/2021  *INTRAOPERATIVE TRANSESOPHAGEAL REPORT *  Patient Name:   RACE LATOUR [facility] Date of Exam: 01/20/2021 Medical Rec #:  [id number]         Height:       70.0 in Accession #:    [id number]  Weight:       215.1 lb Date of Birth:  [birth year]         BSA:          2.15 m Patient Age:    60 years          BP:           132/71 mmHg Patient Gender: M                 HR:           71 bpm. Exam Location:  Anesthesiology Transesophogeal exam was perform intraoperatively during surgical procedure. Patient was closely monitored under general anesthesia during the entirety of examination. Indications:     Coronary artery disease involving native coronary artery of                  native heart with angina pectoris ([place]) [I25.119 (ICD-10-CM)];                  Unstable angina ([facility]) [I20.0 (ICD-10 Performing Phys: [id number] Lucile Crater LIGHTFOOT Diagnosing Phys: Hoy Morn MD Complications: No known complications during this procedure. POST-OP IMPRESSIONS _ Left Ventricle: The left ventricle is unchanged from pre-bypass. has moderately reduced systolic function, with an ejection fraction of 45%. The cavity size was decreased. The wall motion is abnormal with regional variation. _ Right Ventricle: The right ventricle appears unchanged from pre-bypass. _ Aorta: The aorta appears unchanged from pre-bypass. _ Left Atrium: The left atrium appears unchanged from pre-bypass. _ Left Atrial Appendage: The left atrial appendage appears unchanged from pre-bypass. _ Aortic Valve: The aortic valve appears unchanged from pre-bypass. _ Mitral Valve: The mitral valve appears unchanged from pre-bypass. _ Tricuspid Valve: The tricuspid valve appears unchanged from pre-bypass. _ Pulmonic Valve: The pulmonic valve appears unchanged from pre-bypass. _ Interatrial Septum: The interatrial  septum appears unchanged from pre-bypass. _ Interventricular Septum: The interventricular septum appears unchanged from pre-bypass. _ Pericardium: The pericardium appears unchanged from pre-bypass. _ Comments: Post-bypass images reviewed with surgeon. PRE-OP FINDINGS  Left Ventricle: The left ventricle has mild-moderately reduced systolic function, with an ejection fraction of 40-45%. The cavity size was normal. There is moderate concentric left ventricular hypertrophy.  LV Wall Scoring: The mid inferolateral segment and mid inferior segment are hypokinetic.  Right Ventricle: The right ventricle has normal systolic function. The cavity was normal. There is no increase in right ventricular wall thickness. Left Atrium: Left atrial size was normal in size. No left atrial/left atrial appendage thrombus was detected. Right Atrium: Right atrial size was normal in size. Interatrial Septum: No atrial level shunt detected by color flow Doppler. Pericardium: There is no evidence of pericardial effusion. Mitral Valve: The mitral valve is normal in structure. Mitral valve regurgitation is mild by color flow Doppler. There is No evidence of mitral stenosis. Tricuspid Valve: The tricuspid valve was normal in structure. Tricuspid valve regurgitation is mild by color flow Doppler. Aortic Valve: The aortic valve is tricuspid Aortic valve regurgitation is trivial by color flow Doppler. Pulmonic Valve: The pulmonic valve was normal in structure. Pulmonic valve regurgitation is trivial by color flow Doppler. Aorta: There is borderline dilatation of the aortic root, measuring 38 mm. There is evidence of plaque in the descending aorta. Pulmonary Artery: The pulmonary artery is of normal size. +--------------+--------++ LEFT VENTRICLE         +--------------+--------++ PLAX 2D                +--------------+--------++ LVIDd:  4.40 cm  +--------------+--------++ LVIDs:        3.50 cm  +--------------+--------++ LVOT  diam:    2.00 cm  +--------------+--------++ LV SV:        37 ml    +--------------+--------++ LV SV Index:  16.57    +--------------+--------++ LVOT Area:    3.14 cm +--------------+--------++                        +--------------+--------++  +-------------+-------++ AORTA                +-------------+-------++ Ao Root diam:3.80 cm +-------------+-------++ Ao Asc diam: 4.00 cm +-------------+-------++  +--------------+-------+ SHUNTS                +--------------+-------+ Systemic Diam:2.00 cm +--------------+-------+  Hoy Morn MD Electronically signed by Hoy Morn MD Signature Date/Time: 01/21/2021/5:11:00 PM    Final    VAS US DOPPLER PRE CABG  Result Date: 01/16/2021 PREOPERATIVE VASCULAR EVALUATION Patient Name:  ST[facility] SOVINE [facility]  Date of Exam:   01/15/2021 Medical Rec #: [id number]          Accession #:    [id number] Date of Birth: Aug 29, 1945          Patient Gender: M Patient Age:   48 years Exam Location:  [facility] Procedure:      VAS US DOPPLER PRE CABG Referring Phys: HARRELL LIGHTFOOT --------------------------------------------------------------------------------  Indications:      Pre-CABG. Risk Factors:     Hyperlipidemia, past history of smoking, coronary artery                   disease. Other Factors:    Thoracic AA, AAA (post repair). Comparison Study: Previous ABI and Carotid 04/12/14 WNL Performing Technologist: Rogelia Rohrer RVT, RDMS  Examination Guidelines: A complete evaluation includes B-mode imaging, spectral Doppler, color Doppler, and power Doppler as needed of all accessible portions of each vessel. Bilateral testing is considered an integral part of a complete examination. Limited examinations for reoccurring indications may be performed as noted.  Right Carotid Findings: +----------+--------+--------+--------+---------------------+------------------+           PSV cm/sEDV cm/sStenosisDescribe             Comments            +----------+--------+--------+--------+---------------------+------------------+ CCA Prox  53      12                                                      +----------+--------+--------+--------+---------------------+------------------+ CCA Distal60      18              hypoechoic and       intimal thickening                                   heterogenous                            +----------+--------+--------+--------+---------------------+------------------+ ICA Prox  64      15              focal and calcific                      +----------+--------+--------+--------+---------------------+------------------+  ICA Distal88      27                                                      +----------+--------+--------+--------+---------------------+------------------+ ECA       112     11                                                      +----------+--------+--------+--------+---------------------+------------------+ +----------+--------+-------+--------+------------+           PSV cm/sEDV cmsDescribeArm Pressure +----------+--------+-------+--------+------------+ [id number]                                  +----------+--------+-------+--------+------------+ +---------+--------+--+--------+-+---------+ VertebralPSV cm/s17EDV cm/s5Antegrade +---------+--------+--+--------+-+---------+ Left Carotid Findings: +----------+--------+-------+--------+----------------------+------------------+           PSV cm/sEDV    StenosisDescribe              Comments                             cm/s                                                    +----------+--------+-------+--------+----------------------+------------------+ CCA Prox  52      14             heterogenous and      intimal thickening                                  smooth                                    +----------+--------+-------+--------+----------------------+------------------+ CCA Distal43      11             smooth and            intimal thickening                                  heterogenous                             +----------+--------+-------+--------+----------------------+------------------+ ICA Prox  51      13                                                      +----------+--------+-------+--------+----------------------+------------------+ ICA Distal99      24                                                      +----------+--------+-------+--------+----------------------+------------------+  ECA       121     7              heterogenous and focal                   +----------+--------+-------+--------+----------------------+------------------+ +----------+--------+--------+--------+------------+ SubclavianPSV cm/sEDV cm/sDescribeArm Pressure +----------+--------+--------+--------+------------+           89                                   +----------+--------+--------+--------+------------+ +---------+--------+--+--------+--+---------+ VertebralPSV cm/s42EDV cm/s12Antegrade +---------+--------+--+--------+--+---------+  ABI Findings: +--------+------------------+-----+---------+--------+ Right   Rt Pressure (mmHg)IndexWaveform Comment  +--------+------------------+-----+---------+--------+ [id number]                    triphasic         +--------+------------------+-----+---------+--------+ PTA     162               1.19 biphasic          +--------+------------------+-----+---------+--------+ DP      155               1.14 biphasic          +--------+------------------+-----+---------+--------+ +--------+------------------+-----+---------+-------+ Left    Lt Pressure (mmHg)IndexWaveform Comment +--------+------------------+-----+---------+-------+ [id number]                    triphasic         +--------+------------------+-----+---------+-------+ PTA     159               1.17 triphasic        +--------+------------------+-----+---------+-------+ DP      155               1.14 triphasic        +--------+------------------+-----+---------+-------+ +-------+---------------+----------------+ ABI/TBIToday's ABI/TBIPrevious ABI/TBI +-------+---------------+----------------+ Right  1.19           1.08             +-------+---------------+----------------+ Left   1.17           1.05             +-------+---------------+----------------+  Right Doppler Findings: +--------+--------+-----+---------+--------+ Site    PressureIndexDoppler  Comments +--------+--------+-----+---------+--------+ [id number]          triphasic         +--------+--------+-----+---------+--------+ Radial               triphasic         +--------+--------+-----+---------+--------+ Ulnar                biphasic          +--------+--------+-----+---------+--------+  Left Doppler Findings: +--------+--------+-----+---------+--------+ Site    PressureIndexDoppler  Comments +--------+--------+-----+---------+--------+ [id number]          triphasic         +--------+--------+-----+---------+--------+ Radial               biphasic          +--------+--------+-----+---------+--------+ Ulnar                triphasic         +--------+--------+-----+---------+--------+  Right ABI: Resting right ankle-brachial index is within normal range. No evidence of significant right lower extremity arterial disease. Left ABI: Resting left ankle-brachial index is within normal range. No evidence of significant left lower extremity arterial disease. Right Upper Extremity: Doppler waveforms decrease >50% with right radial compression. Doppler waveforms remain within normal limits  with right ulnar compression. Left Upper Extremity: Doppler waveforms remain within normal limits with left radial  compression. Doppler waveform obliterate with left ulnar compression.  Electronically signed by Harold Barban MD on 01/16/2021 at 12:51:07 AM.    Final      Treatments: surgery:  01/20/2021 Patient:  Brynda Greathouse Gillie Pre-Op Dx: Coronary artery disease                         Congestive heart failure                         Hypertension Post-op Dx: Same Procedure: CABG X 3.  LIMA to LAD.  Reverse saphenous vein graft to PDA, and obtuse marginal Endoscopic greater saphenous vein harvest on the right     Surgeon and Role:      * Lightfoot, Lucile Crater, MD - Primary  Discharge Exam: Blood pressure 127/61, pulse 62, temperature 98.2 F (36.8 C), temperature source Oral, resp. rate 18, height 6\' 2"  (1.88 m), weight 99.3 kg, SpO2 96 %.  General appearance: alert, cooperative, and no distress Heart: regular rate and rhythm Lungs: clear to auscultation bilaterally Abdomen: benign Extremities: minor edema Wound: incis healing well  Discharge Medications:  The patient has been discharged on:   1.Beta Blocker:  Yes [  y ]                              No   [   ]                              If No, reason:  2.Ace Inhibitor/ARB: Yes [   ]                                     No  [  n  ]                                     If No, reason:AKI  3.Statin:   Yes [ y  ]                  No  [   ]                  If No, reason:  4.Ecasa:  Yes  [ y  ]                  No   [   ]                  If No, reason:  Patient had ACS upon admission:n  Plavix/P2Y12 inhibitor: Yes [   ]                                      No  [  n ]     Discharge Instructions     Amb Referral to Cardiac Rehabilitation   Complete by: As directed    Pt plans to attend CRP@ at [facility] in [place],  [place]   Diagnosis: CABG   CABG  X ___: 3 Comment - 01/20/21   After initial evaluation and assessments completed: Virtual Based Care may be provided alone or in conjunction with Phase 2 Cardiac Rehab based on  patient barriers.: Yes   Discharge patient   Complete by: As directed    Discharge disposition: 01-Home or Self Care   Discharge patient date: 01/25/2021      Allergies as of 01/25/2021       Reactions   Bee Venom Anaphylaxis, Swelling   Penicillins Anaphylaxis, Other (See Comments)   "Mushroom" popped up on hip Has patient had a PCN reaction causing immediate rash, facial/tongue/throat swelling, SOB or lightheadedness with hypotension: Yes Has patient had a PCN reaction causing severe rash involving mucus membranes or skin necrosis: Yes Has patient had a PCN reaction that required hospitalization: Yes - In hospital Has patient had a PCN reaction occurring within the last 10 years: No If all of the above answers are "NO", then may proceed with Cephalosporin use.   Shellfish Allergy Hives, Swelling   CRAB MEAT > WHELPS   Latex    UNSPECIFIED REACTION         Medication List     STOP taking these medications    carvedilol 12.5 MG tablet Commonly known as: COREG   nitroGLYCERIN 0.4 MG SL tablet Commonly known as: Nitrostat   POTASSIUM PO       TAKE these medications    amiodarone 200 MG tablet Commonly known as: PACERONE Take 1 tablet (200 mg total) by mouth 2 (two) times daily.   aspirin 325 MG EC tablet Take 1 tablet (325 mg total) by mouth daily. What changed:  medication strength how much to take when to take this   capsaicin 0.025 % cream Commonly known as: ZOSTRIX Apply 1 application topically 2 (two) times daily as needed (for feet).   levothyroxine 25 MCG tablet Commonly known as: Synthroid Take 1 tablet (25 mcg total) by mouth daily before breakfast. What changed: how much to take   metoprolol tartrate 25 MG tablet Commonly known as: LOPRESSOR Take 1 tablet (25 mg total) by mouth 2 (two) times daily.   omeprazole 20 MG capsule Commonly known as: PRILOSEC Take 40 mg by mouth daily.   oxyCODONE 5 MG immediate release tablet Commonly known  as: Oxy IR/ROXICODONE Take 1 tablet (5 mg total) by mouth every 6 (six) hours as needed for severe pain.   PARoxetine 20 MG tablet Commonly known as: PAXIL Take 10 mg by mouth in the morning and at bedtime.   rosuvastatin 40 MG tablet Commonly known as: CRESTOR Take 1 tablet (40 mg total) by mouth daily.   VITAMIN B-12 PO Take by mouth daily.   Vitamin D3 125 MCG (5000 UT) Caps Take 5,000 Units by mouth every morning.         Signed: @mec @ 01/26/2021, 10:27 AM

## 2021-01-22 NOTE — Progress Notes (Signed)
NAME:  Hector Morris, MRN:  [id number], DOB:  1946-02-08, LOS: 2 ADMISSION DATE:  01/20/2021, CONSULTATION DATE:  11/28 REFERRING MD:  lightfoot, CHIEF COMPLAINT:  post-op ICU care    History of Present Illness:  75 year old male patient recently diagnosed with severe three-vessel coronary artery disease identified via evaluation for exertional dyspnea.  Referred to cardiothoracic surgery for CABG x3 on 11/28 return to intensive care intubated, and sedated pulmonary asked to assist with postoperative ICU needs  He is now POD 2  Pertinent  Medical History  Gastroesophageal reflux disease, history of hiatal hernia, history of renal stones, hyperlipidemia, sleep apnea on CPAP, seasonal allergies, anxiety.  Exertional dyspnea with new diagnosis of severe three-vessel coronary artery disease.  History of ascending aortic aneurysm undergoing EVAR placement in 2019  Significant Hospital Events: Including procedures, antibiotic start and stop dates in addition to other pertinent events   11/28 2 OR for elective three-vessel CABG intraoperative course notable for atrial fibrillation, converted to normal sinus after amiodarone 11/29 extubated overnight 11/30 OOB to chair. Off gtts  Interim History / Subjective:  Off drips   +2L admission   Subjective: endorses pain when coughing, feels like overnight was "rough" denies chest pain or Sob otherwise  Objective   Blood pressure 132/70, pulse 71, temperature 97.8 F (36.6 C), temperature source Oral, resp. rate 18, height 5\' 10"  (1.778 m), weight 103.7 kg, SpO2 93 %. CVP:  [6 mmHg-17 mmHg] 9 mmHg      Intake/Output Summary (Last 24 hours) at 01/22/2021 0906 Last data filed at 01/22/2021 0400 Gross per 24 hour  Intake 985.58 ml  Output 470 ml  Net 515.58 ml   Filed Weights   01/20/21 0803 01/21/21 0430 01/22/21 0500  Weight: 95.3 kg 103 kg 103.7 kg    Examination:  General: Sitting in chair, elderly,   NAD, appears  comfortable HEENT: MM pink/moist, anicteric, atraumatic Neuro: RASS 0, PERRL 29mm, GCS 15 CV: S1S2, NSR, no m/r/g appreciated, CT to -20 with no AL.  PULM:  clear in the upper lobes, clear in the lower lobes, trachea midline, chest expansion symmetric GI: soft, bsx4 active, non-tender   Extremities: warm/dry, no pretibial edema, capillary refill less than 3 seconds  Skin: Post op sites to chest c/d/I.  no rashes or lesions noted   Resolved Hospital Problem list     Assessment & Plan:  Severe three-vessel coronary artery disease now status post CABG x3 on 11/28- POD2 Off drips.  -Post op mgmt per TCTS -Midodrine being weaned bt TCTS -TCTS to pull pacer wires and chest tube on 11/30 -Continue in ICU. Monitor Tele -Continue PRN Oxy and morphine for chest pain -Continue ASA/Statin -Continue metoprolol 25 po BID  Paroxysmal atrial fibrillation: Post op? No AF listed in med history or mentioned in cards outpatient note.  Plan -Continue PO amio -Continue tele -Remains in sinus  Acute hypoxic respiratory failure post-op- Improving On 2LNC. 500 On IS. CXR- no pneumo, atelectasis is BLLL -Continue pulm toilet -Continue yupelri and brovana -continue mucinex bid -mobilize as able  AKI Creat 1.0 on 11/28, no 1.34. 460 UOP past 24 hours. -Ensure renal perfusion. Goal MAP 65 or greater. -Avoid neprotoxic drugs as possible. -Strict I&O's -Follow up AM creatinine -Will discuss gentile diuresis   History of sleep apnea -Continue CPAP QHS  H/o hypothyroidism  -Continue Synthroid   History of hyperlipidemia -Continue Crestor  Hyponatremia Na 132. -Monitor, suspect will improve with diuresis  Acute blood loss anemia HGB 10.3>9.9.  No signs of active bleeding -Transfuse PRBC if HBG less than 7 -Obtain AM CBC to trend H&H -Monitor for signs of bleeding   Best Practice (right click and "Reselect all SmartList Selections" daily)   Diet/type: Regular consistency (see  orders) DVT prophylaxis: LMWH GI prophylaxis: PPI Lines: Central line Foley:  N/A Code Status:  full code Last date of multidisciplinary goals of care discussion [Per primary]   Critical care time: N/A    [employer]., MSN, APRN, AGACNP-BC [facility]  01/22/2021 , 9:07 AM  Please see [email or web address] for pager details  If no response, please call [phone] After hours, please call Elink at [phone]

## 2021-01-22 NOTE — Progress Notes (Signed)
      [address]       [place] [address]             [phone]                 2 Days Post-Op Procedure(s) (LRB): CORONARY ARTERY BYPASS GRAFTING (CABG) TIMES THREE USING LEFT INTERNAL MAMMARY ARTERY AND RIGHT GREATER SAPHENOUS VEIN HARVESTED ENDOSCOPICALLY. (N/A) TRANSESOPHAGEAL ECHOCARDIOGRAM (TEE) (N/A) APPLICATION OF CELL SAVER (N/A) ENDOVEIN HARVEST OF GREATER SAPHENOUS VEIN (Right)   Events: No events  _______________________________________________________________ Vitals: BP 132/70   Pulse 67   Temp 98.2 F (36.8 C) (Oral)   Resp 17   Ht 5\' 10"  (1.778 m)   Wt 103.7 kg   SpO2 92%   BMI 32.80 kg/m  Filed Weights   01/20/21 0803 01/21/21 0430 01/22/21 0500  Weight: 95.3 kg 103 kg 103.7 kg     - Neuro: alert NAD  - Cardiovascular: sinus  Drips: none   CVP:  [6 mmHg-21 mmHg] 9 mmHg  - Pulm: coarse BS   ABG    Component Value Date/Time   PHART 7.276 (L) 01/21/2021 0433   PCO2ART 40.4 01/21/2021 0433   PO2ART 73 (L) 01/21/2021 0433   HCO3 18.7 (L) 01/21/2021 0433   TCO2 20 (L) 01/21/2021 0433   ACIDBASEDEF 7.0 (H) 01/21/2021 0433   O2SAT 92.0 01/21/2021 0433    - Abd: ND - Extremity: warm  .Intake/Output      11/29 0701 11/30 0700 11/30 0701 12/01 0700   P.O. 480    I.V. (mL/kg) 453.5 (4.4)    IV Piggyback 150    Total Intake(mL/kg) 1083.5 (10.4)    Urine (mL/kg/hr) 460 (0.2)    Blood     Chest Tube 160    Total Output 620    Net +463.5            _______________________________________________________________ Labs: CBC Latest Ref Rng & Units 01/22/2021 01/21/2021 01/21/2021  WBC 4.0 - 10.5 K/uL 18.4(H) 18.6(H) -  Hemoglobin 13.0 - 17.0 g/dL 9.9(L) 10.3(L) 9.9(L)  Hematocrit 39.0 - 52.0 % 29.7(L) 30.4(L) 29.0(L)  Platelets 150 - 400 K/uL 170 167 -   CMP Latest Ref Rng & Units 01/22/2021 01/21/2021 01/21/2021  Glucose 70 - 99 mg/dL 123(H) 122(H) -  BUN 8 - 23 mg/dL 16 13 -  Creatinine 0.61 - 1.24 mg/dL  1.34(H) 1.24 -  Sodium 135 - 145 mmol/L 132(L) 135 142  Potassium 3.5 - 5.1 mmol/L 4.1 3.9 4.2  Chloride 98 - 111 mmol/L 104 108 -  CO2 22 - 32 mmol/L 24 21(L) -  Calcium 8.9 - 10.3 mg/dL 8.2(L) 8.1(L) -  Total Protein 6.5 - 8.1 g/dL - - -  Total Bilirubin 0.3 - 1.2 mg/dL - - -  Alkaline Phos 38 - 126 U/L - - -  AST 15 - 41 U/L - - -  ALT 0 - 44 U/L - - -    CXR: stable  _______________________________________________________________  Assessment and Plan: POD 2 s/p CABG3  Neuro: pain control CV: weaning midodrine.  On a/s/bb. PO amio.  Will remove wires Pulm: continue pulm hygiene.  Will remove chest tubes Renal: creat slightly up.  Gentle diuresis GI: on diet Heme: stable ID: afebrile Endo: SSI Dispo: continue ICU care to watch creat.   Lajuana Matte 01/22/2021 7:11 AM

## 2021-01-22 NOTE — Progress Notes (Signed)
Patient ID: Hector Morris, male   DOB: [birth year], 75 y.o.   MRN: [id number]  TCTS Evening Rounds:  Hemodynamically stable in sinus rhythm.  Diuresing well  Continue present course.

## 2021-01-22 NOTE — Plan of Care (Signed)
  Problem: Clinical Measurements: Goal: Ability to maintain clinical measurements within normal limits will improve Outcome: Progressing   Problem: Activity: Goal: Risk for activity intolerance will decrease Outcome: Progressing   Problem: Safety: Goal: Ability to remain free from injury will improve Outcome: Progressing   Problem: Activity: Goal: Risk for activity intolerance will decrease Outcome: Progressing

## 2021-01-22 NOTE — Progress Notes (Signed)
Pt declined CPAP tonight. °

## 2021-01-23 LAB — CBC
HCT: 27 % — ABNORMAL LOW (ref 39.0–52.0)
Hemoglobin: 9.1 g/dL — ABNORMAL LOW (ref 13.0–17.0)
MCH: 30.6 pg (ref 26.0–34.0)
MCHC: 33.7 g/dL (ref 30.0–36.0)
MCV: 90.9 fL (ref 80.0–100.0)
Platelets: 170 10*3/uL (ref 150–400)
RBC: 2.97 MIL/uL — ABNORMAL LOW (ref 4.22–5.81)
RDW: 13.1 % (ref 11.5–15.5)
WBC: 14.5 10*3/uL — ABNORMAL HIGH (ref 4.0–10.5)
nRBC: 0 % (ref 0.0–0.2)

## 2021-01-23 LAB — BASIC METABOLIC PANEL
Anion gap: 6 (ref 5–15)
BUN: 18 mg/dL (ref 8–23)
CO2: 27 mmol/L (ref 22–32)
Calcium: 8.1 mg/dL — ABNORMAL LOW (ref 8.9–10.3)
Chloride: 101 mmol/L (ref 98–111)
Creatinine, Ser: 1.26 mg/dL — ABNORMAL HIGH (ref 0.61–1.24)
GFR, Estimated: 59 mL/min — ABNORMAL LOW (ref >60)
Glucose, Bld: 104 mg/dL — ABNORMAL HIGH (ref 70–99)
Potassium: 3.9 mmol/L (ref 3.5–5.1)
Sodium: 134 mmol/L — ABNORMAL LOW (ref 135–145)

## 2021-01-23 LAB — GLUCOSE, CAPILLARY
Glucose-Capillary: 117 mg/dL — ABNORMAL HIGH (ref 70–99)
Glucose-Capillary: 96 mg/dL (ref 70–99)
Glucose-Capillary: 96 mg/dL (ref 70–99)
Glucose-Capillary: 97 mg/dL (ref 70–99)

## 2021-01-23 MED ORDER — SODIUM CHLORIDE 0.9 % IV SOLN: 250.0000 mL | INTRAVENOUS | Status: DC | PRN

## 2021-01-23 MED ORDER — ~~LOC~~ CARDIAC SURGERY, PATIENT & FAMILY EDUCATION: Freq: Once | Status: AC

## 2021-01-23 MED ORDER — GUAIFENESIN 100 MG/5ML PO LIQD
5.0000 mL | ORAL | Status: DC | PRN
  Administered 2021-01-23 (×3): 5 mL via ORAL
  Filled 2021-01-23 (×3): qty 5

## 2021-01-23 MED ORDER — FUROSEMIDE 40 MG PO TABS
40.0000 mg | ORAL_TABLET | Freq: Every day | ORAL | Status: DC
  Administered 2021-01-23 – 2021-01-24 (×2): 40 mg via ORAL
  Filled 2021-01-23 (×3): qty 1

## 2021-01-23 MED ORDER — POTASSIUM CHLORIDE CRYS ER 20 MEQ PO TBCR
40.0000 meq | EXTENDED_RELEASE_TABLET | Freq: Once | ORAL | Status: AC
Start: 2021-01-23 — End: 2021-01-23
  Administered 2021-01-23: 40 meq via ORAL
  Filled 2021-01-23: qty 2

## 2021-01-23 MED ORDER — SODIUM CHLORIDE 0.9% FLUSH: 3.0000 mL | INTRAVENOUS | Status: DC | PRN

## 2021-01-23 MED ORDER — SODIUM CHLORIDE 0.9% FLUSH
3.0000 mL | Freq: Two times a day (BID) | INTRAVENOUS | Status: DC
  Administered 2021-01-23 – 2021-01-24 (×3): 3 mL via INTRAVENOUS

## 2021-01-23 MED FILL — Potassium Chloride Inj 2 mEq/ML: INTRAVENOUS | Qty: 40 | Status: AC

## 2021-01-23 MED FILL — Heparin Sodium (Porcine) Inj 1000 Unit/ML: Qty: 1000 | Status: AC

## 2021-01-23 MED FILL — Lidocaine HCl Local Preservative Free (PF) Inj 2%: INTRAMUSCULAR | Qty: 15 | Status: AC

## 2021-01-23 NOTE — Progress Notes (Signed)
Pt refused CPAP for the night and is on a [place] sitting in the recliner. RT will monitor as needed.

## 2021-01-23 NOTE — Progress Notes (Signed)
eLink Physician-Brief Progress Note Patient Name: Hector Morris DOB: 01/28/1946 MRN: [id number]   Date of Service  01/23/2021  HPI/Events of Note  Pt requesting for cough medication.  He is s/p CABG with hx of COPD on brovana, yupelri, duonebs.  Pt given mucinex over the past 2 days.   eICU Interventions  Guaifenesin prn ordered.      Intervention Category Minor Interventions: Other:  Elsie Lincoln 01/23/2021, 2:47 AM

## 2021-01-23 NOTE — Progress Notes (Signed)
      [address]       [place] [address]             [phone]                 3 Days Post-Op Procedure(s) (LRB): CORONARY ARTERY BYPASS GRAFTING (CABG) TIMES THREE USING LEFT INTERNAL MAMMARY ARTERY AND RIGHT GREATER SAPHENOUS VEIN HARVESTED ENDOSCOPICALLY. (N/A) TRANSESOPHAGEAL ECHOCARDIOGRAM (TEE) (N/A) APPLICATION OF CELL SAVER (N/A) ENDOVEIN HARVEST OF GREATER SAPHENOUS VEIN (Right)   Events: No events  _______________________________________________________________ Vitals: BP 107/60   Pulse 74   Temp 99 F (37.2 C) (Oral)   Resp 17   Ht 5\' 10"  (1.778 m)   Wt 103.7 kg   SpO2 97%   BMI 32.80 kg/m  Filed Weights   01/21/21 0430 01/22/21 0500 01/23/21 0500  Weight: 103 kg 103.7 kg 103.7 kg     - Neuro: alert NAD  - Cardiovascular: sinus  Drips: none      - Pulm: coarse BS   ABG    Component Value Date/Time   PHART 7.276 (L) 01/21/2021 0433   PCO2ART 40.4 01/21/2021 0433   PO2ART 73 (L) 01/21/2021 0433   HCO3 18.7 (L) 01/21/2021 0433   TCO2 20 (L) 01/21/2021 0433   ACIDBASEDEF 7.0 (H) 01/21/2021 0433   O2SAT 92.0 01/21/2021 0433    - Abd: ND - Extremity: warm  .Intake/Output      11/30 0701 12/01 0700 12/01 0701 12/02 0700   P.O. 640    I.V. (mL/kg)     IV Piggyback     Total Intake(mL/kg) 640 (6.2)    Urine (mL/kg/hr) 2550 (1)    Chest Tube     Total Output 2550    Net -1910            _______________________________________________________________ Labs: CBC Latest Ref Rng & Units 01/23/2021 01/22/2021 01/21/2021  WBC 4.0 - 10.5 K/uL 14.5(H) 18.4(H) 18.6(H)  Hemoglobin 13.0 - 17.0 g/dL 9.1(L) 9.9(L) 10.3(L)  Hematocrit 39.0 - 52.0 % 27.0(L) 29.7(L) 30.4(L)  Platelets 150 - 400 K/uL 170 170 167   CMP Latest Ref Rng & Units 01/23/2021 01/22/2021 01/21/2021  Glucose 70 - 99 mg/dL 104(H) 123(H) 122(H)  BUN 8 - 23 mg/dL 18 16 13   Creatinine 0.61 - 1.24 mg/dL 1.26(H) 1.34(H) 1.24  Sodium 135 - 145 mmol/L 134(L)  132(L) 135  Potassium 3.5 - 5.1 mmol/L 3.9 4.1 3.9  Chloride 98 - 111 mmol/L 101 104 108  CO2 22 - 32 mmol/L 27 24 21(L)  Calcium 8.9 - 10.3 mg/dL 8.1(L) 8.2(L) 8.1(L)  Total Protein 6.5 - 8.1 g/dL - - -  Total Bilirubin 0.3 - 1.2 mg/dL - - -  Alkaline Phos 38 - 126 U/L - - -  AST 15 - 41 U/L - - -  ALT 0 - 44 U/L - - -    CXR: -  _______________________________________________________________  Assessment and Plan: POD 3 s/p CABG3  Neuro: pain control CV: On a/s/bb. PO amio.   Pulm: continue pulm hygiene.   Renal: creat down.  Gentle diuresis GI: on diet Heme: stable ID: afebrile Endo: SSI Dispo: floor today   Lajuana Matte 01/23/2021 7:12 AM

## 2021-01-23 NOTE — Progress Notes (Signed)
Mobility Specialist: Progress Note   01/23/21 1638  Mobility  Activity Ambulated in hall  Level of Assistance Minimal assist, patient does 75% or more  Assistive Device Four wheel walker (EVA)  Distance Ambulated (ft) 192 ft  Mobility Ambulated with assistance in hallway  Mobility Response Tolerated well  Mobility performed by Mobility specialist  $Mobility charge 1 Mobility   Pre-Mobility: 72 HR, 97% SpO2 Post-Mobility: 66 HR, 100% SpO2  Pt ambulated on 2 L/min [place]. Pt required minA to sit EOB as well as to stand. Pt walks with slow gait, no c/o throughout. Pt back to bed after walk with call bell at his side. Family members present in the room.   [facility] Ariyannah Pauling Mobility Specialist Mobility Specialist Phone #1: [phone] Mobility Specialist Phone #2: [phone]

## 2021-01-23 NOTE — Progress Notes (Signed)
CARDIAC REHAB PHASE I   PRE:  Rate/Rhythm: 69 SR  BP:  Sitting: 102/60      SaO2: 97 3L  MODE:  Ambulation: 170 ft   POST:  Rate/Rhythm: 80 SR  BP:  Sitting: 104/64    SaO2: 100 3L   Pt agreeable to ambulate. Pt ambulated ax1 with EVA and gait belt. Pt took short shuffling steps with several short standing rest breaks c/o SOB and weakness. Pt returned to bed and c/o nausea. RN aware. Cool rag place on forehead, vitals stable. Encouraged continued IS use and ambulation. Will continue to follow.   [id number] Rufina Falco, RN BSN 01/23/2021 10:30 AM

## 2021-01-24 ENCOUNTER — Inpatient Hospital Stay (HOSPITAL_COMMUNITY): Payer: Medicare Other

## 2021-01-24 LAB — BASIC METABOLIC PANEL
Anion gap: 7 (ref 5–15)
BUN: 23 mg/dL (ref 8–23)
CO2: 24 mmol/L (ref 22–32)
Calcium: 8.3 mg/dL — ABNORMAL LOW (ref 8.9–10.3)
Chloride: 104 mmol/L (ref 98–111)
Creatinine, Ser: 1.31 mg/dL — ABNORMAL HIGH (ref 0.61–1.24)
GFR, Estimated: 56 mL/min — ABNORMAL LOW (ref >60)
Glucose, Bld: 110 mg/dL — ABNORMAL HIGH (ref 70–99)
Potassium: 4.1 mmol/L (ref 3.5–5.1)
Sodium: 135 mmol/L (ref 135–145)

## 2021-01-24 LAB — CBC
HCT: 28.2 % — ABNORMAL LOW (ref 39.0–52.0)
Hemoglobin: 9.4 g/dL — ABNORMAL LOW (ref 13.0–17.0)
MCH: 30.4 pg (ref 26.0–34.0)
MCHC: 33.3 g/dL (ref 30.0–36.0)
MCV: 91.3 fL (ref 80.0–100.0)
Platelets: 226 10*3/uL (ref 150–400)
RBC: 3.09 MIL/uL — ABNORMAL LOW (ref 4.22–5.81)
RDW: 13.2 % (ref 11.5–15.5)
WBC: 13.2 10*3/uL — ABNORMAL HIGH (ref 4.0–10.5)
nRBC: 0 % (ref 0.0–0.2)

## 2021-01-24 MED FILL — Electrolyte-R (PH 7.4) Solution: INTRAVENOUS | Qty: 4000 | Status: AC

## 2021-01-24 MED FILL — Mannitol IV Soln 20%: INTRAVENOUS | Qty: 500 | Status: AC

## 2021-01-24 MED FILL — Calcium Chloride Inj 10%: INTRAVENOUS | Qty: 10 | Status: AC

## 2021-01-24 MED FILL — Sodium Chloride IV Soln 0.9%: INTRAVENOUS | Qty: 2000 | Status: AC

## 2021-01-24 MED FILL — Heparin Sodium (Porcine) Inj 1000 Unit/ML: INTRAMUSCULAR | Qty: 10 | Status: AC

## 2021-01-24 MED FILL — Sodium Bicarbonate IV Soln 8.4%: INTRAVENOUS | Qty: 50 | Status: AC

## 2021-01-24 NOTE — Progress Notes (Signed)
Mobility Specialist: Progress Note   01/24/21 1758  Mobility  Activity Ambulated in hall  Level of Assistance Modified independent, requires aide device or extra time  Assistive Device Front wheel walker  Distance Ambulated (ft) 140 ft  Mobility Ambulated with assistance in hallway  Mobility Response Tolerated well  Mobility performed by Mobility specialist  $Mobility charge 1 Mobility   Post-Mobility: 66 HR  Pt had no c/o during ambulation. Pt sitting EOB after walk with call bell in reach and wife present in the room.   [facility] Arra Connaughton Mobility Specialist Mobility Specialist Phone #1: [phone] Mobility Specialist Phone #2: [phone]

## 2021-01-24 NOTE — Progress Notes (Signed)
CARDIAC REHAB PHASE I   Returned to offer to walk with pt. Pt asleep in the bed. Pts  wife states he had not slept, will allow pt to rest and return for ambulation as time allows.   Rufina Falco, RN BSN 01/24/2021 11:23 AM

## 2021-01-24 NOTE — Care Management Important Message (Signed)
Important Message  Patient Details  Name: Orvis Stann MRN: [id number] Date of Birth: 12/16/1945   Medicare Important Message Given:  Yes     Shelda Altes 01/24/2021, 9:40 AM

## 2021-01-24 NOTE — Progress Notes (Addendum)
[address]       [place] [address]             [phone]      4 Days Post-Op Procedure(s) (LRB): CORONARY ARTERY BYPASS GRAFTING (CABG) TIMES THREE USING LEFT INTERNAL MAMMARY ARTERY AND RIGHT GREATER SAPHENOUS VEIN HARVESTED ENDOSCOPICALLY. (N/A) TRANSESOPHAGEAL ECHOCARDIOGRAM (TEE) (N/A) APPLICATION OF CELL SAVER (N/A) ENDOVEIN HARVEST OF GREATER SAPHENOUS VEIN (Right) Subjective: Poor appetite but improving No specific c/o  Objective: Vital signs in last 24 hours: Temp:  [98 F (36.7 C)-98.6 F (37 C)] 98.4 F (36.9 C) (12/02 0742) Pulse Rate:  [68-80] 68 (12/02 0742) Cardiac Rhythm: Ventricular tachycardia (12/02 0030) Resp:  [12-23] 15 (12/02 0742) BP: (102-126)/(59-99) 119/64 (12/02 0742) SpO2:  [92 %-99 %] 92 % (12/02 0742) Weight:  [104.8 kg] 104.8 kg (12/02 0622)  Hemodynamic parameters for last 24 hours:    Intake/Output from previous day: 12/01 0701 - 12/02 0700 In: -  Out: 200 [Urine:200] Intake/Output this shift: No intake/output data recorded.  General appearance: alert, cooperative, and no distress Heart: regular rate and rhythm Lungs:  dim in bases Abdomen: benign Extremities: minor edema Wound: healing well  Lab Results: Recent Labs    01/23/21 0415 01/24/21 0306  WBC 14.5* 13.2*  HGB 9.1* 9.4*  HCT 27.0* 28.2*  PLT 170 226   BMET:  Recent Labs    01/23/21 0415 01/24/21 0306  NA 134* 135  K 3.9 4.1  CL 101 104  CO2 27 24  GLUCOSE 104* 110*  BUN 18 23  CREATININE 1.26* 1.31*  CALCIUM 8.1* 8.3*    PT/INR: No results for input(s): LABPROT, INR in the last 72 hours. ABG    Component Value Date/Time   PHART 7.276 (L) 01/21/2021 0433   HCO3 18.7 (L) 01/21/2021 0433   TCO2 20 (L) 01/21/2021 0433   ACIDBASEDEF 7.0 (H) 01/21/2021 0433   O2SAT 92.0 01/21/2021 0433   CBG (last 3)  Recent Labs    01/23/21 0411 01/23/21 1133 01/23/21 1655  GLUCAP 96 97 96    Meds Scheduled Meds:  acetaminophen   1,000 mg Oral Q6H   Or   acetaminophen (TYLENOL) oral liquid 160 mg/5 mL  1,000 mg Per Tube Q6H   amiodarone  200 mg Oral BID   arformoterol  15 mcg Nebulization BID   aspirin EC  325 mg Oral Daily   Or   aspirin  324 mg Per Tube Daily   bisacodyl  10 mg Oral Daily   Or   bisacodyl  10 mg Rectal Daily   Chlorhexidine Gluconate Cloth  6 each Topical Daily   docusate sodium  200 mg Oral Daily   enoxaparin (LOVENOX) injection  30 mg Subcutaneous QHS   feeding supplement  237 mL Oral BID BM   furosemide  40 mg Oral Daily   levothyroxine  12.5 mcg Oral QAC breakfast   mouth rinse  15 mL Mouth Rinse BID   metoprolol tartrate  25 mg Oral BID   midodrine  5 mg Oral TID WC   pantoprazole  40 mg Oral Daily   PARoxetine  10 mg Oral BID AC & HS   revefenacin  175 mcg Nebulization Daily   rosuvastatin  40 mg Oral Daily   sodium chloride flush  10-40 mL Intracatheter Q12H   sodium chloride flush  3 mL Intravenous Q12H   sodium chloride flush  3 mL Intravenous Q12H   Continuous Infusions:  sodium  chloride Stopped (01/21/21 1615)   sodium chloride     sodium chloride     sodium chloride     lactated ringers     lactated ringers     lactated ringers 20 mL/hr at 01/21/21 2000   PRN Meds:.sodium chloride, sodium chloride, guaiFENesin, lactated ringers, metoprolol tartrate, morphine injection, ondansetron (ZOFRAN) IV, oxyCODONE, sodium chloride flush, sodium chloride flush, sodium chloride flush, traMADol  Xrays No results found.  Assessment/Plan: S/P Procedure(s) (LRB): CORONARY ARTERY BYPASS GRAFTING (CABG) TIMES THREE USING LEFT INTERNAL MAMMARY ARTERY AND RIGHT GREATER SAPHENOUS VEIN HARVESTED ENDOSCOPICALLY. (N/A) TRANSESOPHAGEAL ECHOCARDIOGRAM (TEE) (N/A) APPLICATION OF CELL SAVER (N/A) ENDOVEIN HARVEST OF GREATER SAPHENOUS VEIN (Right)   1 afeb, VSS s BP 100-120's , sinus with PVC's, no further afib- cont amio 2 sats ok on RA 3 voiding well, UOP not accurate, weight up 9 kg if  accurate?cont current diuretics 4 creat 1.31- fairly stable over last 4 days 5 leukocytosis trending down 6 expected ABLA fairly stable  7 f/u CXR in am 8 push nutrition.rehab and pulm hygiene- hopefully home in 1-2 days       LOS: 4 days    Hector Giovanni PA-C Pager [phone] 01/24/2021    Agree with above.  Overall doing well.  We will plan for discharge tomorrow.  Hector Morris

## 2021-01-24 NOTE — Progress Notes (Signed)
Mobility Specialist: Progress Note   01/24/21 1424  Mobility  Activity Ambulated in hall  Level of Assistance Modified independent, requires aide device or extra time  Assistive Device Front wheel walker  Distance Ambulated (ft) 260 ft  Mobility Ambulated with assistance in hallway  Mobility Response Tolerated well  Mobility performed by Mobility specialist  $Mobility charge 1 Mobility   Pre-Mobility: 64 HR Post-Mobility: 68 HR  Pt independent to stand and mod I during ambulation. Pt had no c/o throughout. Pt to BR after walk with pt's wife present in the room.   [name] Hector Morris Mobility Specialist Mobility Specialist Phone #1: [phone] Mobility Specialist Phone #2: [phone]

## 2021-01-24 NOTE — Progress Notes (Signed)
CARDIAC REHAB PHASE I   Offered to walk with pt. Pt states he just finished getting bathed, requesting time to rest. Pt assisted to bed. Wife at bedside. Will f/u to ambulate. Pt looks stronger, and feels better today.  [phone] Rufina Falco, RN BSN 01/24/2021 10:22 AM

## 2021-01-25 LAB — BASIC METABOLIC PANEL
Anion gap: 7 (ref 5–15)
BUN: 27 mg/dL — ABNORMAL HIGH (ref 8–23)
CO2: 25 mmol/L (ref 22–32)
Calcium: 8.2 mg/dL — ABNORMAL LOW (ref 8.9–10.3)
Chloride: 101 mmol/L (ref 98–111)
Creatinine, Ser: 1.47 mg/dL — ABNORMAL HIGH (ref 0.61–1.24)
GFR, Estimated: 49 mL/min — ABNORMAL LOW (ref >60)
Glucose, Bld: 99 mg/dL (ref 70–99)
Potassium: 3.7 mmol/L (ref 3.5–5.1)
Sodium: 133 mmol/L — ABNORMAL LOW (ref 135–145)

## 2021-01-25 MED ORDER — ASPIRIN 325 MG PO TBEC: 325.0000 mg | DELAYED_RELEASE_TABLET | Freq: Every day | ORAL | Status: AC

## 2021-01-25 MED ORDER — METOPROLOL TARTRATE 25 MG PO TABS: 25.0000 mg | ORAL_TABLET | Freq: Two times a day (BID) | ORAL | 1 refills | Status: AC

## 2021-01-25 MED ORDER — AMIODARONE HCL 200 MG PO TABS: 200.0000 mg | ORAL_TABLET | Freq: Two times a day (BID) | ORAL | 1 refills | Status: DC

## 2021-01-25 MED ORDER — MIDODRINE HCL 5 MG PO TABS: 5.0000 mg | ORAL_TABLET | Freq: Three times a day (TID) | ORAL | 0 refills | Status: DC

## 2021-01-25 MED ORDER — OXYCODONE HCL 5 MG PO TABS: 5.0000 mg | ORAL_TABLET | Freq: Four times a day (QID) | ORAL | 0 refills | Status: DC | PRN

## 2021-01-25 NOTE — Progress Notes (Signed)
Discharged to home with family office visits in place teaching done  

## 2021-01-25 NOTE — Progress Notes (Addendum)
CARDIAC REHAB PHASE I   PRE:  Rate/Rhythm: SR / 73  BP:  Sitting: 121/90      SaO2: 94  MODE:  Ambulation: 250 ft SAO2 = 93-94%  POST:  Rate/Rhythm: SR /75  BP:  Sitting: 114/66      SaO2: 95%  Received pt in bed, spouse in room. Provided education S/P CABG. Pt has post OHS booklet. Reviewed self care, wound care, precautions. Stressed the "move in the tube" concept. Discussed exercise guidelines and S/S to report yo MD. Daily weights encouraged. Medication compliance and f/u with MD encouraged. Reviewed RF education for HTN, Lipids, sedentary lifestyle. Provided handouts on heart health diet, low Na diet, and exercise guidelines. Stressed use of I/S at home. Pt pulled 1000 mL on I/S. Pt and spouse verbalized understanding of the education and all questions were answered. Pt referral to [facility] in [place], [place]. Pt ambulated with slow, steady gait using rolling walker. Pt denied SOB, dizziness or pain. Pt back to bed with spouse at side. Primary RN notified.    Lesly Rubenstein, MS, ACSM EP-C, [facility] 01/25/2021 9:35-10:57

## 2021-01-25 NOTE — Progress Notes (Addendum)
[address]       [place] [address]             [phone]      5 Days Post-Op Procedure(s) (LRB): CORONARY ARTERY BYPASS GRAFTING (CABG) TIMES THREE USING LEFT INTERNAL MAMMARY ARTERY AND RIGHT GREATER SAPHENOUS VEIN HARVESTED ENDOSCOPICALLY. (N/A) TRANSESOPHAGEAL ECHOCARDIOGRAM (TEE) (N/A) APPLICATION OF CELL SAVER (N/A) ENDOVEIN HARVEST OF GREATER SAPHENOUS VEIN (Right) Subjective: Feels well anxious to go home  Objective: Vital signs in last 24 hours: Temp:  [97.4 F (36.3 C)-98.6 F (37 C)] 97.8 F (36.6 C) (12/03 0505) Pulse Rate:  [64-69] 69 (12/02 2050) Cardiac Rhythm: Normal sinus rhythm (12/02 1902) Resp:  [14-18] 18 (12/02 2344) BP: (100-146)/(51-75) 116/66 (12/03 0505) SpO2:  [94 %-96 %] 94 % (12/02 2344) Weight:  [99.3 kg] 99.3 kg (12/02 2344)  Hemodynamic parameters for last 24 hours:    Intake/Output from previous day: No intake/output data recorded. Intake/Output this shift: No intake/output data recorded.  General appearance: alert, cooperative, and no distress Heart: regular rate and rhythm Lungs: clear to auscultation bilaterally Abdomen: benign Extremities: minor edema Wound: incis healing well  Lab Results: Recent Labs    01/23/21 0415 01/24/21 0306  WBC 14.5* 13.2*  HGB 9.1* 9.4*  HCT 27.0* 28.2*  PLT 170 226   BMET:  Recent Labs    01/24/21 0306 01/25/21 0158  NA 135 133*  K 4.1 3.7  CL 104 101  CO2 24 25  GLUCOSE 110* 99  BUN 23 27*  CREATININE 1.31* 1.47*  CALCIUM 8.3* 8.2*    PT/INR: No results for input(s): LABPROT, INR in the last 72 hours. ABG    Component Value Date/Time   PHART 7.276 (L) 01/21/2021 0433   HCO3 18.7 (L) 01/21/2021 0433   TCO2 20 (L) 01/21/2021 0433   ACIDBASEDEF 7.0 (H) 01/21/2021 0433   O2SAT 92.0 01/21/2021 0433   CBG (last 3)  Recent Labs    01/23/21 0411 01/23/21 1133 01/23/21 1655  GLUCAP 96 97 96    Meds Scheduled Meds:  acetaminophen  1,000 mg Oral  Q6H   Or   acetaminophen (TYLENOL) oral liquid 160 mg/5 mL  1,000 mg Per Tube Q6H   amiodarone  200 mg Oral BID   arformoterol  15 mcg Nebulization BID   aspirin EC  325 mg Oral Daily   Or   aspirin  324 mg Per Tube Daily   bisacodyl  10 mg Oral Daily   Or   bisacodyl  10 mg Rectal Daily   docusate sodium  200 mg Oral Daily   enoxaparin (LOVENOX) injection  30 mg Subcutaneous QHS   feeding supplement  237 mL Oral BID BM   furosemide  40 mg Oral Daily   levothyroxine  12.5 mcg Oral QAC breakfast   mouth rinse  15 mL Mouth Rinse BID   metoprolol tartrate  25 mg Oral BID   midodrine  5 mg Oral TID WC   pantoprazole  40 mg Oral Daily   PARoxetine  10 mg Oral BID AC & HS   revefenacin  175 mcg Nebulization Daily   rosuvastatin  40 mg Oral Daily   sodium chloride flush  10-40 mL Intracatheter Q12H   sodium chloride flush  3 mL Intravenous Q12H   sodium chloride flush  3 mL Intravenous Q12H   Continuous Infusions:  sodium chloride Stopped (01/21/21 1615)   sodium chloride     sodium chloride  sodium chloride     lactated ringers     lactated ringers     lactated ringers 20 mL/hr at 01/21/21 2000   PRN Meds:.sodium chloride, sodium chloride, guaiFENesin, lactated ringers, metoprolol tartrate, morphine injection, ondansetron (ZOFRAN) IV, oxyCODONE, sodium chloride flush, sodium chloride flush, sodium chloride flush, traMADol  Xrays DG Chest 2 View  Result Date: 01/24/2021 CLINICAL DATA:  Status post CABG EXAM: CHEST - 2 VIEW COMPARISON:  01/22/2021 FINDINGS: Interval removal of right neck vascular catheter and chest and mediastinal drainage tubes. Unchanged small left pleural effusion with associated atelectasis. No pneumothorax. Unchanged mild diffuse interstitial pulmonary opacity. Cardiomegaly status post median sternotomy and CABG. IMPRESSION: 1. Interval removal of right neck vascular catheter and chest and mediastinal drainage tubes. 2. Unchanged small left pleural effusion  with associated atelectasis. No pneumothorax. 3. Unchanged mild diffuse interstitial pulmonary opacity. 4. Cardiomegaly status post median sternotomy and CABG. Electronically Signed   By: Delanna Ahmadi M.D.   On: 01/24/2021 12:38    Assessment/Plan: S/P Procedure(s) (LRB): CORONARY ARTERY BYPASS GRAFTING (CABG) TIMES THREE USING LEFT INTERNAL MAMMARY ARTERY AND RIGHT GREATER SAPHENOUS VEIN HARVESTED ENDOSCOPICALLY. (N/A) TRANSESOPHAGEAL ECHOCARDIOGRAM (TEE) (N/A) APPLICATION OF CELL SAVER (N/A) ENDOVEIN HARVEST OF GREATER SAPHENOUS VEIN (Right)   1 afeb, VSS s BP 100-146,higher lately, I think we can stop midodrine . sinus rhythm 2 sats good on RA 3 good UOP- weight below preop 4 creat up to 1.47 today. Minor hyponatremia- will d/c lasix 5 stable for discharge  LOS: 5 days    Hector Giovanni PA-C Pager [phone] 01/25/2021

## 2021-01-31 ENCOUNTER — Other Ambulatory Visit: Payer: Self-pay

## 2021-01-31 ENCOUNTER — Telehealth (HOSPITAL_COMMUNITY): Payer: Self-pay

## 2021-01-31 ENCOUNTER — Ambulatory Visit (INDEPENDENT_AMBULATORY_CARE_PROVIDER_SITE_OTHER): Payer: Self-pay | Admitting: Thoracic Surgery (Cardiothoracic Vascular Surgery)

## 2021-01-31 DIAGNOSIS — Z951 Presence of aortocoronary bypass graft: Secondary | ICD-10-CM

## 2021-01-31 NOTE — Progress Notes (Signed)
     [address]       [place] [address]             [phone]       Patient: Home Provider: Office Consent for Telemedicine visit obtained.  Today's visit was completed via a real-time telehealth (see specific modality noted below). The patient/authorized person provided oral consent at the time of the visit to engage in a telemedicine encounter with the present provider at [facility]. The patient/authorized person was informed of the potential benefits, limitations, and risks of telemedicine. The patient/authorized person expressed understanding that the laws that protect confidentiality also apply to telemedicine. The patient/authorized person acknowledged understanding that telemedicine does not provide emergency services and that he or she would need to call 911 or proceed to the nearest hospital for help if such a need arose.   Total time spent in the clinical discussion 10 minutes.  Telehealth Modality: Phone visit (audio only)  I had a telephone visit with  Hector Morris who is s/p CABG.  Overall doing well.   Pain is minimal.  Ambulating well. Vitals have been  stable.  Hector Morris will see [place] back in 1 month with a chest x-ray for cardiac rehab clearance.  Hector Morris

## 2021-01-31 NOTE — Telephone Encounter (Signed)
Per phase I cardiac rehab, fax cardiac rehab referral to [facility] cardiac rehab.

## 2021-02-03 NOTE — Progress Notes (Signed)
Cardiology Office Note:    Date:  02/03/2021   ID:  Hector Morris, DOB [birth year], MRN [id number]  PCP:  Caren Macadam, MD   [facility] Providers Cardiologist:  Pixie Casino, MD      Referring MD: Caren Macadam, MD   Status post CABG x3 on 01/20/21  History of Present Illness:    Hector Morris is a 75 y.o. male with a hx of coronary artery disease, thoracic aortic aneurysm, abdominal aortic aneurysm status postrepair 04/21/2017, angina, paroxysmal atrial fibrillation, obstructive sleep apnea, hyperlipidemia, right knee pain, CABG x3 on 01/20/2021 (Dr. Kipp Brood).  He noted increased exertional dyspnea and anginal symptoms over the past year.  He underwent cardiac catheterization which showed severe three-vessel coronary artery disease.  He was seen by Dr. Kipp Brood in the office for potential coronary artery bypass grafting.  His echocardiogram showed an EF of 45-50%, mitral valve mild-moderate regurgitation, trivial aortic insufficiency with no stenosis, ascending aortic dilation 44 mm.  His aortic root was noted to be 38 mm.  He presented to the hospital on 01/20/2021 and was taken to the operating room.  He underwent three-vessel bypass (SVG-posterior descending artery, SVG-marginal coronary arteries x2.  He was transferred to the cardiac ICU in stable condition.  He received dobutamine to augment as blood pressure early postoperatively.  His dobutamine was weaned and i his hemodynamics remained stable.  He was placed on midodrine postoperatively day 1.  He developed atrial fibrillation with RVR and was treated with IV amiodarone.  This was later transitioned to p.o. amiodarone on his first day postoperatively.  His pacing wires were removed and postop day 2.  He was noted to have mild AKI.  His hemoglobin 12-22 was 9.4.  He progressed with his physical activity/cardiac rehab.  He was tolerating his diet.  His incision site showed no sign of infection.  He  continued to progress well and was felt to be stable from a cardiac standpoint at discharge on 01/20/2021.  He plans to attend cardiac rehab in [place].  He presents to the clinic today for follow-up evaluation states he feels well today.  He reports that he had a bit of tiredness yesterday.  He has 4 levels to his house and is required to walk several sets of stairs.  He has been walking 2-3 times per day both inside and outside.  He reports some occasional sharp pains but reports that overall he has not had any chest pain.  He is very pleased with his outcome.  We discussed the importance of cardiac rehab.  He also reports some constipation.  We discussed the importance of high-fiber diet and adequate hydration.  He expressed understanding.  We reviewed his EKG today which shows normal sinus rhythm with left axis deviation.  He denies episodes of irregular or accelerated heart rate.  He continues to use his incentive spirometer regularly.  I will give him a salty 6 diet sheet, have him maintain his physical activity, and plan follow-up in 3 months.  Today he denies chest pain, shortness of breath, lower extremity edema, fatigue, palpitations, melena, hematuria, hemoptysis, diaphoresis, weakness, presyncope, syncope, orthopnea, and PND.   Past Medical History:  Diagnosis Date   AAA (abdominal aortic aneurysm)    s/p EVAR 04/21/17   Allergy    Anxiety    Arthritis    Coronary artery disease    Depression    Dyspnea    with exertion   GERD (gastroesophageal reflux  disease)    History of hiatal hernia    History of kidney stones    Hx of colonic polyps 03/31/2011   Hyperlipidemia    Hypothyroidism    Neuromuscular disorder ([place])    hiatal hernia   Sleep apnea    cpap   Vertigo     Past Surgical History:  Procedure Laterality Date   ABDOMINAL AORTIC ENDOVASCULAR STENT GRAFT N/A 04/21/2017   Procedure: ABDOMINAL AORTIC ENDOVASCULAR STENT GRAFT;  Surgeon: Serafina Mitchell, MD;   Location: [place];  Service: Vascular;  Laterality: N/A;   APPENDECTOMY     CHOLECYSTECTOMY     COLONOSCOPY     last 05/2016   CORONARY ARTERY BYPASS GRAFT N/A 01/20/2021   Procedure: CORONARY ARTERY BYPASS GRAFTING (CABG) TIMES THREE USING LEFT INTERNAL MAMMARY ARTERY AND RIGHT GREATER SAPHENOUS VEIN HARVESTED ENDOSCOPICALLY.;  Surgeon: Lajuana Matte, MD;  Location: [place];  Service: Open Heart Surgery;  Laterality: N/A;   ELBOW SURGERY Left    as teenager   ENDOVEIN HARVEST OF GREATER SAPHENOUS VEIN Right 01/20/2021   Procedure: ENDOVEIN HARVEST OF GREATER SAPHENOUS VEIN;  Surgeon: Lajuana Matte, MD;  Location: [place];  Service: Open Heart Surgery;  Laterality: Right;   EYE SURGERY Bilateral 05/21/2020   Cataract Surgery   HERNIA REPAIR Right    inguinal   INTRAVASCULAR PRESSURE WIRE/FFR STUDY N/A 01/02/2021   Procedure: INTRAVASCULAR PRESSURE WIRE/FFR STUDY;  Surgeon: Martinique, Peter M, MD;  Location: [place] [facility];  Service: Cardiovascular;  Laterality: N/A;   KNEE SURGERY Right    scope - cartilage removed   LEFT HEART CATH AND CORONARY ANGIOGRAPHY N/A 01/02/2021   Procedure: LEFT HEART CATH AND CORONARY ANGIOGRAPHY;  Surgeon: Martinique, Peter M, MD;  Location: [place] [facility];  Service: Cardiovascular;  Laterality: N/A;   POLYPECTOMY  2018   TEE WITHOUT CARDIOVERSION N/A 01/20/2021   Procedure: TRANSESOPHAGEAL ECHOCARDIOGRAM (TEE);  Surgeon: Lajuana Matte, MD;  Location: [place];  Service: Open Heart Surgery;  Laterality: N/A;   TONSILLECTOMY      Current Medications: No outpatient medications have been marked as taking for the 02/05/21 encounter (Appointment) with Deberah Pelton, NP.     Allergies:   Bee venom, Penicillins, Shellfish allergy, and Latex   Social History   Socioeconomic History   Marital status: Married    Spouse name: Not on file   Number of children: Not on file   Years of education: Not on file   Highest education level: Not on file   Occupational History   Not on file  Tobacco Use   Smoking status: Former    Packs/day: 1.50    Years: 39.00    Pack years: 58.50    Types: Cigarettes    Quit date: 02/23/2006    Years since quitting: 14.9   Smokeless tobacco: Former    Types: Nurse, children's Use: Never used  Substance and Sexual Activity   Alcohol use: Yes    Comment: occasional   Drug use: No   Sexual activity: Not on file  Other Topics Concern   Not on file  Social History Narrative   Not on file   Social Determinants of Health   Financial Resource Strain: Not on file  Food Insecurity: Not on file  Transportation Needs: Not on file  Physical Activity: Not on file  Stress: Not on file  Social Connections: Not on file     Family History: The  patient's family history includes Heart attack in his father; Heart murmur in his mother; Obesity in his brother; Stroke in his sister. There is no history of Colon cancer or Colon polyps.  ROS:   Please see the history of present illness.     All other systems reviewed and are negative.   Risk Assessment/Calculations:           Physical Exam:    VS:  There were no vitals taken for this visit.    Wt Readings from Last 3 Encounters:  01/24/21 219 lb (99.3 kg)  01/15/21 215 lb 1.6 oz (97.6 kg)  01/10/21 220 lb (99.8 kg)     GEN:  Well nourished, well developed in no acute distress HEENT: Normal NECK: No JVD; No carotid bruits LYMPHATICS: No lymphadenopathy CARDIAC: RRR, no murmurs, rubs, gallops RESPIRATORY:  Clear to auscultation without rales, wheezing or rhonchi  ABDOMEN: Soft, non-tender, non-distended MUSCULOSKELETAL:  No edema; No deformity  SKIN: Warm and dry NEUROLOGIC:  Alert and oriented x 3 PSYCHIATRIC:  Normal affect    EKGs/Labs/Other Studies Reviewed:    The following studies were reviewed today:  Echocardiogram 01/10/2021 IMPRESSIONS     1. Hypokinesis of the inferolateral and basal inferior wall with overall   mild LV dysfunction.   2. Left ventricular ejection fraction, by estimation, is 45 to 50%. The  left ventricle has mildly decreased function. The left ventricle  demonstrates regional wall motion abnormalities (see scoring  diagram/findings for description). There is moderate  left ventricular hypertrophy. Left ventricular diastolic parameters are  consistent with Grade I diastolic dysfunction (impaired relaxation).   3. Right ventricular systolic function is normal. The right ventricular  size is normal. There is normal pulmonary artery systolic pressure.   4. The mitral valve is normal in structure. Mild to moderate mitral valve  regurgitation. No evidence of mitral stenosis.   5. The aortic valve is tricuspid. Aortic valve regurgitation is trivial.  Aortic valve sclerosis is present, with no evidence of aortic valve  stenosis.   6. Aortic dilatation noted. There is borderline dilatation of the aortic  root, measuring 38 mm. There is mild dilatation of the ascending aorta,  measuring 44 mm.   7. The inferior vena cava is normal in size with greater than 50%  respiratory variability, suggesting right atrial pressure of 3 mmHg.   Comparison(s): No prior Echocardiogram  Cardiac catheterization 01/02/2021   Ost RCA to Prox RCA lesion is 99% stenosed.   Prox RCA to Mid RCA lesion is 100% stenosed.   Prox LAD lesion is 70% stenosed with 0% stenosed side branch in 1st Sept.   1st Mrg lesion is 90% stenosed.   Prox Cx to Mid Cx lesion is 90% stenosed.   There is mild left ventricular systolic dysfunction.   LV end diastolic pressure is mildly elevated.   The left ventricular ejection fraction is 45-50% by visual estimate.   Severe 3 vessel obstructive CAD with heavily calcified coronaries. There is a 70% stenosis of the LAD at the takeoff of the first diagonal with abnormal RFR of 0.86. there is 90% stenosis in a large first OM and in the continuation of the LCx. The RCA is chronically  occluded proximally with good left to right collaterals. Mild LV dysfunction EF estimated at 45% with anterior hypokinesis Mildly elevated LVEDP 22 mm Hg   Plan: referral to CT surgery for consideration of CABG  Diagnostic Dominance: Right Intervention  EKG:  EKG is  ordered today.  The ekg ordered today demonstrates normal sinus rhythm left axis deviation 77 bpm  Recent Labs: 08/16/2020: TSH 4.06 01/15/2021: ALT 18 01/21/2021: Magnesium 2.0 01/24/2021: Hemoglobin 9.4; Platelets 226 01/25/2021: BUN 27; Creatinine, Ser 1.47; Potassium 3.7; Sodium 133  Recent Lipid Panel    Component Value Date/Time   CHOL 146 05/03/2020 1052   CHOL 129 06/28/2017 0823   TRIG 279.0 (H) 05/03/2020 1052   HDL 35.00 (L) 05/03/2020 1052   HDL 32 (L) 06/28/2017 0823   CHOLHDL 4 05/03/2020 1052   VLDL 55.8 (H) 05/03/2020 1052   LDLCALC 77 11/01/2019 1017   LDLDIRECT 71.0 05/03/2020 1052    ASSESSMENT & PLAN   Coronary artery disease-denies recent episodes of arm neck back or chest discomfort.  Underwent CABG x3 on 01/20/2021.  SVG-PDA, SVG-obtuse marginal x2.  Has been slowly increasing his physical activity and is walking several times per day.  Sternal incision and chest tube sites continue to heal well.  No signs of infection. Continue aspirin, metoprolol, and rosuvastatin Heart healthy low-sodium diet-salty 6 given Increase physical activity as tolerated-maintain sternal precautions  Hyperlipidemia-05/03/2020: Cholesterol 146; HDL 35.00; Triglycerides 279.0; VLDL 55.8 Continue aspirin, rosuvastatin Heart healthy low-sodium high-fiber diet Increase physical activity as tolerated  Paroxysmal atrial fibrillation-EKG today shows normal sinus rhythm left axis deviation 77 bpm.  No recent episodes of accelerated or irregular heartbeat. Continue amiodarone, aspirin Heart healthy low-sodium diet-salty 6 given Increase physical activity as tolerated  OSA-reports compliance with CPAP. Continue CPAP  use Follows with PCP  Disposition: Follow-up with Dr. Debara Pickett in 3 months.        Medication Adjustments/Labs and Tests Ordered: Current medicines are reviewed at length with the patient today.  Concerns regarding medicines are outlined above.  No orders of the defined types were placed in this encounter.  No orders of the defined types were placed in this encounter.   There are no Patient Instructions on file for this visit.   Signed, Deberah Pelton, NP  02/03/2021 1:41 PM      Notice: This dictation was prepared with Dragon dictation along with smaller phrase technology. Any transcriptional errors that result from this process are unintentional and may not be corrected upon review.  I spent 13 minutes examining this patient, reviewing medications, and using patient centered shared decision making involving her cardiac care.  Prior to her visit I spent greater than 20 minutes reviewing her past medical history,  medications, and prior cardiac tests.

## 2021-02-05 ENCOUNTER — Other Ambulatory Visit: Payer: Self-pay

## 2021-02-05 ENCOUNTER — Ambulatory Visit (INDEPENDENT_AMBULATORY_CARE_PROVIDER_SITE_OTHER): Payer: Medicare Other | Admitting: General Practice

## 2021-02-05 ENCOUNTER — Encounter (HOSPITAL_BASED_OUTPATIENT_CLINIC_OR_DEPARTMENT_OTHER): Payer: Self-pay | Admitting: General Practice

## 2021-02-05 VITALS — BP 108/62 | HR 77 | Ht 74.0 in | Wt 206.6 lb

## 2021-02-05 DIAGNOSIS — I25119 Atherosclerotic heart disease of native coronary artery with unspecified angina pectoris: Secondary | ICD-10-CM | POA: Diagnosis not present

## 2021-02-05 DIAGNOSIS — I480 Paroxysmal atrial fibrillation: Secondary | ICD-10-CM

## 2021-02-05 DIAGNOSIS — G4733 Obstructive sleep apnea (adult) (pediatric): Secondary | ICD-10-CM

## 2021-02-05 DIAGNOSIS — E785 Hyperlipidemia, unspecified: Secondary | ICD-10-CM

## 2021-02-05 NOTE — Patient Instructions (Signed)
Medication Instructions:  Your Physician recommend you continue on your current medication as directed.    *If you need a refill on your cardiac medications before your next appointment, please call your pharmacy*   Lab Work: None ordered today     Testing/Procedures: None ordered today    Follow-Up: At [facility], you and your health needs are our priority.  As part of our continuing mission to provide you with exceptional heart care, we have created designated Provider Care Teams.  These Care Teams include your primary Cardiologist (physician) and Advanced Practice Providers (APPs -  Physician Assistants and Nurse Practitioners) who all work together to provide you with the care you need, when you need it.  We recommend signing up for the patient portal called "MyChart".  Sign up information is provided on this After Visit Summary.  MyChart is used to connect with patients for Virtual Visits (Telemedicine).  Patients are able to view lab/test results, encounter notes, upcoming appointments, etc.  Non-urgent messages can be sent to your provider as well.   To learn more about what you can do with MyChart, go to [email or web address].    Your next appointment:   3 month(s)  The format for your next appointment:   In Person  Provider:   Pixie Casino, MD     Other Instructions  Continue using you CPAP   American Heart Association Recommendations for Physical Activity in Adults  Are you fitting in at least 150 minutes (2.5 hours) of heart-pumping physical activity per week? If not, youre not alone. Only about one in five adults and teens get enough exercise to maintain good health. Being more active can help all people think, feel and sleep better and perform daily tasks more easily. And if youre sedentary, sitting less is a great place to start.  These recommendations are based on the Physical Activity Guidelines for Americans, 2nd edition, published by the U.S.  Department of Health and Financial controller, Office of Disease Prevention and Health Promotion. They recommend how much physical activity we need to be healthy. The guidelines are based on current scientific evidence supporting the connections between physical activity, overall health and well-being, disease prevention and quality of life.  AHA Physical Activity Recommendations for Adults     Get at least 150 minutes per week of moderate-intensity aerobic activity or 75 minutes per week of vigorous aerobic activity, or a combination of both, preferably spread throughout the week.  Add moderate- to high-intensity muscle-strengthening activity (such as resistance or weights) on at least 2 days per week. Spend less time sitting. Even light-intensity activity can offset some of the risks of being sedentary.  Gain even more benefits by being active at least 300 minutes (5 hours) per week. Increase amount and intensity gradually over time.   What is intensity?  Physical activity is anything that moves your body and burns calories. This includes things like walking, climbing stairs and stretching.  Aerobic (or cardio) activity gets your heart rate up and benefits your heart by improving cardiorespiratory fitness. When done at moderate intensity, your heart will beat faster and youll breathe harder than normal, but youll still be able to talk. Think of it as a medium or moderate amount of effort.  Examples of moderate-intensity aerobic activities:  brisk walking (at least 2.5 miles per hour) water aerobics dancing (ballroom or social) gardening tennis (doubles) biking slower than 10 miles per hour Vigorous intensity activities will push your body a little further. They will  require a higher amount of effort. Youll probably get warm and begin to sweat. You wont be able to talk much without getting out of breath.  Examples of vigorous-intensity aerobic activities:  hiking uphill or with a  heavy backpack running swimming laps aerobic dancing heavy yardwork like continuous digging or hoeing tennis (singles) cycling 10 miles per hour or faster jumping rope Knowing your target heart rate can also help you track the intensity of your activities.  For maximum benefits, include both moderate- and vigorous-intensity activity in your routine along with strengthening and stretching exercises.  What if Im just starting to get active?  Dont worry if you cant reach 150 minutes per week just yet. Everyone has to start somewhere. Even if you've been sedentary for years, today is the day you can begin to make healthy changes in your life. Set a reachable goal for today. You can work up toward the recommended amount by increasing your time as you get stronger. Don't let all-or-nothing thinking keep you from doing what you can every day.  The simplest way to get moving and improve your health is to start walking. It's free, easy and can be done just about anywhere, even in place.  Any amount of movement is better than none. And you can break it up into short bouts of activity throughout the day. Taking a brisk walk for five or ten minutes a few times a day will add up.  If you have a chronic condition or disability, talk with your healthcare provider about what types and amounts of physical activity are right for you before making too many changes. But dont wait! Get started today by simply sitting less and moving more, whatever that looks like for you.  The takeaway:  Move more, with more intensity, and sit less. Science has linked being inactive and sitting too much with higher risk of heart disease, type 2 diabetes, colon and lung cancers, and early death.  Its clear that being more active benefits everyone and helps [place] live longer, healthier lives.  Here are some of the big wins:  Lower risk of heart disease, stroke, type 2 diabetes, high blood pressure, dementia and Alzheimers,  several types of cancer, and some complications of pregnancy\  Better sleep, including improvements in insomnia and obstructive sleep apnea  Improved cognition, including memory, attention and processing speed  Less weight gain, obesity and related chronic health conditions  Better bone health and balance, with less risk of injury from falls  Fewer symptoms of depression and anxiety  Better quality of life and sense of overall well-being

## 2021-02-10 ENCOUNTER — Telehealth: Payer: Self-pay

## 2021-02-10 NOTE — Telephone Encounter (Signed)
Transition Care Management Unsuccessful Follow-up Telephone Call  Date of discharge and from where:  01/25/2021 [facility]  Attempts:  1st Attempt  Reason for unsuccessful TCM follow-up call:  No answer/busy  Tomasa Rand, RN, BSN, CEN [place] Coordinator [phone]

## 2021-02-12 ENCOUNTER — Ambulatory Visit: Payer: Medicare Other | Admitting: Internal Medicine

## 2021-02-18 ENCOUNTER — Telehealth: Payer: Self-pay | Admitting: *Deleted

## 2021-02-18 NOTE — Telephone Encounter (Signed)
Transition Care Management Follow-up Telephone Call Date of discharge and from where: 01/25/21 [facility] How have you been since you were released from the hospital? "Doing OK except for mild tremors of both hands and urination hesitancy" Any questions or concerns? Yes- see note above  Items Reviewed: Did the pt receive and understand the discharge instructions provided? Yes  Medications obtained and verified? Yes  Other? No  Any new allergies since your discharge? No  Dietary orders reviewed? Yes Do you have support at home? Yes   Home Care and Equipment/Supplies: Were home health services ordered? no If so, what is the name of the agency? Not applicable  Has the agency set up a time to come to the patient's home? not applicable Were any new equipment or medical supplies ordered?  No What is the name of the medical supply agency? Not Applicable Were you able to get the supplies/equipment? no Do you have any questions related to the use of the equipment or supplies? Not applicable  Functional Questionnaire: (I = Independent and D = Dependent) ADLs: I  Bathing/Dressing- I  Meal Prep- I  Eating- I  Maintaining continence- I  Transferring/Ambulation- I  Managing Meds- I  Follow up appointments reviewed:  PCP Hospital f/u appt confirmed? No  Encouraged patient to make f/u appointment with PCP to discuss hand tremors and urinary hesitancy- patient said he would discuss with cardiac surgeon.  [facility] f/u appt confirmed? Yes  Scheduled to see Dr Kipp Brood  on 03/04/21 @ 1230 pm. Are transportation arrangements needed? No  If their condition worsens, is the pt aware to call PCP or go to the Emergency Dept.? Yes Was the patient provided with contact information for the PCP's office or ED? Yes Was to pt encouraged to call back with questions or concerns? Yes  Kelli Churn RN, CCM, [facility] [place] Care Management Coordinator -  Managed [place] [facility] [phone]

## 2021-02-25 ENCOUNTER — Other Ambulatory Visit: Payer: Self-pay | Admitting: Thoracic Surgery (Cardiothoracic Vascular Surgery)

## 2021-02-25 DIAGNOSIS — Z951 Presence of aortocoronary bypass graft: Secondary | ICD-10-CM

## 2021-03-04 ENCOUNTER — Other Ambulatory Visit: Payer: Self-pay

## 2021-03-04 ENCOUNTER — Other Ambulatory Visit: Payer: Self-pay | Admitting: *Deleted

## 2021-03-04 ENCOUNTER — Ambulatory Visit
Admission: RE | Admit: 2021-03-04 | Discharge: 2021-03-04 | Disposition: A | Discharge status: Home / Self Care | Payer: Medicare Other | Source: Ambulatory Visit | Attending: Thoracic Surgery (Cardiothoracic Vascular Surgery) | Admitting: Thoracic Surgery (Cardiothoracic Vascular Surgery)

## 2021-03-04 ENCOUNTER — Ambulatory Visit (INDEPENDENT_AMBULATORY_CARE_PROVIDER_SITE_OTHER): Payer: Self-pay | Admitting: Surgical

## 2021-03-04 VITALS — BP 110/54 | HR 69 | Resp 20 | Ht 74.0 in | Wt 208.0 lb

## 2021-03-04 DIAGNOSIS — R0602 Shortness of breath: Secondary | ICD-10-CM | POA: Diagnosis not present

## 2021-03-04 DIAGNOSIS — Z951 Presence of aortocoronary bypass graft: Secondary | ICD-10-CM

## 2021-03-04 DIAGNOSIS — I25119 Atherosclerotic heart disease of native coronary artery with unspecified angina pectoris: Secondary | ICD-10-CM

## 2021-03-04 NOTE — Progress Notes (Addendum)
[address]       [place] [address]             [phone]      Khaleem Lee Kratochvil Lester Medical Record #[id number] Date of Birth: Apr 09, 1945  Referring: Martinique, Peter M, MD Primary Care: Caren Macadam, MD Primary Cardiologist: Pixie Casino, MD   Chief Complaint:   POST OP FOLLOW UP   01/20/2021 Patient:  Brynda Greathouse Achille Pre-Op Dx: Coronary artery disease                         Congestive heart failure                         Hypertension Post-op Dx: Same Procedure: CABG X 3.  LIMA to LAD.  Reverse saphenous vein graft to PDA, and obtuse marginal Endoscopic greater saphenous vein harvest on the right     Surgeon and Role:      * Lightfoot, Lucile Crater, MD - Primary    Evonnie Pat, PA-C- assisting   History of Present Illness:    Patient is a 76 year old male status post the above described procedure seen in the office in routine postsurgical follow-up.  Overall he reports that he is making pretty steady progress but does have some concerns regarding increased shakiness in his hands.  He has also had some lower back discomfort as well as some symptoms that he relates to his hiatal hernia.  He did take nitroglycerin on 1 occasion for chest pain.  He has had some constipation but it is not improved over time.  He is no longer taking any narcotics and only using occasional Tylenol for pain.  He is not having any significant shortness of breath or palpitations.  He is not having any lower extremity edema.  He has had no fevers, chills or other worrisome symptoms for infection.      Past Medical History:  Diagnosis Date   AAA (abdominal aortic aneurysm)    s/p EVAR 04/21/17   Allergy    Anxiety    Arthritis    Coronary artery disease    Depression    Dyspnea    with exertion   GERD (gastroesophageal reflux disease)    History of hiatal hernia    History of kidney stones    Hx of colonic polyps 03/31/2011   Hyperlipidemia     Hypothyroidism    Neuromuscular disorder (HCC)    hiatal hernia   Sleep apnea    cpap   Vertigo      Social History   Tobacco Use  Smoking Status Former   Packs/day: 1.50   Years: 39.00   Pack years: 58.50   Types: Cigarettes   Quit date: 02/23/2006   Years since quitting: 15.0  Smokeless Tobacco Former   Types: Chew    Social History   Substance and Sexual Activity  Alcohol Use Yes   Comment: occasional     Allergies  Allergen Reactions   Bee Venom Anaphylaxis and Swelling   Penicillins Anaphylaxis and Other (See Comments)    "Mushroom" popped up on hip Has patient had a PCN reaction causing immediate rash, facial/tongue/throat swelling, SOB or lightheadedness with hypotension: Yes Has patient had a PCN reaction causing severe rash involving mucus membranes or skin necrosis: Yes Has patient had a PCN reaction that required hospitalization: Yes - In hospital Has patient had  a PCN reaction occurring within the last 10 years: No If all of the above answers are "NO", then may proceed with Cephalosporin use.    Shellfish Allergy Hives and Swelling    CRAB MEAT > WHELPS   Latex     UNSPECIFIED REACTION     Current Outpatient Medications  Medication Sig Dispense Refill   amiodarone (PACERONE) 200 MG tablet Take 1 tablet (200 mg total) by mouth 2 (two) times daily. 60 tablet 1   aspirin EC 325 MG EC tablet Take 1 tablet (325 mg total) by mouth daily.     capsaicin (ZOSTRIX) 0.025 % cream Apply 1 application topically 2 (two) times daily as needed (for feet).     Cholecalciferol (VITAMIN D3) 125 MCG (5000 UT) CAPS Take 5,000 Units by mouth every morning.     Cyanocobalamin (VITAMIN B-12 PO) Take by mouth daily.     levothyroxine (SYNTHROID) 25 MCG tablet Take 1 tablet (25 mcg total) by mouth daily before breakfast. (Patient taking differently: Take 12.5 mcg by mouth daily before breakfast.) 90 tablet 1   metoprolol tartrate (LOPRESSOR) 25 MG tablet Take 1 tablet (25 mg  total) by mouth 2 (two) times daily. 60 tablet 1   omeprazole (PRILOSEC) 20 MG capsule Take 40 mg by mouth daily.     oxyCODONE (OXY IR/ROXICODONE) 5 MG immediate release tablet Take 1 tablet (5 mg total) by mouth every 6 (six) hours as needed for severe pain. 28 tablet 0   PARoxetine (PAXIL) 20 MG tablet Take 10 mg by mouth in the morning and at bedtime.     rosuvastatin (CRESTOR) 40 MG tablet Take 1 tablet (40 mg total) by mouth daily. 90 tablet 3   No current facility-administered medications for this visit.       Physical Exam: BP (!) 110/54 (BP Location: Left Arm, Patient Position: Sitting)    Pulse 69    Resp 20    Ht 6\' 2"  (1.88 m)    Wt 208 lb (94.3 kg)    SpO2 95% Comment: RA   BMI 26.71 kg/m   General appearance: alert, cooperative, and no distress Heart: regular rate and rhythm Lungs: clear to auscultation bilaterally Abdomen: Benign exam Extremities: No edema Wound: Incision is well-healed without evidence of infection.   Diagnostic Studies & Laboratory data:     Recent Radiology Findings:   DG Chest 2 View  Result Date: 03/04/2021 CLINICAL DATA:  Status post CABG.  Shortness of breath. EXAM: CHEST - 2 VIEW COMPARISON:  01/24/2021 FINDINGS: Old left rib fractures. Lungs are clear without pulmonary edema. Heart size is within normal limits with post CABG changes. No significant pleural effusions. Aortic stent is partially imaged. IMPRESSION: No active cardiopulmonary disease. Electronically Signed   By: Markus Daft M.D.   On: 03/04/2021 12:40      Recent Lab Findings: Lab Results  Component Value Date   WBC 13.2 (H) 01/24/2021   HGB 9.4 (L) 01/24/2021   HCT 28.2 (L) 01/24/2021   PLT 226 01/24/2021   GLUCOSE 99 01/25/2021   CHOL 146 05/03/2020   TRIG 279.0 (H) 05/03/2020   HDL 35.00 (L) 05/03/2020   LDLDIRECT 71.0 05/03/2020   LDLCALC 77 11/01/2019   ALT 18 01/15/2021   AST 22 01/15/2021   NA 133 (L) 01/25/2021   K 3.7 01/25/2021   CL 101 01/25/2021    CREATININE 1.47 (H) 01/25/2021   BUN 27 (H) 01/25/2021   CO2 25 01/25/2021   TSH 4.06 08/16/2020  INR 1.4 (H) 01/20/2021   HGBA1C 5.2 01/15/2021      Assessment / Plan:        Medication Changes: No orders of the defined types were placed in this encounter.   Patient is overall doing well in his postsurgical recovery.  We discussed activity progression.  Due to his fairly recent onset of hand tremors I felt he should probably not start driving quite yet.  I referred him to his primary care to possibly obtain lab studies on his thyroid.  He currently takes a low-dose of Synthroid.  Tremor could potentially be medication related.  Hopefully we can stop his amiodarone soon.  Will defer to cardiology.  He does have a history of degenerative disease but I do not know the specifics.  He may require further evaluation by orthopedics or neurosurgery should he become increasingly symptomatic.  I have encouraged him to increase ambulation as able.  We discussed lifting and activity progression in general.  He will continue ongoing cardiology follow-up per routine.  We will see the patient again on a as needed basis for any surgically related issues or at request.  He can begin cardiac rehab and requests to do it in [place].    John Giovanni, PA-C  03/04/2021 1:18 PM

## 2021-03-04 NOTE — Patient Instructions (Signed)
Discussed activity progression and lifting restriction

## 2021-04-01 DIAGNOSIS — Z6829 Body mass index (BMI) 29.0-29.9, adult: Secondary | ICD-10-CM | POA: Diagnosis not present

## 2021-04-01 DIAGNOSIS — E669 Obesity, unspecified: Secondary | ICD-10-CM | POA: Diagnosis not present

## 2021-04-01 DIAGNOSIS — I10 Essential (primary) hypertension: Secondary | ICD-10-CM | POA: Diagnosis not present

## 2021-04-01 DIAGNOSIS — Z951 Presence of aortocoronary bypass graft: Secondary | ICD-10-CM | POA: Diagnosis not present

## 2021-04-01 DIAGNOSIS — E785 Hyperlipidemia, unspecified: Secondary | ICD-10-CM | POA: Diagnosis not present

## 2021-04-01 DIAGNOSIS — Z87891 Personal history of nicotine dependence: Secondary | ICD-10-CM | POA: Diagnosis not present

## 2021-04-02 DIAGNOSIS — I10 Essential (primary) hypertension: Secondary | ICD-10-CM | POA: Diagnosis not present

## 2021-04-02 DIAGNOSIS — E669 Obesity, unspecified: Secondary | ICD-10-CM | POA: Diagnosis not present

## 2021-04-02 DIAGNOSIS — Z6829 Body mass index (BMI) 29.0-29.9, adult: Secondary | ICD-10-CM | POA: Diagnosis not present

## 2021-04-02 DIAGNOSIS — E785 Hyperlipidemia, unspecified: Secondary | ICD-10-CM | POA: Diagnosis not present

## 2021-04-02 DIAGNOSIS — Z951 Presence of aortocoronary bypass graft: Secondary | ICD-10-CM | POA: Diagnosis not present

## 2021-04-02 DIAGNOSIS — Z87891 Personal history of nicotine dependence: Secondary | ICD-10-CM | POA: Diagnosis not present

## 2021-04-08 DIAGNOSIS — E785 Hyperlipidemia, unspecified: Secondary | ICD-10-CM | POA: Diagnosis not present

## 2021-04-08 DIAGNOSIS — Z87891 Personal history of nicotine dependence: Secondary | ICD-10-CM | POA: Diagnosis not present

## 2021-04-08 DIAGNOSIS — Z951 Presence of aortocoronary bypass graft: Secondary | ICD-10-CM | POA: Diagnosis not present

## 2021-04-08 DIAGNOSIS — E669 Obesity, unspecified: Secondary | ICD-10-CM | POA: Diagnosis not present

## 2021-04-08 DIAGNOSIS — I10 Essential (primary) hypertension: Secondary | ICD-10-CM | POA: Diagnosis not present

## 2021-04-08 DIAGNOSIS — Z6829 Body mass index (BMI) 29.0-29.9, adult: Secondary | ICD-10-CM | POA: Diagnosis not present

## 2021-04-10 DIAGNOSIS — E669 Obesity, unspecified: Secondary | ICD-10-CM | POA: Diagnosis not present

## 2021-04-10 DIAGNOSIS — I10 Essential (primary) hypertension: Secondary | ICD-10-CM | POA: Diagnosis not present

## 2021-04-10 DIAGNOSIS — E785 Hyperlipidemia, unspecified: Secondary | ICD-10-CM | POA: Diagnosis not present

## 2021-04-10 DIAGNOSIS — Z87891 Personal history of nicotine dependence: Secondary | ICD-10-CM | POA: Diagnosis not present

## 2021-04-10 DIAGNOSIS — Z6829 Body mass index (BMI) 29.0-29.9, adult: Secondary | ICD-10-CM | POA: Diagnosis not present

## 2021-04-10 DIAGNOSIS — Z951 Presence of aortocoronary bypass graft: Secondary | ICD-10-CM | POA: Diagnosis not present

## 2021-04-15 DIAGNOSIS — Z951 Presence of aortocoronary bypass graft: Secondary | ICD-10-CM | POA: Diagnosis not present

## 2021-04-15 DIAGNOSIS — Z87891 Personal history of nicotine dependence: Secondary | ICD-10-CM | POA: Diagnosis not present

## 2021-04-15 DIAGNOSIS — E669 Obesity, unspecified: Secondary | ICD-10-CM | POA: Diagnosis not present

## 2021-04-15 DIAGNOSIS — Z6829 Body mass index (BMI) 29.0-29.9, adult: Secondary | ICD-10-CM | POA: Diagnosis not present

## 2021-04-15 DIAGNOSIS — I10 Essential (primary) hypertension: Secondary | ICD-10-CM | POA: Diagnosis not present

## 2021-04-15 DIAGNOSIS — E785 Hyperlipidemia, unspecified: Secondary | ICD-10-CM | POA: Diagnosis not present

## 2021-04-16 DIAGNOSIS — Z87891 Personal history of nicotine dependence: Secondary | ICD-10-CM | POA: Diagnosis not present

## 2021-04-16 DIAGNOSIS — E669 Obesity, unspecified: Secondary | ICD-10-CM | POA: Diagnosis not present

## 2021-04-16 DIAGNOSIS — Z951 Presence of aortocoronary bypass graft: Secondary | ICD-10-CM | POA: Diagnosis not present

## 2021-04-16 DIAGNOSIS — Z6829 Body mass index (BMI) 29.0-29.9, adult: Secondary | ICD-10-CM | POA: Diagnosis not present

## 2021-04-16 DIAGNOSIS — E785 Hyperlipidemia, unspecified: Secondary | ICD-10-CM | POA: Diagnosis not present

## 2021-04-16 DIAGNOSIS — I10 Essential (primary) hypertension: Secondary | ICD-10-CM | POA: Diagnosis not present

## 2021-04-18 DIAGNOSIS — G4733 Obstructive sleep apnea (adult) (pediatric): Secondary | ICD-10-CM | POA: Diagnosis not present

## 2021-04-21 ENCOUNTER — Encounter (HOSPITAL_COMMUNITY): Payer: Self-pay | Admitting: Emergency Medicine

## 2021-04-21 ENCOUNTER — Ambulatory Visit: Payer: Medicare Other | Admitting: Family Medicine

## 2021-04-21 ENCOUNTER — Emergency Department (HOSPITAL_COMMUNITY): Payer: Medicare Other

## 2021-04-21 ENCOUNTER — Other Ambulatory Visit: Payer: Self-pay

## 2021-04-21 ENCOUNTER — Inpatient Hospital Stay (HOSPITAL_COMMUNITY)
Admission: EM | Admit: 2021-04-21 | Discharge: 2021-04-23 | DRG: 376 | Disposition: A | Discharge status: Home / Self Care | Payer: Medicare Other | Attending: Family Medicine | Admitting: Family Medicine

## 2021-04-21 DIAGNOSIS — I25119 Atherosclerotic heart disease of native coronary artery with unspecified angina pectoris: Secondary | ICD-10-CM | POA: Diagnosis not present

## 2021-04-21 DIAGNOSIS — R9431 Abnormal electrocardiogram [ECG] [EKG]: Secondary | ICD-10-CM | POA: Diagnosis present

## 2021-04-21 DIAGNOSIS — M1990 Unspecified osteoarthritis, unspecified site: Secondary | ICD-10-CM | POA: Diagnosis present

## 2021-04-21 DIAGNOSIS — K449 Diaphragmatic hernia without obstruction or gangrene: Secondary | ICD-10-CM | POA: Diagnosis present

## 2021-04-21 DIAGNOSIS — Z9049 Acquired absence of other specified parts of digestive tract: Secondary | ICD-10-CM | POA: Diagnosis not present

## 2021-04-21 DIAGNOSIS — Z951 Presence of aortocoronary bypass graft: Secondary | ICD-10-CM | POA: Diagnosis not present

## 2021-04-21 DIAGNOSIS — R1310 Dysphagia, unspecified: Secondary | ICD-10-CM | POA: Diagnosis not present

## 2021-04-21 DIAGNOSIS — K2289 Other specified disease of esophagus: Secondary | ICD-10-CM

## 2021-04-21 DIAGNOSIS — Z20822 Contact with and (suspected) exposure to covid-19: Secondary | ICD-10-CM | POA: Diagnosis present

## 2021-04-21 DIAGNOSIS — C154 Malignant neoplasm of middle third of esophagus: Secondary | ICD-10-CM | POA: Diagnosis not present

## 2021-04-21 DIAGNOSIS — K219 Gastro-esophageal reflux disease without esophagitis: Secondary | ICD-10-CM | POA: Diagnosis present

## 2021-04-21 DIAGNOSIS — Z91030 Bee allergy status: Secondary | ICD-10-CM

## 2021-04-21 DIAGNOSIS — E876 Hypokalemia: Secondary | ICD-10-CM | POA: Diagnosis not present

## 2021-04-21 DIAGNOSIS — Z86010 Personal history of colonic polyps: Secondary | ICD-10-CM

## 2021-04-21 DIAGNOSIS — N182 Chronic kidney disease, stage 2 (mild): Secondary | ICD-10-CM | POA: Diagnosis present

## 2021-04-21 DIAGNOSIS — Z95828 Presence of other vascular implants and grafts: Secondary | ICD-10-CM | POA: Diagnosis not present

## 2021-04-21 DIAGNOSIS — Z79890 Hormone replacement therapy (postmenopausal): Secondary | ICD-10-CM

## 2021-04-21 DIAGNOSIS — Z800 Family history of malignant neoplasm of digestive organs: Secondary | ICD-10-CM | POA: Diagnosis not present

## 2021-04-21 DIAGNOSIS — I10 Essential (primary) hypertension: Secondary | ICD-10-CM | POA: Diagnosis not present

## 2021-04-21 DIAGNOSIS — E039 Hypothyroidism, unspecified: Secondary | ICD-10-CM | POA: Diagnosis present

## 2021-04-21 DIAGNOSIS — Z8679 Personal history of other diseases of the circulatory system: Secondary | ICD-10-CM

## 2021-04-21 DIAGNOSIS — D3100 Benign neoplasm of unspecified conjunctiva: Secondary | ICD-10-CM | POA: Diagnosis not present

## 2021-04-21 DIAGNOSIS — Z880 Allergy status to penicillin: Secondary | ICD-10-CM

## 2021-04-21 DIAGNOSIS — Z8249 Family history of ischemic heart disease and other diseases of the circulatory system: Secondary | ICD-10-CM

## 2021-04-21 DIAGNOSIS — I77810 Thoracic aortic ectasia: Secondary | ICD-10-CM | POA: Diagnosis not present

## 2021-04-21 DIAGNOSIS — Z87891 Personal history of nicotine dependence: Secondary | ICD-10-CM | POA: Diagnosis not present

## 2021-04-21 DIAGNOSIS — G4730 Sleep apnea, unspecified: Secondary | ICD-10-CM | POA: Diagnosis present

## 2021-04-21 DIAGNOSIS — E785 Hyperlipidemia, unspecified: Secondary | ICD-10-CM | POA: Diagnosis present

## 2021-04-21 DIAGNOSIS — G4733 Obstructive sleep apnea (adult) (pediatric): Secondary | ICD-10-CM | POA: Diagnosis not present

## 2021-04-21 DIAGNOSIS — Z87442 Personal history of urinary calculi: Secondary | ICD-10-CM

## 2021-04-21 DIAGNOSIS — S2242XA Multiple fractures of ribs, left side, initial encounter for closed fracture: Secondary | ICD-10-CM | POA: Diagnosis not present

## 2021-04-21 DIAGNOSIS — I480 Paroxysmal atrial fibrillation: Secondary | ICD-10-CM

## 2021-04-21 DIAGNOSIS — Z91040 Latex allergy status: Secondary | ICD-10-CM

## 2021-04-21 DIAGNOSIS — I2510 Atherosclerotic heart disease of native coronary artery without angina pectoris: Secondary | ICD-10-CM | POA: Diagnosis not present

## 2021-04-21 DIAGNOSIS — Z7982 Long term (current) use of aspirin: Secondary | ICD-10-CM

## 2021-04-21 DIAGNOSIS — R1319 Other dysphagia: Secondary | ICD-10-CM | POA: Diagnosis not present

## 2021-04-21 DIAGNOSIS — Z79899 Other long term (current) drug therapy: Secondary | ICD-10-CM

## 2021-04-21 DIAGNOSIS — K5730 Diverticulosis of large intestine without perforation or abscess without bleeding: Secondary | ICD-10-CM | POA: Diagnosis not present

## 2021-04-21 DIAGNOSIS — Z823 Family history of stroke: Secondary | ICD-10-CM | POA: Diagnosis not present

## 2021-04-21 DIAGNOSIS — K4090 Unilateral inguinal hernia, without obstruction or gangrene, not specified as recurrent: Secondary | ICD-10-CM | POA: Diagnosis not present

## 2021-04-21 DIAGNOSIS — Z91013 Allergy to seafood: Secondary | ICD-10-CM | POA: Diagnosis not present

## 2021-04-21 DIAGNOSIS — E860 Dehydration: Secondary | ICD-10-CM

## 2021-04-21 DIAGNOSIS — R079 Chest pain, unspecified: Secondary | ICD-10-CM | POA: Diagnosis not present

## 2021-04-21 DIAGNOSIS — R0789 Other chest pain: Secondary | ICD-10-CM | POA: Diagnosis not present

## 2021-04-21 DIAGNOSIS — R634 Abnormal weight loss: Secondary | ICD-10-CM | POA: Diagnosis not present

## 2021-04-21 LAB — CBC
HCT: 39.8 % (ref 39.0–52.0)
Hemoglobin: 12.9 g/dL — ABNORMAL LOW (ref 13.0–17.0)
MCH: 29.7 pg (ref 26.0–34.0)
MCHC: 32.4 g/dL (ref 30.0–36.0)
MCV: 91.5 fL (ref 80.0–100.0)
Platelets: 284 10*3/uL (ref 150–400)
RBC: 4.35 MIL/uL (ref 4.22–5.81)
RDW: 14.1 % (ref 11.5–15.5)
WBC: 9.4 10*3/uL (ref 4.0–10.5)
nRBC: 0 % (ref 0.0–0.2)

## 2021-04-21 LAB — HEPATIC FUNCTION PANEL
ALT: 20 U/L (ref 0–44)
AST: 23 U/L (ref 15–41)
Albumin: 3.6 g/dL (ref 3.5–5.0)
Alkaline Phosphatase: 44 U/L (ref 38–126)
Bilirubin, Direct: 0.2 mg/dL (ref 0.0–0.2)
Indirect Bilirubin: 0.4 mg/dL (ref 0.3–0.9)
Total Bilirubin: 0.6 mg/dL (ref 0.3–1.2)
Total Protein: 6.2 g/dL — ABNORMAL LOW (ref 6.5–8.1)

## 2021-04-21 LAB — RESP PANEL BY RT-PCR (FLU A&B, COVID) ARPGX2
Influenza A by PCR: NEGATIVE
Influenza B by PCR: NEGATIVE
SARS Coronavirus 2 by RT PCR: NEGATIVE

## 2021-04-21 LAB — BASIC METABOLIC PANEL
Anion gap: 10 (ref 5–15)
BUN: 11 mg/dL (ref 8–23)
CO2: 27 mmol/L (ref 22–32)
Calcium: 9.1 mg/dL (ref 8.9–10.3)
Chloride: 106 mmol/L (ref 98–111)
Creatinine, Ser: 1.36 mg/dL — ABNORMAL HIGH (ref 0.61–1.24)
GFR, Estimated: 54 mL/min — ABNORMAL LOW (ref >60)
Glucose, Bld: 95 mg/dL (ref 70–99)
Potassium: 3.3 mmol/L — ABNORMAL LOW (ref 3.5–5.1)
Sodium: 143 mmol/L (ref 135–145)

## 2021-04-21 LAB — TROPONIN I (HIGH SENSITIVITY)
Troponin I (High Sensitivity): 14 ng/L (ref <18)
Troponin I (High Sensitivity): 14 ng/L (ref <18)

## 2021-04-21 LAB — TSH: TSH: 7.007 u[IU]/mL — ABNORMAL HIGH (ref 0.350–4.500)

## 2021-04-21 MED ORDER — PANTOPRAZOLE 2 MG/ML SUSPENSION
40.0000 mg | Freq: Every day | ORAL | Status: DC
Start: 2021-04-21 — End: 2021-04-23
  Administered 2021-04-21 – 2021-04-23 (×2): 40 mg via ORAL
  Filled 2021-04-21 (×4): qty 20

## 2021-04-21 MED ORDER — SODIUM CHLORIDE 0.9 % IV SOLN: Freq: Once | INTRAVENOUS | Status: AC

## 2021-04-21 MED ORDER — ACETAMINOPHEN 650 MG RE SUPP: 650.0000 mg | Freq: Four times a day (QID) | RECTAL | Status: DC | PRN

## 2021-04-21 MED ORDER — ACETAMINOPHEN 325 MG PO TABS: 650.0000 mg | ORAL_TABLET | Freq: Four times a day (QID) | ORAL | Status: DC | PRN

## 2021-04-21 MED ORDER — ONDANSETRON HCL 4 MG/2ML IJ SOLN
4.0000 mg | Freq: Four times a day (QID) | INTRAMUSCULAR | Status: DC | PRN
  Administered 2021-04-22: 4 mg via INTRAVENOUS
  Filled 2021-04-21: qty 2

## 2021-04-21 MED ORDER — METOPROLOL TARTRATE 25 MG PO TABS
25.0000 mg | ORAL_TABLET | Freq: Two times a day (BID) | ORAL | Status: DC
Start: 2021-04-21 — End: 2021-04-23
  Administered 2021-04-22 – 2021-04-23 (×2): 25 mg via ORAL
  Filled 2021-04-21 (×3): qty 1

## 2021-04-21 MED ORDER — ALUM & MAG HYDROXIDE-SIMETH 200-200-20 MG/5ML PO SUSP
30.0000 mL | Freq: Once | ORAL | Status: AC
  Administered 2021-04-21: 30 mL via ORAL
  Filled 2021-04-21: qty 30

## 2021-04-21 MED ORDER — IOHEXOL 350 MG/ML SOLN
80.0000 mL | Freq: Once | INTRAVENOUS | Status: AC | PRN
Start: 2021-04-21 — End: 2021-04-21
  Administered 2021-04-21: 80 mL via INTRAVENOUS

## 2021-04-21 MED ORDER — ENOXAPARIN SODIUM 40 MG/0.4ML IJ SOSY
40.0000 mg | PREFILLED_SYRINGE | INTRAMUSCULAR | Status: DC
  Administered 2021-04-21 – 2021-04-22 (×2): 40 mg via SUBCUTANEOUS
  Filled 2021-04-21 (×2): qty 0.4

## 2021-04-21 MED ORDER — LEVOTHYROXINE SODIUM 25 MCG PO TABS
12.5000 ug | ORAL_TABLET | Freq: Every day | ORAL | Status: DC
  Administered 2021-04-22 – 2021-04-23 (×2): 12.5 ug via ORAL
  Filled 2021-04-21 (×2): qty 1

## 2021-04-21 MED ORDER — DEXLANSOPRAZOLE 30 MG PO CPDR: 30.0000 mg | DELAYED_RELEASE_CAPSULE | Freq: Every day | ORAL | 0 refills | Status: DC

## 2021-04-21 MED ORDER — POTASSIUM CHLORIDE 10 MEQ/100ML IV SOLN
10.0000 meq | INTRAVENOUS | Status: AC
  Administered 2021-04-21 (×2): 10 meq via INTRAVENOUS
  Filled 2021-04-21 (×2): qty 100

## 2021-04-21 NOTE — H&P (View-Only) (Signed)
Consultation  Referring Provider:     [facility] Dr. Karleen Hampshire Primary Care Physician:  Caren Macadam, MD Primary Gastroenterologist:        Carlean Purl Reason for Consultation:     Dysphagia, esophageal mass     Impression / Plan:   Clinical scenario compatible with esophageal cancer.  He needs an EGD for diagnostic purposes.  I will try to do that tomorrow though I am not confident that we have availability to make that happen.  I will have him keep n.p.o. after midnight just in case and we will sort through it if not able to do it tomorrow we will do it the following day, hopefully.  Other significant medical problems include coronary artery disease status post CABG, history of abdominal aortic aneurysm status post stenting, paroxysmal atrial fibrillation not on anticoagulation therapy.  He is on Lovenox for DVT prophylaxis and this does not need to be held for his EGD.   The risks and benefits as well as alternatives of endoscopic procedure(s) have been discussed and reviewed. All questions answered. The patient agrees to proceed.    Gatha Mayer, MD, [facility]             HPI:   Hector Morris is a 76 y.o. male being admitted with a history of progressive dysphagia and weight loss and a CT scan demonstrating an esophageal mass suspicious for cancer.  He has been having problems for several months, he had a CABG in November.  Progressive dysphagia now tolerating only things like ice cream with regularity.  No prior EGD.  I see him for a history of colon polyps and a performed colonoscopy, last of which was in 2018 and he had 2 adenomas removed maximum 7 mm, history of 2 adenomas in 2013 as well.  He has been having some intermittent chest pain issues with eating as well.  Weight is down at least 20 pounds over several months.  He says he was 217 pounds right after his surgery.  Wt Readings from Last 3 Encounters:  04/21/21 88.5 kg  03/04/21 94.3 kg  02/05/21 93.7 kg      Past Medical History:  Diagnosis Date   AAA (abdominal aortic aneurysm)    s/p EVAR 04/21/17   Allergy    Anxiety    Arthritis    Coronary artery disease    Depression    Dyspnea    with exertion   GERD (gastroesophageal reflux disease)    History of hiatal hernia    History of kidney stones    Hx of colonic polyps 03/31/2011   Hyperlipidemia    Hypothyroidism    Neuromuscular disorder ([facility])    hiatal hernia   Sleep apnea    cpap   Vertigo     Past Surgical History:  Procedure Laterality Date   ABDOMINAL AORTIC ENDOVASCULAR STENT GRAFT N/A 04/21/2017   Procedure: ABDOMINAL AORTIC ENDOVASCULAR STENT GRAFT;  Surgeon: Serafina Mitchell, MD;  Location: [facility];  Service: Vascular;  Laterality: N/A;   APPENDECTOMY     CHOLECYSTECTOMY     COLONOSCOPY     last 05/2016   CORONARY ARTERY BYPASS GRAFT N/A 01/20/2021   Procedure: CORONARY ARTERY BYPASS GRAFTING (CABG) TIMES THREE USING LEFT INTERNAL MAMMARY ARTERY AND RIGHT GREATER SAPHENOUS VEIN HARVESTED ENDOSCOPICALLY.;  Surgeon: Lajuana Matte, MD;  Location: [facility];  Service: Open Heart Surgery;  Laterality: N/A;   ELBOW SURGERY Left    as teenager  ENDOVEIN HARVEST OF GREATER SAPHENOUS VEIN Right 01/20/2021   Procedure: ENDOVEIN HARVEST OF GREATER SAPHENOUS VEIN;  Surgeon: Lajuana Matte, MD;  Location: [facility];  Service: Open Heart Surgery;  Laterality: Right;   EYE SURGERY Bilateral 05/21/2020   Cataract Surgery   HERNIA REPAIR Right    inguinal   INTRAVASCULAR PRESSURE WIRE/FFR STUDY N/A 01/02/2021   Procedure: INTRAVASCULAR PRESSURE WIRE/FFR STUDY;  Surgeon: Martinique, Peter M, MD;  Location: [facility];  Service: Cardiovascular;  Laterality: N/A;   KNEE SURGERY Right    scope - cartilage removed   LEFT HEART CATH AND CORONARY ANGIOGRAPHY N/A 01/02/2021   Procedure: LEFT HEART CATH AND CORONARY ANGIOGRAPHY;  Surgeon: Martinique, Peter M, MD;  Location: [facility];  Service: Cardiovascular;   Laterality: N/A;   POLYPECTOMY  2018   TEE WITHOUT CARDIOVERSION N/A 01/20/2021   Procedure: TRANSESOPHAGEAL ECHOCARDIOGRAM (TEE);  Surgeon: Lajuana Matte, MD;  Location: [facility];  Service: Open Heart Surgery;  Laterality: N/A;   TONSILLECTOMY      Family History  Problem Relation Age of Onset   Heart murmur Mother    Heart attack Father    Stroke Sister    Obesity Brother    Colon cancer Neg Hx    Colon polyps Neg Hx     Social History   Tobacco Use   Smoking status: Former    Packs/day: 1.50    Years: 39.00    Pack years: 58.50    Types: Cigarettes    Quit date: 02/23/2006    Years since quitting: 15.1   Smokeless tobacco: Former    Types: Nurse, children's Use: Never used  Substance Use Topics   Alcohol use: Yes    Comment: occasional   Drug use: No    Prior to Admission medications   Medication Sig Start Date End Date Taking? Authorizing Provider  amiodarone (PACERONE) 200 MG tablet Take 1 tablet (200 mg total) by mouth 2 (two) times daily. 01/25/21   John Giovanni, PA-C  aspirin EC 325 MG EC tablet Take 1 tablet (325 mg total) by mouth daily. 01/25/21   Gold, Patrick Jupiter E, PA-C  capsaicin (ZOSTRIX) 0.025 % cream Apply 1 application topically 2 (two) times daily as needed (for feet).    [provider]  Cholecalciferol (VITAMIN D3) 125 MCG (5000 UT) CAPS Take 5,000 Units by mouth every morning.    [provider]  Cyanocobalamin (VITAMIN B-12 PO) Take by mouth daily.    [provider]  levothyroxine (SYNTHROID) 25 MCG tablet Take 1 tablet (25 mcg total) by mouth daily before breakfast. Patient taking differently: Take 12.5 mcg by mouth daily before breakfast. 11/08/19   [name], [name], MD  metoprolol tartrate (LOPRESSOR) 25 MG tablet Take 1 tablet (25 mg total) by mouth 2 (two) times daily. 01/25/21   Gold, Patrick Jupiter E, PA-C  oxyCODONE (OXY IR/ROXICODONE) 5 MG immediate release tablet Take 1 tablet (5 mg total) by mouth every 6 (six)  hours as needed for severe pain. 01/25/21   Gold, Wilder Glade, PA-C  PARoxetine (PAXIL) 20 MG tablet Take 10 mg by mouth in the morning and at bedtime.    [provider]  rosuvastatin (CRESTOR) 40 MG tablet Take 1 tablet (40 mg total) by mouth daily. 03/23/17   Hilty, Nadean Corwin, MD    Current Facility-Administered Medications  Medication Dose Route Frequency Provider Last Rate Last Admin   acetaminophen (TYLENOL) tablet 650  mg  650 mg Oral Q6H PRN Hosie Poisson, MD       Or   acetaminophen (TYLENOL) suppository 650 mg  650 mg Rectal Q6H PRN Hosie Poisson, MD       enoxaparin (LOVENOX) injection 40 mg  40 mg Subcutaneous Q24H Hosie Poisson, MD   40 mg at 04/21/21 1757   [START ON 04/22/2021] levothyroxine (SYNTHROID) tablet 12.5 mcg  12.5 mcg Oral Q0600 Hosie Poisson, MD       metoprolol tartrate (LOPRESSOR) tablet 25 mg  25 mg Oral BID Hosie Poisson, MD       ondansetron (ZOFRAN) injection 4 mg  4 mg Intravenous Q6H PRN Hosie Poisson, MD       pantoprazole sodium (PROTONIX) 40 mg/20 mL oral suspension 40 mg  40 mg Oral Daily Hosie Poisson, MD       potassium chloride 10 mEq in 100 mL IVPB  10 mEq Intravenous Q1 Hr x 2 Hosie Poisson, MD 100 mL/hr at 04/21/21 1756 10 mEq at 04/21/21 1756   Current Outpatient Medications  Medication Sig Dispense Refill   amiodarone (PACERONE) 200 MG tablet Take 1 tablet (200 mg total) by mouth 2 (two) times daily. 60 tablet 1   aspirin EC 325 MG EC tablet Take 1 tablet (325 mg total) by mouth daily.     capsaicin (ZOSTRIX) 0.025 % cream Apply 1 application topically 2 (two) times daily as needed (for feet).     Cholecalciferol (VITAMIN D3) 125 MCG (5000 UT) CAPS Take 5,000 Units by mouth every morning.     Cyanocobalamin (VITAMIN B-12 PO) Take by mouth daily.     levothyroxine (SYNTHROID) 25 MCG tablet Take 1 tablet (25 mcg total) by mouth daily before breakfast. (Patient taking differently: Take 12.5 mcg by mouth daily before breakfast.) 90 tablet 1    metoprolol tartrate (LOPRESSOR) 25 MG tablet Take 1 tablet (25 mg total) by mouth 2 (two) times daily. 60 tablet 1   oxyCODONE (OXY IR/ROXICODONE) 5 MG immediate release tablet Take 1 tablet (5 mg total) by mouth every 6 (six) hours as needed for severe pain. 28 tablet 0   PARoxetine (PAXIL) 20 MG tablet Take 10 mg by mouth in the morning and at bedtime.     rosuvastatin (CRESTOR) 40 MG tablet Take 1 tablet (40 mg total) by mouth daily. 90 tablet 3    Allergies as of 04/21/2021 - Review Complete 04/21/2021  Allergen Reaction Noted   Bee venom Anaphylaxis and Swelling 05/19/2016   Penicillins Anaphylaxis and Other (See Comments) 05/19/2016   Shellfish allergy Hives and Swelling 04/20/2017   Latex  05/19/2016     Review of Systems:    This is positive for those things mentioned in the HPI All other review of systems are negative.       Physical Exam:  Vital signs in last 24 hours: Temp:  [97.5 F (36.4 C)] 97.5 F (36.4 C) (02/27 1049) Pulse Rate:  [60-140] 67 (02/27 1615) Resp:  [11-26] 18 (02/27 1615) BP: (119-147)/(63-81) 130/78 (02/27 1615) SpO2:  [94 %-99 %] 95 % (02/27 1615) Weight:  [88.5 kg] 88.5 kg (02/27 1321)    General:  Well-developed, well-nourished and in no acute distress Eyes:  anicteric. ENT:   Mouth and posterior pharynx free of lesions.  Neck:   supple w/o thyromegaly or mass.  Lungs: Clear to auscultation bilaterally. Heart:   S1S2, no rubs, murmurs, gallops. Abdomen:  soft, non-tender, no hepatosplenomegaly, hernia, or mass and BS+.  Lymph:  no cervical or supraclavicular adenopathy. Extremities:   no edema Skin   no rash. Neuro:  A&O x 3.  Psych:  appropriate mood and  Affect.   Data Reviewed:   LAB RESULTS: Recent Labs    04/21/21 1055  WBC 9.4  HGB 12.9*  HCT 39.8  PLT 284   BMET Recent Labs    04/21/21 1055  NA 143  K 3.3*  CL 106  CO2 27  GLUCOSE 95  BUN 11  CREATININE 1.36*  CALCIUM 9.1   LFT Recent Labs     04/21/21 1321  PROT 6.2*  ALBUMIN 3.6  AST 23  ALT 20  ALKPHOS 44  BILITOT 0.6  BILIDIR 0.2  IBILI 0.4    STUDIES: DG Chest 2 View  Result Date: 04/21/2021 CLINICAL DATA:  Right-sided chest pain EXAM: CHEST - 2 VIEW COMPARISON:  Chest radiograph 03/04/2021 FINDINGS: Postsurgical change reflecting prior CABG are again seen. The cardiomediastinal silhouette is stable. There is no focal consolidation or pulmonary edema. There is no pleural effusion or pneumothorax. There is no acute osseous abnormality. There is multilevel degenerative change of the thoracic spine. An abdominal aortic endograft is noted. IMPRESSION: No radiographic evidence of acute cardiopulmonary process. Electronically Signed   By: Valetta Mole M.D.   On: 04/21/2021 11:22   CT CHEST ABDOMEN PELVIS W CONTRAST  Result Date: 04/21/2021 CLINICAL DATA:  Weight loss EXAM: CT CHEST, ABDOMEN, AND PELVIS WITH CONTRAST TECHNIQUE: Multidetector CT imaging of the chest, abdomen and pelvis was performed following the standard protocol during bolus administration of intravenous contrast. RADIATION DOSE REDUCTION: This exam was performed according to the departmental dose-optimization program which includes automated exposure control, adjustment of the mA and/or kV according to patient size and/or use of iterative reconstruction technique. CONTRAST:  67mL OMNIPAQUE IOHEXOL 350 MG/ML SOLN COMPARISON:  CT chest, abdomen and pelvis dated Jul 07, 2019 FINDINGS: CT CHEST FINDINGS Cardiovascular: Normal heart size. No pericardial effusion. Left main and three-vessel coronary artery calcifications status post CABG. Atherosclerotic disease of the thoracic aorta. Dilated ascending thoracic aorta measuring up to 4.1 cm, unchanged when compared with prior exams. Mediastinum/Nodes: Focal masslike thickening of the mid/distal esophagus measuring up to 4.3 x 3.1 cm. Mildly enlarged paraesophageal lymph nodes. Reference paraesophageal lymph node measuring 1.1  cm in short axis on series 3, image 42. Esophagus is unremarkable. Lungs/Pleura: Central airways are patent. Paraseptal and centrilobular emphysema. No consolidation, pleural effusion or pneumothorax. Right lower lobe reticular opacities, unchanged compared to prior exams and likely due to scarring. Musculoskeletal: No chest wall mass or suspicious bone lesions identified. Prior median sternotomy with intact sternal wires. Old left-sided rib fractures. CT ABDOMEN PELVIS FINDINGS Hepatobiliary: No focal liver abnormality is seen. Status post cholecystectomy. No biliary dilatation. Pancreas: Unremarkable. No pancreatic ductal dilatation or surrounding inflammatory changes. Spleen: Normal in size without focal abnormality. Adrenals/Urinary Tract: Adrenal glands are unremarkable. Kidneys are normal, without renal calculi, focal lesion, or hydronephrosis. Bladder is unremarkable. Stomach/Bowel: Stomach is within normal limits. Diverticulosis primarily of the sigmoid colon. No evidence of bowel wall thickening, distention, or inflammatory changes. Vascular/Lymphatic: Prior aorta bi-iliac endovascular repair of infrarenal abdominal aortic aneurysm, unchanged in appearance when compared with prior exam. No pathologically enlarged lymph nodes seen in the abdomen or pelvis. Reproductive: Mild prostatomegaly. Other: Small fat containing left inguinal hernia. No abdominopelvic ascites. Musculoskeletal: No acute or significant osseous findings. IMPRESSION: 1. Focal masslike thickening of the mid/distal esophagus which associated mildly enlarged paraesophageal lymph nodes, findings are concerning  for primary esophageal malignancy. 2. No evidence of metastatic disease in the abdomen or pelvis. 3. Prior aorta bi-iliac endovascular repair of infrarenal abdominal aortic aneurysm, unchanged in appearance when compared with prior exam. 4. Sigmoid diverticulosis with no diverticulitis. 5. Aortic Atherosclerosis (ICD10-I70.0) and  Emphysema (ICD10-J43.9). * onc * Electronically Signed   By: Yetta Glassman M.D.   On: 04/21/2021 16:20       Thanks   LOS: 0 days   @Karel Turpen  Simonne Maffucci, MD, [facility] @  04/21/2021, 6:54 PM

## 2021-04-21 NOTE — ED Notes (Signed)
Patient transported to CT 

## 2021-04-21 NOTE — H&P (Signed)
History and Physical    Hector Morris YFV:[id number] DOB: 11/27/1945 DOA: 04/21/2021  PCP: Caren Macadam, MD Patient coming from: Home.   I have personally briefly reviewed patient's old medical records in [place]  Chief Complaint: right sided chest pain.   HPI: Hector Morris is a 76 y.o. male with medical history significant of AAA s/p repair in 2019, CAD, CABG in November 2022., hyperlipidemia, hypothyroidism, presents to ED for progressive difficulty swallowing water.  Of several months, along with right-sided chest pain nonradiating, associated with nausea and vomiting progressively gets worse after eating. Patient reports he is only able to take liquid diet for now. Patient denies any fevers or chills, denies abdominal pain shortness of breath or cough. Patient denies any rectal bleeding, or hemoptysis.  ED Course: on arrival to ED he was afebrile, , tachycardic in 140/min, , tachypnea of 26/min and BP of 147/63 mm HG.  LABS significant for hemoglobin of 12.9, negative troponins, potassium of 3.3 and creatinine of 1.36.  CT of the chest abdomen pelvis shows Focal masslike thickening of the mid/distal esophagus which associated mildly enlarged paraesophageal lymph nodes, findings are concerning for primary esophageal malignancy. No evidence of metastatic disease in the abdomen or pelvis.   Review of Systems: As per HPI otherwise All others reviewed and are negative,  Past Medical History:  Diagnosis Date   AAA (abdominal aortic aneurysm)    s/p EVAR 04/21/17   Allergy    Anxiety    Arthritis    Coronary artery disease    Depression    Dyspnea    with exertion   GERD (gastroesophageal reflux disease)    History of hiatal hernia    History of kidney stones    Hx of colonic polyps 03/31/2011   Hyperlipidemia    Hypothyroidism    Neuromuscular disorder ([place])    hiatal hernia   Sleep apnea    cpap   Vertigo     Past Surgical History:  Procedure  Laterality Date   ABDOMINAL AORTIC ENDOVASCULAR STENT GRAFT N/A 04/21/2017   Procedure: ABDOMINAL AORTIC ENDOVASCULAR STENT GRAFT;  Surgeon: Serafina Mitchell, MD;  Location: [facility];  Service: Vascular;  Laterality: N/A;   APPENDECTOMY     CHOLECYSTECTOMY     COLONOSCOPY     last 05/2016   CORONARY ARTERY BYPASS GRAFT N/A 01/20/2021   Procedure: CORONARY ARTERY BYPASS GRAFTING (CABG) TIMES THREE USING LEFT INTERNAL MAMMARY ARTERY AND RIGHT GREATER SAPHENOUS VEIN HARVESTED ENDOSCOPICALLY.;  Surgeon: Lajuana Matte, MD;  Location: [facility];  Service: Open Heart Surgery;  Laterality: N/A;   ELBOW SURGERY Left    as teenager   ENDOVEIN HARVEST OF GREATER SAPHENOUS VEIN Right 01/20/2021   Procedure: ENDOVEIN HARVEST OF GREATER SAPHENOUS VEIN;  Surgeon: Lajuana Matte, MD;  Location: [facility];  Service: Open Heart Surgery;  Laterality: Right;   EYE SURGERY Bilateral 05/21/2020   Cataract Surgery   HERNIA REPAIR Right    inguinal   INTRAVASCULAR PRESSURE WIRE/FFR STUDY N/A 01/02/2021   Procedure: INTRAVASCULAR PRESSURE WIRE/FFR STUDY;  Surgeon: Martinique, Peter M, MD;  Location: [facility];  Service: Cardiovascular;  Laterality: N/A;   KNEE SURGERY Right    scope - cartilage removed   LEFT HEART CATH AND CORONARY ANGIOGRAPHY N/A 01/02/2021   Procedure: LEFT HEART CATH AND CORONARY ANGIOGRAPHY;  Surgeon: Martinique, Peter M, MD;  Location: [facility];  Service: Cardiovascular;  Laterality: N/A;   POLYPECTOMY  2018  TEE WITHOUT CARDIOVERSION N/A 01/20/2021   Procedure: TRANSESOPHAGEAL ECHOCARDIOGRAM (TEE);  Surgeon: Lajuana Matte, MD;  Location: [facility];  Service: Open Heart Surgery;  Laterality: N/A;   TONSILLECTOMY      Social History  reports that he quit smoking about 15 years ago. His smoking use included cigarettes. He has a 58.50 pack-year smoking history. He has quit using smokeless tobacco.  His smokeless tobacco use included chew. He reports current alcohol use. He  reports that he does not use drugs.  Allergies  Allergen Reactions   Bee Venom Anaphylaxis and Swelling   Penicillins Anaphylaxis and Other (See Comments)    "Mushroom" popped up on hip Has patient had a PCN reaction causing immediate rash, facial/tongue/throat swelling, SOB or lightheadedness with hypotension: Yes Has patient had a PCN reaction causing severe rash involving mucus membranes or skin necrosis: Yes Has patient had a PCN reaction that required hospitalization: Yes - In hospital Has patient had a PCN reaction occurring within the last 10 years: No If all of the above answers are "NO", then may proceed with Cephalosporin use.    Shellfish Allergy Hives and Swelling    CRAB MEAT > WHELPS   Latex     UNSPECIFIED REACTION     Family History  Problem Relation Age of Onset   Heart murmur Mother    Heart attack Father    Stroke Sister    Obesity Brother    Colon cancer Neg Hx    Colon polyps Neg Hx    Family history of liver carcinoma present in his brother.   Prior to Admission medications   Medication Sig Start Date End Date Taking? Authorizing Provider  amiodarone (PACERONE) 200 MG tablet Take 1 tablet (200 mg total) by mouth 2 (two) times daily. 01/25/21   John Giovanni, PA-C  aspirin EC 325 MG EC tablet Take 1 tablet (325 mg total) by mouth daily. 01/25/21   Gold, Patrick Jupiter E, PA-C  capsaicin (ZOSTRIX) 0.025 % cream Apply 1 application topically 2 (two) times daily as needed (for feet).    [provider]  Cholecalciferol (VITAMIN D3) 125 MCG (5000 UT) CAPS Take 5,000 Units by mouth every morning.    [provider]  Cyanocobalamin (VITAMIN B-12 PO) Take by mouth daily.    [provider]  levothyroxine (SYNTHROID) 25 MCG tablet Take 1 tablet (25 mcg total) by mouth daily before breakfast. Patient taking differently: Take 12.5 mcg by mouth daily before breakfast. 11/08/19   [name], [name], MD  metoprolol tartrate (LOPRESSOR) 25 MG tablet Take  1 tablet (25 mg total) by mouth 2 (two) times daily. 01/25/21   Gold, Patrick Jupiter E, PA-C  oxyCODONE (OXY IR/ROXICODONE) 5 MG immediate release tablet Take 1 tablet (5 mg total) by mouth every 6 (six) hours as needed for severe pain. 01/25/21   Gold, Wilder Glade, PA-C  PARoxetine (PAXIL) 20 MG tablet Take 10 mg by mouth in the morning and at bedtime.    [provider]  rosuvastatin (CRESTOR) 40 MG tablet Take 1 tablet (40 mg total) by mouth daily. 03/23/17   Pixie Casino, MD    Physical Exam: Vitals:   04/21/21 1445 04/21/21 1500 04/21/21 1530 04/21/21 1615  BP: 132/72 128/74 (!) 147/63 130/78  Pulse: 65 60 64 67  Resp: 11 20 17 18   Temp:      TempSrc:      SpO2: 96% 95% 94% 95%  Weight:      Height:  Constitutional: NAD, calm, comfortable Vitals:   04/21/21 1445 04/21/21 1500 04/21/21 1530 04/21/21 1615  BP: 132/72 128/74 (!) 147/63 130/78  Pulse: 65 60 64 67  Resp: 11 20 17 18   Temp:      TempSrc:      SpO2: 96% 95% 94% 95%  Weight:      Height:       Eyes: PERRL, lids and conjunctivae normal ENMT: Mucous membranes are moist. Posterior pharynx clear of any exudate or lesions.Normal dentition.  Neck: normal, supple, no masses, no thyromegaly Respiratory: clear to auscultation bilaterally, no wheezing, no crackles. Normal respiratory effort. No accessory muscle use.  Cardiovascular: Regular rate and rhythm, no murmurs / rubs / gallops. No extremity edema. 2+ pedal pulses. No carotid bruits.  Abdomen: no tenderness, no masses palpated. No hepatosplenomegaly. Bowel sounds positive.  Musculoskeletal: no clubbing / cyanosis. No joint deformity upper and lower extremities. Good ROM, no contractures. Normal muscle tone.  Skin: no rashes, lesions, ulcers. No induration Neurologic: CN 2-12 grossly intact. Sensation intact, DTR normal. Strength 5/5 in all 4.  Psychiatric: Normal judgment and insight. Alert and oriented x 3. Normal mood.    Labs on Admission: I have personally  reviewed following labs and imaging studies  CBC: Recent Labs  Lab 04/21/21 1055  WBC 9.4  HGB 12.9*  HCT 39.8  MCV 91.5  PLT 811    Basic Metabolic Panel: Recent Labs  Lab 04/21/21 1055  NA 143  K 3.3*  CL 106  CO2 27  GLUCOSE 95  BUN 11  CREATININE 1.36*  CALCIUM 9.1    GFR: Estimated Creatinine Clearance: 51 mL/min (A) (by C-G formula based on SCr of 1.36 mg/dL (H)).  Liver Function Tests: Recent Labs  Lab 04/21/21 1321  AST 23  ALT 20  ALKPHOS 44  BILITOT 0.6  PROT 6.2*  ALBUMIN 3.6    Urine analysis:    Component Value Date/Time   COLORURINE YELLOW 01/15/2021 1145   APPEARANCEUR CLEAR 01/15/2021 1145   LABSPEC 1.015 01/15/2021 1145   PHURINE 5.0 01/15/2021 1145   GLUCOSEU NEGATIVE 01/15/2021 1145   HGBUR NEGATIVE 01/15/2021 1145   Page 01/15/2021 1145   [place] 01/15/2021 1145   PROTEINUR NEGATIVE 01/15/2021 1145   NITRITE NEGATIVE 01/15/2021 1145   LEUKOCYTESUR NEGATIVE 01/15/2021 1145    Radiological Exams on Admission: DG Chest 2 View  Result Date: 04/21/2021 CLINICAL DATA:  Right-sided chest pain EXAM: CHEST - 2 VIEW COMPARISON:  Chest radiograph 03/04/2021 FINDINGS: Postsurgical change reflecting prior CABG are again seen. The cardiomediastinal silhouette is stable. There is no focal consolidation or pulmonary edema. There is no pleural effusion or pneumothorax. There is no acute osseous abnormality. There is multilevel degenerative change of the thoracic spine. An abdominal aortic endograft is noted. IMPRESSION: No radiographic evidence of acute cardiopulmonary process. Electronically Signed   By: Valetta Mole M.D.   On: 04/21/2021 11:22   CT CHEST ABDOMEN PELVIS W CONTRAST  Result Date: 04/21/2021 CLINICAL DATA:  Weight loss EXAM: CT CHEST, ABDOMEN, AND PELVIS WITH CONTRAST TECHNIQUE: Multidetector CT imaging of the chest, abdomen and pelvis was performed following the standard protocol during bolus administration  of intravenous contrast. RADIATION DOSE REDUCTION: This exam was performed according to the departmental dose-optimization program which includes automated exposure control, adjustment of the mA and/or kV according to patient size and/or use of iterative reconstruction technique. CONTRAST:  40mL OMNIPAQUE IOHEXOL 350 MG/ML SOLN COMPARISON:  CT chest, abdomen and pelvis dated Jul 07, 2019 FINDINGS: CT CHEST FINDINGS Cardiovascular: Normal heart size. No pericardial effusion. Left main and three-vessel coronary artery calcifications status post CABG. Atherosclerotic disease of the thoracic aorta. Dilated ascending thoracic aorta measuring up to 4.1 cm, unchanged when compared with prior exams. Mediastinum/Nodes: Focal masslike thickening of the mid/distal esophagus measuring up to 4.3 x 3.1 cm. Mildly enlarged paraesophageal lymph nodes. Reference paraesophageal lymph node measuring 1.1 cm in short axis on series 3, image 42. Esophagus is unremarkable. Lungs/Pleura: Central airways are patent. Paraseptal and centrilobular emphysema. No consolidation, pleural effusion or pneumothorax. Right lower lobe reticular opacities, unchanged compared to prior exams and likely due to scarring. Musculoskeletal: No chest wall mass or suspicious bone lesions identified. Prior median sternotomy with intact sternal wires. Old left-sided rib fractures. CT ABDOMEN PELVIS FINDINGS Hepatobiliary: No focal liver abnormality is seen. Status post cholecystectomy. No biliary dilatation. Pancreas: Unremarkable. No pancreatic ductal dilatation or surrounding inflammatory changes. Spleen: Normal in size without focal abnormality. Adrenals/Urinary Tract: Adrenal glands are unremarkable. Kidneys are normal, without renal calculi, focal lesion, or hydronephrosis. Bladder is unremarkable. Stomach/Bowel: Stomach is within normal limits. Diverticulosis primarily of the sigmoid colon. No evidence of bowel wall thickening, distention, or inflammatory  changes. Vascular/Lymphatic: Prior aorta bi-iliac endovascular repair of infrarenal abdominal aortic aneurysm, unchanged in appearance when compared with prior exam. No pathologically enlarged lymph nodes seen in the abdomen or pelvis. Reproductive: Mild prostatomegaly. Other: Small fat containing left inguinal hernia. No abdominopelvic ascites. Musculoskeletal: No acute or significant osseous findings. IMPRESSION: 1. Focal masslike thickening of the mid/distal esophagus which associated mildly enlarged paraesophageal lymph nodes, findings are concerning for primary esophageal malignancy. 2. No evidence of metastatic disease in the abdomen or pelvis. 3. Prior aorta bi-iliac endovascular repair of infrarenal abdominal aortic aneurysm, unchanged in appearance when compared with prior exam. 4. Sigmoid diverticulosis with no diverticulitis. 5. Aortic Atherosclerosis (ICD10-I70.0) and Emphysema (ICD10-J43.9). * onc * Electronically Signed   By: Yetta Glassman M.D.   On: 04/21/2021 16:20    EKG: Independently reviewed.  Normal sinus rhythm left axis deviation, Prolonged QT interval. Assessment/Plan Principal Problem:   Dysphagia Active Problems:   CAD (coronary artery disease), native coronary artery   Hyperlipidemia LDL goal <70   S/P AAA repair using bifurcation graft   Paroxysmal A-fib (HCC)    Dysphagia/odynophagia and right-sided chest pain CT of the chest abdomen pelvis showing focal masslike thickening of the mid/distal esophagus concerning for primary esophageal malignancy. Start the patient on clear liquid diet, n.p.o. after midnight for possible EGD in the morning Gently hydrate with normal saline at 75 mill per hour GI consulted will see the patient in the morning.    History of coronary artery disease s/p CABG November 2022 Patient reports right-sided chest pain, nonradiating and not associated with activity. Troponins are negative EKG does not show any ischemic changes. Holding  aspirin for now    Hypothyroidism TSH elevated, get free T4 and T3.  Hypokalemia Replaced, repeat labs in the morning.   Stage II CKD Baseline creatinine appears to be between 1.1-1.3 Currently creatinine around 1.3.  Hyperlipidemia Patient on statin at home to be resumed once EGD is done.    Paroxysmal atrial fibrillation Rate controlled. Patient not able to take pills by mouth due to dysphagia Patient currently not on anticoagulation.   Prolonged QTc Keep magnesium greater than 2 and potassium greater than 4 Repeat EKG in the morning.    DVT prophylaxis: Lovenox Code Status:   Full code Family Communication:  None at bedside  disposition Plan:   Patient is from:  Home  Anticipated DC to:  Home  Anticipated DC date: Tomorrow after EGD  Anticipated DC barriers: Persistent dysphagia Consults called:  Dr. Carlean Purl with GI Admission status:  MEDICAL floor/observation  Severity of Illness: The appropriate patient status for this patient is OBSERVATION. Observation status is judged to be reasonable and necessary in order to provide the required intensity of service to ensure the patient's safety. The patient's presenting symptoms, physical exam findings, and initial radiographic and laboratory data in the context of their medical condition is felt to place them at decreased risk for further clinical deterioration. Furthermore, it is anticipated that the patient will be medically stable for discharge from the hospital within 2 midnights of admission.     Hosie Poisson MD [facility]  How to contact the [facility] Attending or Consulting provider [place] or covering provider during after hours [place], for this patient?   Check the care team in [facility] and look for a) attending/consulting [facility] provider listed and b) the [facility] team listed Log into [email or web address] and use [facility]'s universal password to access. If you do not have the password, please contact the hospital  operator. Locate the [facility] provider you are looking for under [facility] and page to a number that you can be directly reached. If you still have difficulty reaching the provider, please page the [facility] (Director on Call) for the Hospitalists listed on amion for assistance.  04/21/2021, 5:20 PM

## 2021-04-21 NOTE — Consult Note (Signed)
Consultation  Referring Provider:     [facility] Dr. Karleen Hampshire Primary Care Physician:  Caren Macadam, MD Primary Gastroenterologist:        Carlean Purl Reason for Consultation:     Dysphagia, esophageal mass     Impression / Plan:   Clinical scenario compatible with esophageal cancer.  He needs an EGD for diagnostic purposes.  I will try to do that tomorrow though I am not confident that we have availability to make that happen.  I will have him keep n.p.o. after midnight just in case and we will sort through it if not able to do it tomorrow we will do it the following day, hopefully.  Other significant medical problems include coronary artery disease status post CABG, history of abdominal aortic aneurysm status post stenting, paroxysmal atrial fibrillation not on anticoagulation therapy.  He is on Lovenox for DVT prophylaxis and this does not need to be held for his EGD.   The risks and benefits as well as alternatives of endoscopic procedure(s) have been discussed and reviewed. All questions answered. The patient agrees to proceed.    Gatha Mayer, MD, [facility]             HPI:   Hector Morris is a 76 y.o. male being admitted with a history of progressive dysphagia and weight loss and a CT scan demonstrating an esophageal mass suspicious for cancer.  He has been having problems for several months, he had a CABG in November.  Progressive dysphagia now tolerating only things like ice cream with regularity.  No prior EGD.  I see him for a history of colon polyps and a performed colonoscopy, last of which was in 2018 and he had 2 adenomas removed maximum 7 mm, history of 2 adenomas in 2013 as well.  He has been having some intermittent chest pain issues with eating as well.  Weight is down at least 20 pounds over several months.  He says he was 217 pounds right after his surgery.  Wt Readings from Last 3 Encounters:  04/21/21 88.5 kg  03/04/21 94.3 kg  02/05/21 93.7 kg      Past Medical History:  Diagnosis Date   AAA (abdominal aortic aneurysm)    s/p EVAR 04/21/17   Allergy    Anxiety    Arthritis    Coronary artery disease    Depression    Dyspnea    with exertion   GERD (gastroesophageal reflux disease)    History of hiatal hernia    History of kidney stones    Hx of colonic polyps 03/31/2011   Hyperlipidemia    Hypothyroidism    Neuromuscular disorder ([place])    hiatal hernia   Sleep apnea    cpap   Vertigo     Past Surgical History:  Procedure Laterality Date   ABDOMINAL AORTIC ENDOVASCULAR STENT GRAFT N/A 04/21/2017   Procedure: ABDOMINAL AORTIC ENDOVASCULAR STENT GRAFT;  Surgeon: Serafina Mitchell, MD;  Location: [facility];  Service: Vascular;  Laterality: N/A;   APPENDECTOMY     CHOLECYSTECTOMY     COLONOSCOPY     last 05/2016   CORONARY ARTERY BYPASS GRAFT N/A 01/20/2021   Procedure: CORONARY ARTERY BYPASS GRAFTING (CABG) TIMES THREE USING LEFT INTERNAL MAMMARY ARTERY AND RIGHT GREATER SAPHENOUS VEIN HARVESTED ENDOSCOPICALLY.;  Surgeon: Lajuana Matte, MD;  Location: [facility];  Service: Open Heart Surgery;  Laterality: N/A;   ELBOW SURGERY Left    as teenager  ENDOVEIN HARVEST OF GREATER SAPHENOUS VEIN Right 01/20/2021   Procedure: ENDOVEIN HARVEST OF GREATER SAPHENOUS VEIN;  Surgeon: Lajuana Matte, MD;  Location: [facility];  Service: Open Heart Surgery;  Laterality: Right;   EYE SURGERY Bilateral 05/21/2020   Cataract Surgery   HERNIA REPAIR Right    inguinal   INTRAVASCULAR PRESSURE WIRE/FFR STUDY N/A 01/02/2021   Procedure: INTRAVASCULAR PRESSURE WIRE/FFR STUDY;  Surgeon: Martinique, Peter M, MD;  Location: [facility];  Service: Cardiovascular;  Laterality: N/A;   KNEE SURGERY Right    scope - cartilage removed   LEFT HEART CATH AND CORONARY ANGIOGRAPHY N/A 01/02/2021   Procedure: LEFT HEART CATH AND CORONARY ANGIOGRAPHY;  Surgeon: Martinique, Peter M, MD;  Location: [facility];  Service: Cardiovascular;   Laterality: N/A;   POLYPECTOMY  2018   TEE WITHOUT CARDIOVERSION N/A 01/20/2021   Procedure: TRANSESOPHAGEAL ECHOCARDIOGRAM (TEE);  Surgeon: Lajuana Matte, MD;  Location: [facility];  Service: Open Heart Surgery;  Laterality: N/A;   TONSILLECTOMY      Family History  Problem Relation Age of Onset   Heart murmur Mother    Heart attack Father    Stroke Sister    Obesity Brother    Colon cancer Neg Hx    Colon polyps Neg Hx     Social History   Tobacco Use   Smoking status: Former    Packs/day: 1.50    Years: 39.00    Pack years: 58.50    Types: Cigarettes    Quit date: 02/23/2006    Years since quitting: 15.1   Smokeless tobacco: Former    Types: Nurse, children's Use: Never used  Substance Use Topics   Alcohol use: Yes    Comment: occasional   Drug use: No    Prior to Admission medications   Medication Sig Start Date End Date Taking? Authorizing Provider  amiodarone (PACERONE) 200 MG tablet Take 1 tablet (200 mg total) by mouth 2 (two) times daily. 01/25/21   John Giovanni, PA-C  aspirin EC 325 MG EC tablet Take 1 tablet (325 mg total) by mouth daily. 01/25/21   Gold, Patrick Jupiter E, PA-C  capsaicin (ZOSTRIX) 0.025 % cream Apply 1 application topically 2 (two) times daily as needed (for feet).    [provider]  Cholecalciferol (VITAMIN D3) 125 MCG (5000 UT) CAPS Take 5,000 Units by mouth every morning.    [provider]  Cyanocobalamin (VITAMIN B-12 PO) Take by mouth daily.    [provider]  levothyroxine (SYNTHROID) 25 MCG tablet Take 1 tablet (25 mcg total) by mouth daily before breakfast. Patient taking differently: Take 12.5 mcg by mouth daily before breakfast. 11/08/19   [name], [name], MD  metoprolol tartrate (LOPRESSOR) 25 MG tablet Take 1 tablet (25 mg total) by mouth 2 (two) times daily. 01/25/21   Gold, Patrick Jupiter E, PA-C  oxyCODONE (OXY IR/ROXICODONE) 5 MG immediate release tablet Take 1 tablet (5 mg total) by mouth every 6 (six)  hours as needed for severe pain. 01/25/21   Gold, Wilder Glade, PA-C  PARoxetine (PAXIL) 20 MG tablet Take 10 mg by mouth in the morning and at bedtime.    [provider]  rosuvastatin (CRESTOR) 40 MG tablet Take 1 tablet (40 mg total) by mouth daily. 03/23/17   Hilty, Nadean Corwin, MD    Current Facility-Administered Medications  Medication Dose Route Frequency Provider Last Rate Last Admin   acetaminophen (TYLENOL) tablet 650  mg  650 mg Oral Q6H PRN Hosie Poisson, MD       Or   acetaminophen (TYLENOL) suppository 650 mg  650 mg Rectal Q6H PRN Hosie Poisson, MD       enoxaparin (LOVENOX) injection 40 mg  40 mg Subcutaneous Q24H Hosie Poisson, MD   40 mg at 04/21/21 1757   [START ON 04/22/2021] levothyroxine (SYNTHROID) tablet 12.5 mcg  12.5 mcg Oral Q0600 Hosie Poisson, MD       metoprolol tartrate (LOPRESSOR) tablet 25 mg  25 mg Oral BID Hosie Poisson, MD       ondansetron (ZOFRAN) injection 4 mg  4 mg Intravenous Q6H PRN Hosie Poisson, MD       pantoprazole sodium (PROTONIX) 40 mg/20 mL oral suspension 40 mg  40 mg Oral Daily Hosie Poisson, MD       potassium chloride 10 mEq in 100 mL IVPB  10 mEq Intravenous Q1 Hr x 2 Hosie Poisson, MD 100 mL/hr at 04/21/21 1756 10 mEq at 04/21/21 1756   Current Outpatient Medications  Medication Sig Dispense Refill   amiodarone (PACERONE) 200 MG tablet Take 1 tablet (200 mg total) by mouth 2 (two) times daily. 60 tablet 1   aspirin EC 325 MG EC tablet Take 1 tablet (325 mg total) by mouth daily.     capsaicin (ZOSTRIX) 0.025 % cream Apply 1 application topically 2 (two) times daily as needed (for feet).     Cholecalciferol (VITAMIN D3) 125 MCG (5000 UT) CAPS Take 5,000 Units by mouth every morning.     Cyanocobalamin (VITAMIN B-12 PO) Take by mouth daily.     levothyroxine (SYNTHROID) 25 MCG tablet Take 1 tablet (25 mcg total) by mouth daily before breakfast. (Patient taking differently: Take 12.5 mcg by mouth daily before breakfast.) 90 tablet 1    metoprolol tartrate (LOPRESSOR) 25 MG tablet Take 1 tablet (25 mg total) by mouth 2 (two) times daily. 60 tablet 1   oxyCODONE (OXY IR/ROXICODONE) 5 MG immediate release tablet Take 1 tablet (5 mg total) by mouth every 6 (six) hours as needed for severe pain. 28 tablet 0   PARoxetine (PAXIL) 20 MG tablet Take 10 mg by mouth in the morning and at bedtime.     rosuvastatin (CRESTOR) 40 MG tablet Take 1 tablet (40 mg total) by mouth daily. 90 tablet 3    Allergies as of 04/21/2021 - Review Complete 04/21/2021  Allergen Reaction Noted   Bee venom Anaphylaxis and Swelling 05/19/2016   Penicillins Anaphylaxis and Other (See Comments) 05/19/2016   Shellfish allergy Hives and Swelling 04/20/2017   Latex  05/19/2016     Review of Systems:    This is positive for those things mentioned in the HPI All other review of systems are negative.       Physical Exam:  Vital signs in last 24 hours: Temp:  [97.5 F (36.4 C)] 97.5 F (36.4 C) (02/27 1049) Pulse Rate:  [60-140] 67 (02/27 1615) Resp:  [11-26] 18 (02/27 1615) BP: (119-147)/(63-81) 130/78 (02/27 1615) SpO2:  [94 %-99 %] 95 % (02/27 1615) Weight:  [88.5 kg] 88.5 kg (02/27 1321)    General:  Well-developed, well-nourished and in no acute distress Eyes:  anicteric. ENT:   Mouth and posterior pharynx free of lesions.  Neck:   supple w/o thyromegaly or mass.  Lungs: Clear to auscultation bilaterally. Heart:   S1S2, no rubs, murmurs, gallops. Abdomen:  soft, non-tender, no hepatosplenomegaly, hernia, or mass and BS+.  Lymph:  no cervical or supraclavicular adenopathy. Extremities:   no edema Skin   no rash. Neuro:  A&O x 3.  Psych:  appropriate mood and  Affect.   Data Reviewed:   LAB RESULTS: Recent Labs    04/21/21 1055  WBC 9.4  HGB 12.9*  HCT 39.8  PLT 284   BMET Recent Labs    04/21/21 1055  NA 143  K 3.3*  CL 106  CO2 27  GLUCOSE 95  BUN 11  CREATININE 1.36*  CALCIUM 9.1   LFT Recent Labs     04/21/21 1321  PROT 6.2*  ALBUMIN 3.6  AST 23  ALT 20  ALKPHOS 44  BILITOT 0.6  BILIDIR 0.2  IBILI 0.4    STUDIES: DG Chest 2 View  Result Date: 04/21/2021 CLINICAL DATA:  Right-sided chest pain EXAM: CHEST - 2 VIEW COMPARISON:  Chest radiograph 03/04/2021 FINDINGS: Postsurgical change reflecting prior CABG are again seen. The cardiomediastinal silhouette is stable. There is no focal consolidation or pulmonary edema. There is no pleural effusion or pneumothorax. There is no acute osseous abnormality. There is multilevel degenerative change of the thoracic spine. An abdominal aortic endograft is noted. IMPRESSION: No radiographic evidence of acute cardiopulmonary process. Electronically Signed   By: Valetta Mole M.D.   On: 04/21/2021 11:22   CT CHEST ABDOMEN PELVIS W CONTRAST  Result Date: 04/21/2021 CLINICAL DATA:  Weight loss EXAM: CT CHEST, ABDOMEN, AND PELVIS WITH CONTRAST TECHNIQUE: Multidetector CT imaging of the chest, abdomen and pelvis was performed following the standard protocol during bolus administration of intravenous contrast. RADIATION DOSE REDUCTION: This exam was performed according to the departmental dose-optimization program which includes automated exposure control, adjustment of the mA and/or kV according to patient size and/or use of iterative reconstruction technique. CONTRAST:  75mL OMNIPAQUE IOHEXOL 350 MG/ML SOLN COMPARISON:  CT chest, abdomen and pelvis dated Jul 07, 2019 FINDINGS: CT CHEST FINDINGS Cardiovascular: Normal heart size. No pericardial effusion. Left main and three-vessel coronary artery calcifications status post CABG. Atherosclerotic disease of the thoracic aorta. Dilated ascending thoracic aorta measuring up to 4.1 cm, unchanged when compared with prior exams. Mediastinum/Nodes: Focal masslike thickening of the mid/distal esophagus measuring up to 4.3 x 3.1 cm. Mildly enlarged paraesophageal lymph nodes. Reference paraesophageal lymph node measuring 1.1  cm in short axis on series 3, image 42. Esophagus is unremarkable. Lungs/Pleura: Central airways are patent. Paraseptal and centrilobular emphysema. No consolidation, pleural effusion or pneumothorax. Right lower lobe reticular opacities, unchanged compared to prior exams and likely due to scarring. Musculoskeletal: No chest wall mass or suspicious bone lesions identified. Prior median sternotomy with intact sternal wires. Old left-sided rib fractures. CT ABDOMEN PELVIS FINDINGS Hepatobiliary: No focal liver abnormality is seen. Status post cholecystectomy. No biliary dilatation. Pancreas: Unremarkable. No pancreatic ductal dilatation or surrounding inflammatory changes. Spleen: Normal in size without focal abnormality. Adrenals/Urinary Tract: Adrenal glands are unremarkable. Kidneys are normal, without renal calculi, focal lesion, or hydronephrosis. Bladder is unremarkable. Stomach/Bowel: Stomach is within normal limits. Diverticulosis primarily of the sigmoid colon. No evidence of bowel wall thickening, distention, or inflammatory changes. Vascular/Lymphatic: Prior aorta bi-iliac endovascular repair of infrarenal abdominal aortic aneurysm, unchanged in appearance when compared with prior exam. No pathologically enlarged lymph nodes seen in the abdomen or pelvis. Reproductive: Mild prostatomegaly. Other: Small fat containing left inguinal hernia. No abdominopelvic ascites. Musculoskeletal: No acute or significant osseous findings. IMPRESSION: 1. Focal masslike thickening of the mid/distal esophagus which associated mildly enlarged paraesophageal lymph nodes, findings are concerning  for primary esophageal malignancy. 2. No evidence of metastatic disease in the abdomen or pelvis. 3. Prior aorta bi-iliac endovascular repair of infrarenal abdominal aortic aneurysm, unchanged in appearance when compared with prior exam. 4. Sigmoid diverticulosis with no diverticulitis. 5. Aortic Atherosclerosis (ICD10-I70.0) and  Emphysema (ICD10-J43.9). * onc * Electronically Signed   By: Yetta Glassman M.D.   On: 04/21/2021 16:20       Thanks   LOS: 0 days   @Leelan Rajewski  Simonne Maffucci, MD, [facility] @  04/21/2021, 6:54 PM

## 2021-04-21 NOTE — ED Triage Notes (Signed)
Patient coming from home, complaint of chest pain ongoing. Pt had open heart surgery in November.

## 2021-04-21 NOTE — ED Provider Notes (Signed)
[facility] EMERGENCY DEPARTMENT Provider Note   CSN: [id number] Arrival date & time: 04/21/21  1037     History  Chief Complaint  Patient presents with   Chest Pain    Hector Morris is a 76 y.o. male.  Pt is a 76 yo wm with a hx of CAD, sleep apnea, gerd, arthritis, kidney stones, hypothyroidism and AAA.  Pt had a CABG in November and is now getting cardiac rehab.  He is slowly improving.  He said he has been having trouble swallowing.  He feels like things are getting "hung up."  This has been going on for several months.  His wife thought it was due to his heart, but it has gotten worse since his surgery.  He said he can only eat yogurt and ensure.  He has also had some hiccups.  He has had a cough.  He had some severe tremors after surgery and was amiodarone.  He is now off the amiodarone and his tremors have resolved.  Pt's wife was trying to get pt an appt with Dr. Carlean Purl, but they could not see pt today.  Pcp could not see him either, so pt was told to come to the ED.      Home Medications Prior to Admission medications   Medication Sig Start Date End Date Taking? Authorizing Provider  amiodarone (PACERONE) 200 MG tablet Take 1 tablet (200 mg total) by mouth 2 (two) times daily. 01/25/21   John Giovanni, PA-C  aspirin EC 325 MG EC tablet Take 1 tablet (325 mg total) by mouth daily. 01/25/21   Gold, Patrick Jupiter E, PA-C  capsaicin (ZOSTRIX) 0.025 % cream Apply 1 application topically 2 (two) times daily as needed (for feet).    [provider]  Cholecalciferol (VITAMIN D3) 125 MCG (5000 UT) CAPS Take 5,000 Units by mouth every morning.    [provider]  Cyanocobalamin (VITAMIN B-12 PO) Take by mouth daily.    [provider]  levothyroxine (SYNTHROID) 25 MCG tablet Take 1 tablet (25 mcg total) by mouth daily before breakfast. Patient taking differently: Take 12.5 mcg by mouth daily before breakfast. 11/08/19   [name], [name], MD   metoprolol tartrate (LOPRESSOR) 25 MG tablet Take 1 tablet (25 mg total) by mouth 2 (two) times daily. 01/25/21   Gold, Patrick Jupiter E, PA-C  oxyCODONE (OXY IR/ROXICODONE) 5 MG immediate release tablet Take 1 tablet (5 mg total) by mouth every 6 (six) hours as needed for severe pain. 01/25/21   Gold, Wilder Glade, PA-C  PARoxetine (PAXIL) 20 MG tablet Take 10 mg by mouth in the morning and at bedtime.    [provider]  rosuvastatin (CRESTOR) 40 MG tablet Take 1 tablet (40 mg total) by mouth daily. 03/23/17   Hilty, Nadean Corwin, MD      Allergies    Bee venom, Penicillins, Shellfish allergy, and Latex    Review of Systems   Review of Systems  Gastrointestinal:        Hiccups, difficulty swallowing  All other systems reviewed and are negative.  Physical Exam Updated Vital Signs BP 130/78    Pulse 67    Temp (!) 97.5 F (36.4 C) (Oral)    Resp 18    Ht 5\' 10"  (1.778 m)    Wt 88.5 kg    SpO2 95%    BMI 27.98 kg/m  Physical Exam Vitals and nursing note reviewed.  Constitutional:      Appearance: He  is well-developed.  HENT:     Head: Normocephalic and atraumatic.  Eyes:     Extraocular Movements: Extraocular movements intact.     Pupils: Pupils are equal, round, and reactive to light.  Cardiovascular:     Rate and Rhythm: Normal rate and regular rhythm.     Heart sounds: Normal heart sounds.  Pulmonary:     Effort: Pulmonary effort is normal.     Breath sounds: Normal breath sounds.  Abdominal:     General: Bowel sounds are normal.     Palpations: Abdomen is soft.  Musculoskeletal:        General: Normal range of motion.     Cervical back: Normal range of motion and neck supple.  Skin:    General: Skin is warm.     Capillary Refill: Capillary refill takes less than 2 seconds.  Neurological:     General: No focal deficit present.     Mental Status: He is alert and oriented to person, place, and time.  Psychiatric:        Mood and Affect: Mood normal.        Behavior: Behavior  normal.    ED Results / Procedures / Treatments   Labs (all labs ordered are listed, but only abnormal results are displayed) Labs Reviewed  BASIC METABOLIC PANEL - Abnormal; Notable for the following components:      Result Value   Potassium 3.3 (*)    Creatinine, Ser 1.36 (*)    GFR, Estimated 54 (*)    All other components within normal limits  CBC - Abnormal; Notable for the following components:   Hemoglobin 12.9 (*)    All other components within normal limits  HEPATIC FUNCTION PANEL - Abnormal; Notable for the following components:   Total Protein 6.2 (*)    All other components within normal limits  TSH - Abnormal; Notable for the following components:   TSH 7.007 (*)    All other components within normal limits  RESP PANEL BY RT-PCR (FLU A&B, COVID) [id number]  TROPONIN I (HIGH SENSITIVITY)  TROPONIN I (HIGH SENSITIVITY)    EKG EKG Interpretation  Date/Time:  Monday April 21 2021 10:54:08 EST Ventricular Rate:  73 PR Interval:  200 QRS Duration: 88 QT Interval:  452 QTC Calculation: 497 R Axis:   -43 Text Interpretation: Normal sinus rhythm Left axis deviation Prolonged QT Abnormal ECG When compared with ECG of 21-Jan-2021 06:52, PREVIOUS ECG IS PRESENT No significant change since last tracing Confirmed by Isla Pence [phone]) on 04/21/2021 12:54:09 PM  Radiology DG Chest 2 View  Result Date: 04/21/2021 CLINICAL DATA:  Right-sided chest pain EXAM: CHEST - 2 VIEW COMPARISON:  Chest radiograph 03/04/2021 FINDINGS: Postsurgical change reflecting prior CABG are again seen. The cardiomediastinal silhouette is stable. There is no focal consolidation or pulmonary edema. There is no pleural effusion or pneumothorax. There is no acute osseous abnormality. There is multilevel degenerative change of the thoracic spine. An abdominal aortic endograft is noted. IMPRESSION: No radiographic evidence of acute cardiopulmonary process. Electronically Signed   By: Valetta Mole M.D.    On: 04/21/2021 11:22   CT CHEST ABDOMEN PELVIS W CONTRAST  Result Date: 04/21/2021 CLINICAL DATA:  Weight loss EXAM: CT CHEST, ABDOMEN, AND PELVIS WITH CONTRAST TECHNIQUE: Multidetector CT imaging of the chest, abdomen and pelvis was performed following the standard protocol during bolus administration of intravenous contrast. RADIATION DOSE REDUCTION: This exam was performed according to the departmental dose-optimization program which includes automated exposure  control, adjustment of the mA and/or kV according to patient size and/or use of iterative reconstruction technique. CONTRAST:  42mL OMNIPAQUE IOHEXOL 350 MG/ML SOLN COMPARISON:  CT chest, abdomen and pelvis dated Jul 07, 2019 FINDINGS: CT CHEST FINDINGS Cardiovascular: Normal heart size. No pericardial effusion. Left main and three-vessel coronary artery calcifications status post CABG. Atherosclerotic disease of the thoracic aorta. Dilated ascending thoracic aorta measuring up to 4.1 cm, unchanged when compared with prior exams. Mediastinum/Nodes: Focal masslike thickening of the mid/distal esophagus measuring up to 4.3 x 3.1 cm. Mildly enlarged paraesophageal lymph nodes. Reference paraesophageal lymph node measuring 1.1 cm in short axis on series 3, image 42. Esophagus is unremarkable. Lungs/Pleura: Central airways are patent. Paraseptal and centrilobular emphysema. No consolidation, pleural effusion or pneumothorax. Right lower lobe reticular opacities, unchanged compared to prior exams and likely due to scarring. Musculoskeletal: No chest wall mass or suspicious bone lesions identified. Prior median sternotomy with intact sternal wires. Old left-sided rib fractures. CT ABDOMEN PELVIS FINDINGS Hepatobiliary: No focal liver abnormality is seen. Status post cholecystectomy. No biliary dilatation. Pancreas: Unremarkable. No pancreatic ductal dilatation or surrounding inflammatory changes. Spleen: Normal in size without focal abnormality.  Adrenals/Urinary Tract: Adrenal glands are unremarkable. Kidneys are normal, without renal calculi, focal lesion, or hydronephrosis. Bladder is unremarkable. Stomach/Bowel: Stomach is within normal limits. Diverticulosis primarily of the sigmoid colon. No evidence of bowel wall thickening, distention, or inflammatory changes. Vascular/Lymphatic: Prior aorta bi-iliac endovascular repair of infrarenal abdominal aortic aneurysm, unchanged in appearance when compared with prior exam. No pathologically enlarged lymph nodes seen in the abdomen or pelvis. Reproductive: Mild prostatomegaly. Other: Small fat containing left inguinal hernia. No abdominopelvic ascites. Musculoskeletal: No acute or significant osseous findings. IMPRESSION: 1. Focal masslike thickening of the mid/distal esophagus which associated mildly enlarged paraesophageal lymph nodes, findings are concerning for primary esophageal malignancy. 2. No evidence of metastatic disease in the abdomen or pelvis. 3. Prior aorta bi-iliac endovascular repair of infrarenal abdominal aortic aneurysm, unchanged in appearance when compared with prior exam. 4. Sigmoid diverticulosis with no diverticulitis. 5. Aortic Atherosclerosis (ICD10-I70.0) and Emphysema (ICD10-J43.9). * onc * Electronically Signed   By: Yetta Glassman M.D.   On: 04/21/2021 16:20    Procedures Procedures    Medications Ordered in ED Medications  0.9 %  sodium chloride infusion (has no administration in time range)  potassium chloride 10 mEq in 100 mL IVPB (has no administration in time range)  alum & mag hydroxide-simeth (MAALOX/MYLANTA) 200-200-20 MG/5ML suspension 30 mL (30 mLs Oral Given 04/21/21 1328)  iohexol (OMNIPAQUE) 350 MG/ML injection 80 mL (80 mLs Intravenous Contrast Given 04/21/21 1553)    ED Course/ Medical Decision Making/ A&P                           Medical Decision Making Amount and/or Complexity of Data Reviewed Labs: ordered. Radiology: ordered.  Risk OTC  drugs. Prescription drug management. Decision regarding hospitalization.   This patient presents to the ED for concern of dysphagia, this involves an extensive number of treatment options, and is a complaint that carries with it a high risk of complications and morbidity.  The differential diagnosis includes esophageal stricture, esophageal cancer   Co morbidities that complicate the patient evaluation  CAD, sleep apnea, gerd, arthritis, kidney stones, hypothyroidism and AAA   Additional history obtained:  Additional history obtained from epic chart review External records from outside source obtained and reviewed including wife   Lab Tests:  I Ordered, and personally interpreted labs.  The pertinent results include:  cbc nl, cmp nl, trop nl   Imaging Studies ordered:  I ordered imaging studies including ct abd/pelvis  I independently visualized and interpreted imaging which showed    IMPRESSION:  1. Focal masslike thickening of the mid/distal esophagus which  associated mildly enlarged paraesophageal lymph nodes, findings are  concerning for primary esophageal malignancy.  2. No evidence of metastatic disease in the abdomen or pelvis.  3. Prior aorta bi-iliac endovascular repair of infrarenal abdominal  aortic aneurysm, unchanged in appearance when compared with prior  exam.  4. Sigmoid diverticulosis with no diverticulitis.  5. Aortic Atherosclerosis (ICD10-I70.0) and Emphysema (ICD10-J43.9).   I agree with the radiologist interpretation   Cardiac Monitoring:  The patient was maintained on a cardiac monitor.  I personally viewed and interpreted the cardiac monitored which showed an underlying rhythm of: nsr   Medicines ordered and prescription drug management:  I ordered medication including gi cocktail  for dysphagia  Reevaluation of the patient after these medicines showed that the patient stayed the same I have reviewed the patients home medicines and have  made adjustments as needed   Test Considered:  Ct abd/pelvis    Consultations Obtained:  I requested consultation with the gastroenterologist (Dr. Carlean Purl),  and discussed lab and imaging findings as well as pertinent plan - they recommend: admission to medicine for EGD in the morning. Dr. Karleen Hampshire (hospitalist) will admit.   Problem List / ED Course:  Dysphagia:  due to esophageal mass.  GI will do an endoscopy tomorrow.  We will keep overnight for IVFs.   Reevaluation:  After the interventions noted above, I reevaluated the patient and found that they have :improved   Social Determinants of Health:  Lives at home with his wife   Dispostion:  After consideration of the diagnostic results and the patients response to treatment, I feel that the patent would benefit from admission.          Final Clinical Impression(s) / ED Diagnoses Final diagnoses:  Dysphagia  Esophageal mass  Dehydration    Rx / DC Orders ED Discharge Orders          Ordered    Dexlansoprazole (DEXILANT) 30 MG capsule DR  Daily,   Status:  Discontinued        04/21/21 1526              Isla Pence, MD 04/21/21 1643

## 2021-04-21 NOTE — Progress Notes (Signed)
Asked to see pt re CT finding of esophageal thickeining and adenopathy concerning for Cancer.   Will plan to see pt by tmrw AM  Azucena Freed PA-C

## 2021-04-22 ENCOUNTER — Encounter (HOSPITAL_COMMUNITY): Payer: Self-pay | Admitting: Internal Medicine

## 2021-04-22 ENCOUNTER — Observation Stay (HOSPITAL_BASED_OUTPATIENT_CLINIC_OR_DEPARTMENT_OTHER): Payer: Medicare Other | Admitting: Registered Nurse

## 2021-04-22 ENCOUNTER — Encounter (HOSPITAL_COMMUNITY)
Admission: EM | Disposition: A | Discharge status: Home / Self Care | Payer: Self-pay | Source: Home / Self Care | Attending: Internal Medicine

## 2021-04-22 ENCOUNTER — Observation Stay (HOSPITAL_COMMUNITY): Payer: Medicare Other | Admitting: Registered Nurse

## 2021-04-22 DIAGNOSIS — I480 Paroxysmal atrial fibrillation: Secondary | ICD-10-CM | POA: Diagnosis present

## 2021-04-22 DIAGNOSIS — Z79890 Hormone replacement therapy (postmenopausal): Secondary | ICD-10-CM | POA: Diagnosis not present

## 2021-04-22 DIAGNOSIS — Z91030 Bee allergy status: Secondary | ICD-10-CM | POA: Diagnosis not present

## 2021-04-22 DIAGNOSIS — R9431 Abnormal electrocardiogram [ECG] [EKG]: Secondary | ICD-10-CM | POA: Diagnosis present

## 2021-04-22 DIAGNOSIS — G4733 Obstructive sleep apnea (adult) (pediatric): Secondary | ICD-10-CM

## 2021-04-22 DIAGNOSIS — I25119 Atherosclerotic heart disease of native coronary artery with unspecified angina pectoris: Secondary | ICD-10-CM | POA: Diagnosis not present

## 2021-04-22 DIAGNOSIS — Z9049 Acquired absence of other specified parts of digestive tract: Secondary | ICD-10-CM | POA: Diagnosis not present

## 2021-04-22 DIAGNOSIS — R1319 Other dysphagia: Secondary | ICD-10-CM | POA: Diagnosis not present

## 2021-04-22 DIAGNOSIS — Z7982 Long term (current) use of aspirin: Secondary | ICD-10-CM | POA: Diagnosis not present

## 2021-04-22 DIAGNOSIS — K219 Gastro-esophageal reflux disease without esophagitis: Secondary | ICD-10-CM | POA: Diagnosis present

## 2021-04-22 DIAGNOSIS — R1310 Dysphagia, unspecified: Secondary | ICD-10-CM

## 2021-04-22 DIAGNOSIS — Z800 Family history of malignant neoplasm of digestive organs: Secondary | ICD-10-CM | POA: Diagnosis not present

## 2021-04-22 DIAGNOSIS — Z8249 Family history of ischemic heart disease and other diseases of the circulatory system: Secondary | ICD-10-CM | POA: Diagnosis not present

## 2021-04-22 DIAGNOSIS — K2289 Other specified disease of esophagus: Secondary | ICD-10-CM | POA: Diagnosis not present

## 2021-04-22 DIAGNOSIS — Z20822 Contact with and (suspected) exposure to covid-19: Secondary | ICD-10-CM | POA: Diagnosis present

## 2021-04-22 DIAGNOSIS — Z86010 Personal history of colonic polyps: Secondary | ICD-10-CM | POA: Diagnosis not present

## 2021-04-22 DIAGNOSIS — C154 Malignant neoplasm of middle third of esophagus: Principal | ICD-10-CM

## 2021-04-22 DIAGNOSIS — I10 Essential (primary) hypertension: Secondary | ICD-10-CM | POA: Diagnosis not present

## 2021-04-22 DIAGNOSIS — Z91040 Latex allergy status: Secondary | ICD-10-CM | POA: Diagnosis not present

## 2021-04-22 DIAGNOSIS — Z951 Presence of aortocoronary bypass graft: Secondary | ICD-10-CM | POA: Diagnosis not present

## 2021-04-22 DIAGNOSIS — K449 Diaphragmatic hernia without obstruction or gangrene: Secondary | ICD-10-CM

## 2021-04-22 DIAGNOSIS — Z95828 Presence of other vascular implants and grafts: Secondary | ICD-10-CM | POA: Diagnosis not present

## 2021-04-22 DIAGNOSIS — Z8679 Personal history of other diseases of the circulatory system: Secondary | ICD-10-CM | POA: Diagnosis not present

## 2021-04-22 DIAGNOSIS — Z87891 Personal history of nicotine dependence: Secondary | ICD-10-CM | POA: Diagnosis not present

## 2021-04-22 DIAGNOSIS — Z91013 Allergy to seafood: Secondary | ICD-10-CM | POA: Diagnosis not present

## 2021-04-22 DIAGNOSIS — E039 Hypothyroidism, unspecified: Secondary | ICD-10-CM | POA: Diagnosis present

## 2021-04-22 DIAGNOSIS — E876 Hypokalemia: Secondary | ICD-10-CM | POA: Diagnosis present

## 2021-04-22 DIAGNOSIS — E860 Dehydration: Secondary | ICD-10-CM | POA: Diagnosis present

## 2021-04-22 DIAGNOSIS — I2510 Atherosclerotic heart disease of native coronary artery without angina pectoris: Secondary | ICD-10-CM | POA: Diagnosis present

## 2021-04-22 DIAGNOSIS — Z823 Family history of stroke: Secondary | ICD-10-CM | POA: Diagnosis not present

## 2021-04-22 DIAGNOSIS — E785 Hyperlipidemia, unspecified: Secondary | ICD-10-CM | POA: Diagnosis present

## 2021-04-22 DIAGNOSIS — Z880 Allergy status to penicillin: Secondary | ICD-10-CM | POA: Diagnosis not present

## 2021-04-22 HISTORY — PX: ESOPHAGOGASTRODUODENOSCOPY (EGD) WITH PROPOFOL: SHX5813

## 2021-04-22 HISTORY — PX: BIOPSY: SHX5522

## 2021-04-22 LAB — CBC
HCT: 37.6 % — ABNORMAL LOW (ref 39.0–52.0)
Hemoglobin: 12.6 g/dL — ABNORMAL LOW (ref 13.0–17.0)
MCH: 30 pg (ref 26.0–34.0)
MCHC: 33.5 g/dL (ref 30.0–36.0)
MCV: 89.5 fL (ref 80.0–100.0)
Platelets: 256 10*3/uL (ref 150–400)
RBC: 4.2 MIL/uL — ABNORMAL LOW (ref 4.22–5.81)
RDW: 13.9 % (ref 11.5–15.5)
WBC: 9.5 10*3/uL (ref 4.0–10.5)
nRBC: 0 % (ref 0.0–0.2)

## 2021-04-22 LAB — COMPREHENSIVE METABOLIC PANEL
ALT: 19 U/L (ref 0–44)
AST: 23 U/L (ref 15–41)
Albumin: 3.5 g/dL (ref 3.5–5.0)
Alkaline Phosphatase: 43 U/L (ref 38–126)
Anion gap: 10 (ref 5–15)
BUN: 9 mg/dL (ref 8–23)
CO2: 25 mmol/L (ref 22–32)
Calcium: 9 mg/dL (ref 8.9–10.3)
Chloride: 106 mmol/L (ref 98–111)
Creatinine, Ser: 1.24 mg/dL (ref 0.61–1.24)
GFR, Estimated: 60 mL/min — ABNORMAL LOW (ref >60)
Glucose, Bld: 98 mg/dL (ref 70–99)
Potassium: 3.4 mmol/L — ABNORMAL LOW (ref 3.5–5.1)
Sodium: 141 mmol/L (ref 135–145)
Total Bilirubin: 0.6 mg/dL (ref 0.3–1.2)
Total Protein: 6.3 g/dL — ABNORMAL LOW (ref 6.5–8.1)

## 2021-04-22 LAB — T4, FREE: Free T4: 0.93 ng/dL (ref 0.61–1.12)

## 2021-04-22 SURGERY — ESOPHAGOGASTRODUODENOSCOPY (EGD) WITH PROPOFOL
Anesthesia: Monitor Anesthesia Care

## 2021-04-22 MED ORDER — PROPOFOL 500 MG/50ML IV EMUL
INTRAVENOUS | Status: DC | PRN
  Administered 2021-04-22: 75 ug/kg/min via INTRAVENOUS

## 2021-04-22 MED ORDER — LIDOCAINE 2% (20 MG/ML) 5 ML SYRINGE
INTRAMUSCULAR | Status: DC | PRN
  Administered 2021-04-22: 60 mg via INTRAVENOUS

## 2021-04-22 MED ORDER — SODIUM CHLORIDE 0.9 % IV SOLN: INTRAVENOUS | Status: DC

## 2021-04-22 MED ORDER — PROPOFOL 10 MG/ML IV BOLUS
INTRAVENOUS | Status: DC | PRN
  Administered 2021-04-22: 20 mg via INTRAVENOUS
  Administered 2021-04-22: 30 mg via INTRAVENOUS

## 2021-04-22 MED ORDER — LACTATED RINGERS IV SOLN: INTRAVENOUS | Status: DC | PRN

## 2021-04-22 MED ORDER — POTASSIUM CHLORIDE CRYS ER 20 MEQ PO TBCR
40.0000 meq | EXTENDED_RELEASE_TABLET | Freq: Once | ORAL | Status: AC
  Administered 2021-04-22: 40 meq via ORAL
  Filled 2021-04-22: qty 2

## 2021-04-22 SURGICAL SUPPLY — 15 items

## 2021-04-22 NOTE — Progress Notes (Signed)
[facility]                                                                               Hector Morris, is a 76 y.o. male, DOB - October 31, 1945, CZY:[id number] Admit date - 04/21/2021    Outpatient Primary MD for the patient is Caren Macadam, MD  LOS - 0  days    Brief summary   Hector Morris is a 76 y.o. male with medical history significant of AAA s/p repair in 2019, CAD, CABG in November 2022., hyperlipidemia, hypothyroidism, presents to ED for progressive difficulty swallowing water.  Of several months, along with right-sided chest pain nonradiating, associated with nausea and vomiting progressively gets worse after eating. On arrival to ED, he appears dehydrated. CT chest abdomen and pelvis done showing focal mass like thickening of the mid distal esophagus concerning for primary esophageal malignancy. GI consulted and plan for EGD today.    Assessment & Plan    Assessment and Plan: * Dysphagia CT of the chest abdomen pelvis showing focal masslike thickening of the mid/distal esophagus concerning for primary esophageal malignancy. Start the patient on clear liquid diet, n.p.o. after midnight for possible EGD today.  Gently hydrate with normal saline at 75 mill per hour GI consulted and following.   Hypokalemia- (present on admission) Replaced.  Paroxysmal A-fib ([place])- (present on admission) Rate controlled. Patient not able to take pills by mouth due to dysphagia Patient currently not on anticoagulation.    CAD (coronary artery disease), native coronary artery- (present on admission) Patient reports right-sided chest pain, nonradiating and not associated with activity. Troponins are negative EKG does not show any ischemic changes. Holding aspirin for now             Estimated body mass index is 27.98 kg/m as calculated from the following:   Height as of this encounter: 5\' 10"  (1.778 m).   Weight as of this encounter: 88.5 kg.  Code  Status: FULL CODE.  DVT Prophylaxis:  enoxaparin (LOVENOX) injection 40 mg Start: 04/21/21 1715   Level of Care: Level of care: Med-Surg Family Communication: none at bedside.   Disposition Plan:     Remains inpatient appropriate:  EGD today.   Procedures:  Plan for EGD.   Consultants:   GI with Dr Carlean Purl.   Antimicrobials:   Anti-infectives (From admission, onward)    None        Medications  Scheduled Meds:  [MAR Hold] enoxaparin (LOVENOX) injection  40 mg Subcutaneous Q24H   [MAR Hold] levothyroxine  12.5 mcg Oral Q0600   [MAR Hold] metoprolol tartrate  25 mg Oral BID   [MAR Hold] pantoprazole sodium  40 mg Oral Daily   Continuous Infusions:  sodium chloride     PRN Meds:.[MAR Hold] acetaminophen **OR** [MAR Hold] acetaminophen, [MAR Hold] ondansetron (ZOFRAN) IV    Subjective:   Hector Morris was seen and examined today.  No new complaints this morning.   Objective:   Vitals:   04/22/21 0540 04/22/21 0800 04/22/21 1011 04/22/21 1104  BP: 122/75 (!) 143/93 (!) 148/72 (!) 147/64  Pulse: 80 73 74 74  Resp: 18 16 14  (!) 21  Temp:    (!) 97.2 F (36.2 C)  TempSrc:    Temporal  SpO2: 92% 94% 97% 95%  Weight:      Height:        Intake/Output Summary (Last 24 hours) at 04/22/2021 1159 Last data filed at 04/21/2021 2052 Gross per 24 hour  Intake 314.7 ml  Output --  Net 314.7 ml   Filed Weights   04/21/21 1321  Weight: 88.5 kg     Exam General exam: Appears calm and comfortable  Respiratory system: Clear to auscultation. Respiratory effort normal. Cardiovascular system: S1 & S2 heard, RRR. No JVD, No pedal edema. Gastrointestinal system: Abdomen is nondistended, soft and nontender.  Normal bowel sounds heard. Central nervous system: Alert and oriented. No focal neurological deficits. Extremities: Symmetric 5 x 5 power. Skin: No rashes, lesions or ulcers Psychiatry: Mood & affect appropriate.     Data Reviewed:  I have personally reviewed  following labs and imaging studies   CBC Lab Results  Component Value Date   WBC 9.5 04/22/2021   RBC 4.20 (L) 04/22/2021   HGB 12.6 (L) 04/22/2021   HCT 37.6 (L) 04/22/2021   MCV 89.5 04/22/2021   MCH 30.0 04/22/2021   PLT 256 04/22/2021   MCHC 33.5 04/22/2021   RDW 13.9 04/22/2021   LYMPHSABS 1.2 12/31/2020   MONOABS 0.7 08/16/2020   EOSABS 0.2 12/31/2020   BASOSABS 0.1 61/44/3154     Last metabolic panel Lab Results  Component Value Date   NA 141 04/22/2021   K 3.4 (L) 04/22/2021   CL 106 04/22/2021   CO2 25 04/22/2021   BUN 9 04/22/2021   CREATININE 1.24 04/22/2021   GLUCOSE 98 04/22/2021   GFRNONAA 60 (L) 04/22/2021   GFRAA >60 07/07/2019   CALCIUM 9.0 04/22/2021   PHOS 2.8 01/22/2021   PROT 6.3 (L) 04/22/2021   ALBUMIN 3.5 04/22/2021   BILITOT 0.6 04/22/2021   ALKPHOS 43 04/22/2021   AST 23 04/22/2021   ALT 19 04/22/2021   ANIONGAP 10 04/22/2021    CBG (last 3)  No results for input(s): GLUCAP in the last 72 hours.    Coagulation Profile: No results for input(s): INR, PROTIME in the last 168 hours.   Radiology Studies: DG Chest 2 View  Result Date: 04/21/2021 CLINICAL DATA:  Right-sided chest pain EXAM: CHEST - 2 VIEW COMPARISON:  Chest radiograph 03/04/2021 FINDINGS: Postsurgical change reflecting prior CABG are again seen. The cardiomediastinal silhouette is stable. There is no focal consolidation or pulmonary edema. There is no pleural effusion or pneumothorax. There is no acute osseous abnormality. There is multilevel degenerative change of the thoracic spine. An abdominal aortic endograft is noted. IMPRESSION: No radiographic evidence of acute cardiopulmonary process. Electronically Signed   By: Valetta Mole M.D.   On: 04/21/2021 11:22   CT CHEST ABDOMEN PELVIS W CONTRAST  Result Date: 04/21/2021 CLINICAL DATA:  Weight loss EXAM: CT CHEST, ABDOMEN, AND PELVIS WITH CONTRAST TECHNIQUE: Multidetector CT imaging of the chest, abdomen and pelvis was  performed following the standard protocol during bolus administration of intravenous contrast. RADIATION DOSE REDUCTION: This exam was performed according to the departmental dose-optimization program which includes automated exposure control, adjustment of the mA and/or kV according to patient size and/or use of iterative reconstruction technique. CONTRAST:  49mL OMNIPAQUE IOHEXOL 350 MG/ML SOLN COMPARISON:  CT chest, abdomen and pelvis dated Jul 07, 2019 FINDINGS: CT CHEST FINDINGS Cardiovascular: Normal heart size. No pericardial effusion. Left main and three-vessel coronary artery  calcifications status post CABG. Atherosclerotic disease of the thoracic aorta. Dilated ascending thoracic aorta measuring up to 4.1 cm, unchanged when compared with prior exams. Mediastinum/Nodes: Focal masslike thickening of the mid/distal esophagus measuring up to 4.3 x 3.1 cm. Mildly enlarged paraesophageal lymph nodes. Reference paraesophageal lymph node measuring 1.1 cm in short axis on series 3, image 42. Esophagus is unremarkable. Lungs/Pleura: Central airways are patent. Paraseptal and centrilobular emphysema. No consolidation, pleural effusion or pneumothorax. Right lower lobe reticular opacities, unchanged compared to prior exams and likely due to scarring. Musculoskeletal: No chest wall mass or suspicious bone lesions identified. Prior median sternotomy with intact sternal wires. Old left-sided rib fractures. CT ABDOMEN PELVIS FINDINGS Hepatobiliary: No focal liver abnormality is seen. Status post cholecystectomy. No biliary dilatation. Pancreas: Unremarkable. No pancreatic ductal dilatation or surrounding inflammatory changes. Spleen: Normal in size without focal abnormality. Adrenals/Urinary Tract: Adrenal glands are unremarkable. Kidneys are normal, without renal calculi, focal lesion, or hydronephrosis. Bladder is unremarkable. Stomach/Bowel: Stomach is within normal limits. Diverticulosis primarily of the sigmoid  colon. No evidence of bowel wall thickening, distention, or inflammatory changes. Vascular/Lymphatic: Prior aorta bi-iliac endovascular repair of infrarenal abdominal aortic aneurysm, unchanged in appearance when compared with prior exam. No pathologically enlarged lymph nodes seen in the abdomen or pelvis. Reproductive: Mild prostatomegaly. Other: Small fat containing left inguinal hernia. No abdominopelvic ascites. Musculoskeletal: No acute or significant osseous findings. IMPRESSION: 1. Focal masslike thickening of the mid/distal esophagus which associated mildly enlarged paraesophageal lymph nodes, findings are concerning for primary esophageal malignancy. 2. No evidence of metastatic disease in the abdomen or pelvis. 3. Prior aorta bi-iliac endovascular repair of infrarenal abdominal aortic aneurysm, unchanged in appearance when compared with prior exam. 4. Sigmoid diverticulosis with no diverticulitis. 5. Aortic Atherosclerosis (ICD10-I70.0) and Emphysema (ICD10-J43.9). * onc * Electronically Signed   By: Yetta Glassman M.D.   On: 04/21/2021 16:20       Hosie Poisson M.D. [facility] 04/22/2021, 11:59 AM  Available via Epic secure chat 7am-7pm After 7 pm, please refer to night coverage provider listed on amion.

## 2021-04-22 NOTE — Anesthesia Preprocedure Evaluation (Signed)
Anesthesia Evaluation  Patient identified by MRN, date of birth, ID band Patient awake    Reviewed: Allergy & Precautions, NPO status , Patient's Chart, lab work & pertinent test results, reviewed documented beta blocker date and time   Airway Mallampati: I  TM Distance: >3 FB Neck ROM: Full    Dental  (+) Dental Advisory Given, Lower Dentures, Upper Dentures   Pulmonary shortness of breath and with exertion, sleep apnea and Continuous Positive Airway Pressure Ventilation , former smoker,    Pulmonary exam normal breath sounds clear to auscultation       Cardiovascular hypertension, Pt. on home beta blockers + angina with exertion + CAD, + CABG and + Peripheral Vascular Disease (s/p EVAR)  Normal cardiovascular exam Rhythm:Regular Rate:Normal  TAA (3.9 cm ascending aorta 12/27/20 CCTA)  Echo 01/10/21: IMPRESSIONS  1. Hypokinesis of the inferolateral and basal inferior wall with overall mild LV dysfunction.  2. Left ventricular ejection fraction, by estimation, is 45 to 50%. The left ventricle has mildly decreased function. The left ventricle demonstrates regional wall motion abnormalities (see scoring diagram/findings for description). There is moderate left ventricular hypertrophy. Left ventricular diastolic parameters are  consistent with Grade I diastolic dysfunction (impaired relaxation).  3. Right ventricular systolic function is normal. The right ventricular size is normal. There is normal pulmonary artery systolic pressure.  4. The mitral valve is normal in structure. Mild to moderate mitral valve regurgitation. No evidence of mitral stenosis.  5. The aortic valve is tricuspid. Aortic valve regurgitation is trivial.  Aortic valve sclerosis is present, with no evidence of aortic valve  stenosis.  6. Aortic dilatation noted. There is borderline dilatation of the aortic root, measuring 38 mm. There is mild dilatation of the  ascending aorta, measuring 44 mm.  7. The inferior vena cava is normal in size with greater than 50% respiratory variability, suggesting right atrial pressure of 3 mmHg.  - Comparison(s): No prior Echocardiogram.   S/P endovascular AAA repair/stent 04/21/2017  EKG 2/27//23 NSR, LAD  S/P CABG 3v 12/2020   Neuro/Psych PSYCHIATRIC DISORDERS Anxiety Depression negative neurological ROS     GI/Hepatic Neg liver ROS, hiatal hernia, GERD  Medicated,Esophageal Mass Dysphagia   Endo/Other  Hypothyroidism Obesity   Renal/GU negative Renal ROS  negative genitourinary   Musculoskeletal  (+) Arthritis , Osteoarthritis,    Abdominal   Peds  Hematology negative hematology ROS (+)   Anesthesia Other Findings   Reproductive/Obstetrics                             Anesthesia Physical  Anesthesia Plan  ASA: 4  Anesthesia Plan: General   Post-op Pain Management:    Induction: Intravenous  PONV Risk Score and Plan: 2 and Treatment may vary due to age or medical condition and Midazolam  Airway Management Planned: Oral ETT  Additional Equipment: Arterial line, CVP, TEE and Ultrasound Guidance Line Placement  Intra-op Plan:   Post-operative Plan: Post-operative intubation/ventilation  Informed Consent: I have reviewed the patients History and Physical, chart, labs and discussed the procedure including the risks, benefits and alternatives for the proposed anesthesia with the patient or authorized representative who has indicated his/her understanding and acceptance.     Dental advisory given  Plan Discussed with: CRNA  Anesthesia Plan Comments: (PAT note written 01/15/2021 by Myra Gianotti, PA-C. )        Anesthesia Quick Evaluation

## 2021-04-22 NOTE — ED Notes (Signed)
Pt resting quietly in bed with eyes closed. Respirations even and unlabored. No S/S of distress noted.

## 2021-04-22 NOTE — Interval H&P Note (Signed)
History and Physical Interval Note:  04/22/2021 11:48 AM  Hector Morris  has presented today for surgery, with the diagnosis of esophageal mass and dysphagia.  The various methods of treatment have been discussed with the patient and family. After consideration of risks, benefits and other options for treatment, the patient has consented to  Procedure(s) with comments: ESOPHAGOGASTRODUODENOSCOPY (EGD) WITH PROPOFOL (N/A) - would consider moderate sedation as a surgical intervention.  The patient's history has been reviewed, patient examined, no change in status, stable for surgery.  I have reviewed the patient's chart and labs.  Questions were answered to the patient's satisfaction.     Silvano Rusk

## 2021-04-22 NOTE — Progress Notes (Signed)
Pt arrived to floor from Endo around 1315 via stretcher. Pt alert and oriented x4 in no acute distress. VSS. Respirations even and unlabored on room air. Pt hard of hearing, hearing aids at bedside. Pt oriented to room. Call bell within reach. Bed in low position.

## 2021-04-22 NOTE — Assessment & Plan Note (Addendum)
CT of the chest abdomen pelvis showing focal masslike thickening of the mid/distal esophagus concerning for primary esophageal malignancy. Start the patient on clear liquid diet, n.p.o. after midnight for possible EGD today.  Gently hydrate with normal saline at 75 ml per hour GI consulted and following.

## 2021-04-22 NOTE — Hospital Course (Addendum)
Hector Morris is a 76 y.o. male with medical history significant of AAA s/p repair in 2019, CAD, CABG in November 2022., hyperlipidemia, hypothyroidism, presents to ED for months of progressive difficulty swallowing water along with right-sided chest pain nonradiating, associated with nausea and vomiting progressively gets worse after eating. On arrival to ED, he appeared dehydrated. CT chest abdomen and pelvis done showing focal mass like thickening of the mid distal esophagus concerning for primary esophageal malignancy. GI consulted and the patient underwent EGD with biopsy of circumferential midesophageal mass on 04/22/2021 by Dr. Carlean Purl. The patient is able to tolerate po intake and wishes to be discharged with plans for GI to coordinate follow up based on biopsy results once available.

## 2021-04-22 NOTE — Anesthesia Postprocedure Evaluation (Signed)
Anesthesia Post Note  Patient: Hector Morris  Procedure(s) Performed: ESOPHAGOGASTRODUODENOSCOPY (EGD) WITH PROPOFOL BIOPSY     Patient location during evaluation: PACU Anesthesia Type: MAC Level of consciousness: awake and alert and oriented Pain management: pain level controlled Vital Signs Assessment: post-procedure vital signs reviewed and stable Respiratory status: spontaneous breathing, nonlabored ventilation and respiratory function stable Cardiovascular status: stable and blood pressure returned to baseline Postop Assessment: no apparent nausea or vomiting Anesthetic complications: no   No notable events documented.  Last Vitals:  Vitals:   04/22/21 1104 04/22/21 1230  BP: (!) 147/64 123/61  Pulse: 74 76  Resp: (!) 21 14  Temp: (!) 36.2 C 36.7 C  SpO2: 95% 95%    Last Pain:  Vitals:   04/22/21 1104  TempSrc: Temporal  PainSc: 0-No pain                 Bentleigh Stankus A.

## 2021-04-22 NOTE — Transfer of Care (Signed)
Immediate Anesthesia Transfer of Care Note  Patient: Hector Morris  Procedure(s) Performed: ESOPHAGOGASTRODUODENOSCOPY (EGD) WITH PROPOFOL BIOPSY  Patient Location: PACU  Anesthesia Type:MAC  Level of Consciousness: drowsy  Airway & Oxygen Therapy: Patient Spontanous Breathing and Patient connected to nasal cannula oxygen  Post-op Assessment: Report given to RN and Post -op Vital signs reviewed and stable  Post vital signs: Reviewed and stable  Last Vitals:  Vitals Value Taken Time  BP 123/61 04/22/21 1227  Temp    Pulse 74 04/22/21 1228  Resp 18 04/22/21 1228  SpO2 93 % 04/22/21 1228  Vitals shown include unvalidated device data.  Last Pain:  Vitals:   04/22/21 1104  TempSrc: Temporal  PainSc: 0-No pain         Complications: No notable events documented.

## 2021-04-22 NOTE — Assessment & Plan Note (Signed)
Patient reports right-sided chest pain, nonradiating and not associated with activity. Troponins are negative EKG does not show any ischemic changes. Holding aspirin for now

## 2021-04-22 NOTE — Assessment & Plan Note (Signed)
Replaced. °

## 2021-04-22 NOTE — Anesthesia Procedure Notes (Signed)
Procedure Name: MAC Date/Time: 04/22/2021 12:05 PM Performed by: Trinna Post., CRNA Pre-anesthesia Checklist: Patient identified, Emergency Drugs available, Suction available, Patient being monitored and Timeout performed Patient Re-evaluated:Patient Re-evaluated prior to induction Oxygen Delivery Method: Nasal cannula Preoxygenation: Pre-oxygenation with 100% oxygen Induction Type: IV induction Placement Confirmation: positive ETCO2

## 2021-04-22 NOTE — Assessment & Plan Note (Signed)
Rate controlled. Patient not able to take pills by mouth due to dysphagia Patient currently not on anticoagulation.

## 2021-04-22 NOTE — Op Note (Signed)
[facility] Patient Name: Hector Morris Procedure Date : 04/22/2021 MRN: [id number] Attending MD: Gatha Mayer , MD Date of Birth: January 08, 1946 CSN: [id number] Age: 76 Admit Type: Inpatient Procedure:                Upper GI endoscopy Indications:              Dysphagia, Abnormal CT of the GI tract Providers:                Gatha Mayer, MD, Carmie End, RN, Despina Pole, Technician, Tyna Jaksch Technician,                            Dewitt Hoes, CRNA Referring MD:              Medicines:                Monitored Anesthesia Care Complications:            No immediate complications. Estimated Blood Loss:     Estimated blood loss was minimal. Procedure:                Pre-Anesthesia Assessment:                           - Prior to the procedure, a History and Physical                            was performed, and patient medications and                            allergies were reviewed. The patient's tolerance of                            previous anesthesia was also reviewed. The risks                            and benefits of the procedure and the sedation                            options and risks were discussed with the patient.                            All questions were answered, and informed consent                            was obtained. Prior Anticoagulants: The patient has                            taken no previous anticoagulant or antiplatelet                            agents. ASA Grade Assessment: IV - A patient with  severe systemic disease that is a constant threat                            to life. After reviewing the risks and benefits,                            the patient was deemed in satisfactory condition to                            undergo the procedure.                           After obtaining informed consent, the endoscope was                            passed under  direct vision. Throughout the                            procedure, the patient's blood pressure, pulse, and                            oxygen saturations were monitored continuously. The                            [id number]-[id number] ([id number] Olympus endoscope was introduced                            through the mouth, and advanced to the second part                            of duodenum. The upper GI endoscopy was                            accomplished without difficulty. The patient                            tolerated the procedure well. Scope In: Scope Out: Findings:      A large, submucosal and ulcerating mass with no bleeding and no stigmata       of recent bleeding was found in the middle third of the esophagus, 30 to       35 cm from the incisors. The mass was partially obstructing and       circumferential. Biopsies were taken with a cold forceps for histology.       Verification of patient identification for the specimen was done.       Estimated blood loss was minimal.      A 3 cm hiatal hernia was present.      The gastroesophageal flap valve was visualized endoscopically and       classified as Hill Grade IV (no fold, wide open lumen, hiatal hernia       present).      The exam was otherwise without abnormality.      The cardia and gastric fundus were normal on retroflexion. Impression:               -  Partially obstructing, malignant esophageal tumor                            was found in the middle third of the esophagus.                            Biopsied.                           - 3 cm hiatal hernia.                           - Gastroesophageal flap valve classified as Hill                            Grade IV (no fold, wide open lumen, hiatal hernia                            present).                           - The examination was otherwise normal. Recommendation:           - Return patient to hospital ward for ongoing care.                           - Suspect he will  be able to go home soon.                           Rest of evaluation can be outpatient                           I will f/u pathology and coordinate oncology and CT                            surgery appointments once I see pathology.                           I have consulted dietitian to advise re: high                            protein nutrition Procedure Code(s):        --- Professional ---                           [phone], Esophagogastroduodenoscopy, flexible,                            transoral; with biopsy, single or multiple Diagnosis Code(s):        --- Professional ---                           C15.4, Malignant neoplasm of middle third of                            esophagus  K44.9, Diaphragmatic hernia without obstruction or                            gangrene                           R13.10, Dysphagia, unspecified                           R93.3, Abnormal findings on diagnostic imaging of                            other parts of digestive tract CPT copyright 2019 [employer]. All rights reserved. The codes documented in this report are preliminary and upon coder review may  be revised to meet current compliance requirements. Gatha Mayer, MD 04/22/2021 12:48:06 PM This report has been signed electronically. Number of Addenda: 0

## 2021-04-22 NOTE — ED Notes (Signed)
Pt transported to Endo for EGD.

## 2021-04-23 ENCOUNTER — Encounter (HOSPITAL_COMMUNITY): Payer: Self-pay | Admitting: Internal Medicine

## 2021-04-23 ENCOUNTER — Ambulatory Visit: Payer: Medicare Other | Admitting: Family Medicine

## 2021-04-23 DIAGNOSIS — Z8679 Personal history of other diseases of the circulatory system: Secondary | ICD-10-CM

## 2021-04-23 DIAGNOSIS — Z95828 Presence of other vascular implants and grafts: Secondary | ICD-10-CM

## 2021-04-23 DIAGNOSIS — Z951 Presence of aortocoronary bypass graft: Secondary | ICD-10-CM

## 2021-04-23 DIAGNOSIS — E785 Hyperlipidemia, unspecified: Secondary | ICD-10-CM | POA: Diagnosis not present

## 2021-04-23 DIAGNOSIS — E876 Hypokalemia: Secondary | ICD-10-CM

## 2021-04-23 DIAGNOSIS — R1319 Other dysphagia: Secondary | ICD-10-CM | POA: Diagnosis not present

## 2021-04-23 DIAGNOSIS — R9431 Abnormal electrocardiogram [ECG] [EKG]: Secondary | ICD-10-CM

## 2021-04-23 DIAGNOSIS — I25119 Atherosclerotic heart disease of native coronary artery with unspecified angina pectoris: Secondary | ICD-10-CM

## 2021-04-23 DIAGNOSIS — K2289 Other specified disease of esophagus: Secondary | ICD-10-CM | POA: Diagnosis not present

## 2021-04-23 LAB — BASIC METABOLIC PANEL
Anion gap: 9 (ref 5–15)
BUN: 11 mg/dL (ref 8–23)
CO2: 25 mmol/L (ref 22–32)
Calcium: 9 mg/dL (ref 8.9–10.3)
Chloride: 107 mmol/L (ref 98–111)
Creatinine, Ser: 1.28 mg/dL — ABNORMAL HIGH (ref 0.61–1.24)
GFR, Estimated: 58 mL/min — ABNORMAL LOW (ref >60)
Glucose, Bld: 100 mg/dL — ABNORMAL HIGH (ref 70–99)
Potassium: 4.1 mmol/L (ref 3.5–5.1)
Sodium: 141 mmol/L (ref 135–145)

## 2021-04-23 LAB — SURGICAL PATHOLOGY

## 2021-04-23 LAB — T3, FREE: T3, Free: 2.2 pg/mL (ref 2.0–4.4)

## 2021-04-23 MED ORDER — ONDANSETRON 4 MG PO TBDP: 4.0000 mg | ORAL_TABLET | Freq: Three times a day (TID) | ORAL | 0 refills | Status: AC | PRN

## 2021-04-23 NOTE — Plan of Care (Signed)
?  Problem: Education: ?Goal: Knowledge of General Education information will improve ?Description: Including pain rating scale, medication(s)/side effects and non-pharmacologic comfort measures ?Outcome: Progressing ?  ?Problem: Health Behavior/Discharge Planning: ?Goal: Ability to manage health-related needs will improve ?Outcome: Progressing ?  ?Problem: Clinical Measurements: ?Goal: Ability to maintain clinical measurements within normal limits will improve ?Outcome: Progressing ?Goal: Will remain free from infection ?Outcome: Progressing ?  ?Problem: Activity: ?Goal: Risk for activity intolerance will decrease ?Outcome: Progressing ?  ?Problem: Coping: ?Goal: Level of anxiety will decrease ?Outcome: Progressing ?  ?Problem: Elimination: ?Goal: Will not experience complications related to bowel motility ?Outcome: Progressing ?Goal: Will not experience complications related to urinary retention ?Outcome: Progressing ?  ?Problem: Pain Managment: ?Goal: General experience of comfort will improve ?Outcome: Progressing ?  ?Problem: Safety: ?Goal: Ability to remain free from injury will improve ?Outcome: Progressing ?  ?Problem: Clinical Measurements: ?Goal: Diagnostic test results will improve ?Outcome: Not Progressing ?  ?Problem: Nutrition: ?Goal: Adequate nutrition will be maintained ?Outcome: Not Progressing ?  ?

## 2021-04-23 NOTE — Progress Notes (Signed)
Discharge paperwork reviewed with pt. Pt verbalized understanding. Pt's wife and son at bedside. They will transport pt home via private vehicle.  ?

## 2021-04-23 NOTE — Progress Notes (Signed)
? ?  Spoke to wife. ?Path pending but makes sense to dc patient. ? ?I will contact after path in. ? ?They are interested in referral to [facility]. ? ?Please dc w/ some ondansetron. ? ?I have also suggested Premier protein shake supplements. ? ?Thanks ? ?Gatha Mayer, MD, Marval Regal ?[facility] ?04/23/2021 10:13 AM ? ?

## 2021-04-23 NOTE — Discharge Summary (Signed)
Physician Discharge Summary   Patient: Hector Morris MRN: [id number] DOB: Feb 15, 1946  Admit date:     04/21/2021  Discharge date: 04/23/21  Discharge Physician: Patrecia Pour   PCP: Caren Macadam, MD   Recommendations at discharge:  Follow up with Dr. Carlean Purl the results of esophageal mass biopsy taken at EGD 04/22/2021.   Discharge Diagnoses: Principal Problem:   Dysphagia Active Problems:   CAD (coronary artery disease), native coronary artery   Hyperlipidemia LDL goal <70   S/P AAA repair using bifurcation graft   S/P CABG x 3   Paroxysmal A-fib (HCC)   Hypokalemia   Hypothyroidism   Prolonged QT interval   Esophageal mass  Hospital Course: Hector Morris is a 76 y.o. male with medical history significant of AAA s/p repair in 2019, CAD, CABG in November 2022., hyperlipidemia, hypothyroidism, presents to ED for months of progressive difficulty swallowing water along with right-sided chest pain nonradiating, associated with nausea and vomiting progressively gets worse after eating. On arrival to ED, he appeared dehydrated. CT chest abdomen and pelvis done showing focal mass like thickening of the mid distal esophagus concerning for primary esophageal malignancy. GI consulted and the patient underwent EGD with biopsy of circumferential midesophageal mass on 04/22/2021 by Dr. Carlean Purl. The patient is able to tolerate po intake and wishes to be discharged with plans for GI to coordinate follow up based on biopsy results once available.    Assessment and Plan: Esophageal mass, suspected malignancy (squamous vs. adenocarcinoma):  - Follow up biopsy result from 2/28. D/w Dr. Carlean Purl who also discussed with pt and family. All in agreement. May pursue [facility] referral for oncology care.   Dysphagia: Stable, able to tolerate ensure.  - prn zofran ODT prescribed at discharge.    Hypokalemia- (present on admission) Replaced.  Paroxysmal A-fib ([place])- (present on admission) Rate  controlled.   CAD (coronary artery disease), native coronary artery- (present on admission) Patient reports right-sided chest pain, nonradiating and not associated with activity. Troponins are negative EKG does not show any ischemic changes. Can restart home aspirin   Consultants: GI Procedures performed: EGD 04/22/2021  Disposition: Home Diet recommendation:  Discharge Diet Orders (From admission, onward)     Start     Ordered   04/23/21 0000  Diet full liquid        04/23/21 4081           Full liquid diet  DISCHARGE MEDICATION: Allergies as of 04/23/2021       Reactions   Bee Venom Anaphylaxis, Swelling   Penicillins Anaphylaxis, Other (See Comments)   "Mushroom" popped up on hip Has patient had a PCN reaction causing immediate rash, facial/tongue/throat swelling, SOB or lightheadedness with hypotension: Yes Has patient had a PCN reaction causing severe rash involving mucus membranes or skin necrosis: Yes Has patient had a PCN reaction that required hospitalization: Yes - In hospital Has patient had a PCN reaction occurring within the last 10 years: No If all of the above answers are "NO", then may proceed with Cephalosporin use.   Shellfish Allergy Hives, Swelling   CRAB MEAT > WHELPS   Latex    UNSPECIFIED REACTION         Medication List     TAKE these medications    aspirin 325 MG EC tablet Take 1 tablet (325 mg total) by mouth daily.   capsaicin 0.025 % cream Commonly known as: ZOSTRIX Apply 1 application topically 2 (two) times daily as needed (for  feet).   levothyroxine 25 MCG tablet Commonly known as: Synthroid Take 1 tablet (25 mcg total) by mouth daily before breakfast. What changed: how much to take   metoprolol tartrate 25 MG tablet Commonly known as: LOPRESSOR Take 1 tablet (25 mg total) by mouth 2 (two) times daily.   omeprazole 20 MG capsule Commonly known as: PRILOSEC Take 40 mg by mouth daily. What changed: Another medication with  the same name was removed. Continue taking this medication, and follow the directions you see here.   ondansetron 4 MG disintegrating tablet Commonly known as: ZOFRAN-ODT Take 1 tablet (4 mg total) by mouth every 8 (eight) hours as needed for nausea or vomiting.   PARoxetine 20 MG tablet Commonly known as: PAXIL Take 10 mg by mouth in the morning and at bedtime.   rosuvastatin 40 MG tablet Commonly known as: CRESTOR Take 1 tablet (40 mg total) by mouth daily.   VITAMIN B-12 PO Take 1 tablet by mouth daily.   Vitamin D3 125 MCG (5000 UT) Caps Take 5,000 Units by mouth every morning.        Follow-up Information     Caren Macadam, MD Follow up.   Specialty: Family Medicine Contact information: [place] [address] [phone]         Gatha Mayer, MD Follow up.   Specialty: Gastroenterology Contact information: [address] [place] [address] [phone]                Subjective: Kept some liquids down, but did have emesis last night, having some loose stools as well. Would like to go home as soon as possible while awaiting pathology results. No abd pain, fever, bleeding, chest pain, or dyspnea.   Discharge Exam: Filed Weights   04/21/21 1321 04/22/21 1325  Weight: 88.5 kg 89.3 kg  BP (!) 149/71 (BP Location: Left Arm)    Pulse 84    Temp 97.7 F (36.5 C) (Axillary)    Resp 20    Ht 5\' 10"  (1.778 m)    Wt 89.3 kg    SpO2 98%    BMI 28.25 kg/m   Gen: Well-appearing 76yo male in no distress. MMM. Pulm: Clear, nonlabored CV: No edema or JVD.  GI: Soft, NT, ND +active BS Neuro: Alert,oriented, HOH, normal gait and speech.   Condition at discharge: stable  The results of significant diagnostics from this hospitalization (including imaging, microbiology, ancillary and laboratory) are listed below for reference.   Imaging Studies: DG Chest 2 View  Result Date: 04/21/2021 CLINICAL DATA:  Right-sided chest pain  EXAM: CHEST - 2 VIEW COMPARISON:  Chest radiograph 03/04/2021 FINDINGS: Postsurgical change reflecting prior CABG are again seen. The cardiomediastinal silhouette is stable. There is no focal consolidation or pulmonary edema. There is no pleural effusion or pneumothorax. There is no acute osseous abnormality. There is multilevel degenerative change of the thoracic spine. An abdominal aortic endograft is noted. IMPRESSION: No radiographic evidence of acute cardiopulmonary process. Electronically Signed   By: Valetta Mole M.D.   On: 04/21/2021 11:22   CT CHEST ABDOMEN PELVIS W CONTRAST  Result Date: 04/21/2021 CLINICAL DATA:  Weight loss EXAM: CT CHEST, ABDOMEN, AND PELVIS WITH CONTRAST TECHNIQUE: Multidetector CT imaging of the chest, abdomen and pelvis was performed following the standard protocol during bolus administration of intravenous contrast. RADIATION DOSE REDUCTION: This exam was performed according to the departmental dose-optimization program which includes automated exposure control, adjustment of the mA and/or  kV according to patient size and/or use of iterative reconstruction technique. CONTRAST:  39mL OMNIPAQUE IOHEXOL 350 MG/ML SOLN COMPARISON:  CT chest, abdomen and pelvis dated Jul 07, 2019 FINDINGS: CT CHEST FINDINGS Cardiovascular: Normal heart size. No pericardial effusion. Left main and three-vessel coronary artery calcifications status post CABG. Atherosclerotic disease of the thoracic aorta. Dilated ascending thoracic aorta measuring up to 4.1 cm, unchanged when compared with prior exams. Mediastinum/Nodes: Focal masslike thickening of the mid/distal esophagus measuring up to 4.3 x 3.1 cm. Mildly enlarged paraesophageal lymph nodes. Reference paraesophageal lymph node measuring 1.1 cm in short axis on series 3, image 42. Esophagus is unremarkable. Lungs/Pleura: Central airways are patent. Paraseptal and centrilobular emphysema. No consolidation, pleural effusion or pneumothorax. Right  lower lobe reticular opacities, unchanged compared to prior exams and likely due to scarring. Musculoskeletal: No chest wall mass or suspicious bone lesions identified. Prior median sternotomy with intact sternal wires. Old left-sided rib fractures. CT ABDOMEN PELVIS FINDINGS Hepatobiliary: No focal liver abnormality is seen. Status post cholecystectomy. No biliary dilatation. Pancreas: Unremarkable. No pancreatic ductal dilatation or surrounding inflammatory changes. Spleen: Normal in size without focal abnormality. Adrenals/Urinary Tract: Adrenal glands are unremarkable. Kidneys are normal, without renal calculi, focal lesion, or hydronephrosis. Bladder is unremarkable. Stomach/Bowel: Stomach is within normal limits. Diverticulosis primarily of the sigmoid colon. No evidence of bowel wall thickening, distention, or inflammatory changes. Vascular/Lymphatic: Prior aorta bi-iliac endovascular repair of infrarenal abdominal aortic aneurysm, unchanged in appearance when compared with prior exam. No pathologically enlarged lymph nodes seen in the abdomen or pelvis. Reproductive: Mild prostatomegaly. Other: Small fat containing left inguinal hernia. No abdominopelvic ascites. Musculoskeletal: No acute or significant osseous findings. IMPRESSION: 1. Focal masslike thickening of the mid/distal esophagus which associated mildly enlarged paraesophageal lymph nodes, findings are concerning for primary esophageal malignancy. 2. No evidence of metastatic disease in the abdomen or pelvis. 3. Prior aorta bi-iliac endovascular repair of infrarenal abdominal aortic aneurysm, unchanged in appearance when compared with prior exam. 4. Sigmoid diverticulosis with no diverticulitis. 5. Aortic Atherosclerosis (ICD10-I70.0) and Emphysema (ICD10-J43.9). * onc * Electronically Signed   By: Yetta Glassman M.D.   On: 04/21/2021 16:20    Microbiology: Results for orders placed or performed during the hospital encounter of 04/21/21  Resp  Panel by RT-PCR (Flu A&B, Covid) Nasopharyngeal Swab     Status: None   Collection Time: 04/21/21  4:32 PM   Specimen: Nasopharyngeal Swab; Nasopharyngeal(NP) swabs in vial transport medium  Result Value Ref Range Status   SARS Coronavirus 2 by RT PCR NEGATIVE NEGATIVE Final    Comment: (NOTE) SARS-CoV-2 target nucleic acids are NOT DETECTED.  The SARS-CoV-2 RNA is generally detectable in upper respiratory specimens during the acute phase of infection. The lowest concentration of SARS-CoV-2 viral copies this assay can detect is 138 copies/mL. A negative result does not preclude SARS-Cov-2 infection and should not be used as the sole basis for treatment or other patient management decisions. A negative result may occur with  improper specimen collection/handling, submission of specimen other than nasopharyngeal swab, presence of viral mutation(s) within the areas targeted by this assay, and inadequate number of viral copies(<138 copies/mL). A negative result must be combined with clinical observations, patient history, and epidemiological information. The expected result is Negative.  Fact Sheet for Patients:  EntrepreneurPulse.com.au  Fact Sheet for Healthcare Providers:  IncredibleEmployment.be  This test is no t yet approved or cleared by the [place] FDA and  has been authorized for detection and/or diagnosis of  SARS-CoV-2 by FDA under an Emergency Use Authorization (EUA). This EUA will remain  in effect (meaning this test can be used) for the duration of the COVID-19 declaration under Section 564(b)(1) of the Act, 21 U.S.C.section 360bbb-3(b)(1), unless the authorization is terminated  or revoked sooner.       Influenza A by PCR NEGATIVE NEGATIVE Final   Influenza B by PCR NEGATIVE NEGATIVE Final    Comment: (NOTE) The Xpert Xpress SARS-CoV-2/FLU/RSV plus assay is intended as an aid in the diagnosis of influenza from Nasopharyngeal  swab specimens and should not be used as a sole basis for treatment. Nasal washings and aspirates are unacceptable for Xpert Xpress SARS-CoV-2/FLU/RSV testing.  Fact Sheet for Patients: EntrepreneurPulse.com.au  Fact Sheet for Healthcare Providers: IncredibleEmployment.be  This test is not yet approved or cleared by the [place] FDA and has been authorized for detection and/or diagnosis of SARS-CoV-2 by FDA under an Emergency Use Authorization (EUA). This EUA will remain in effect (meaning this test can be used) for the duration of the COVID-19 declaration under Section 564(b)(1) of the Act, 21 U.S.C. section 360bbb-3(b)(1), unless the authorization is terminated or revoked.  Performed at [facility], [place] [address] [place], [place] [address]     Labs: CBC: Recent Labs  Lab 04/21/21 1055 04/22/21 0649  WBC 9.4 9.5  HGB 12.9* 12.6*  HCT 39.8 37.6*  MCV 91.5 89.5  PLT 284 604   Basic Metabolic Panel: Recent Labs  Lab 04/21/21 1055 04/22/21 0649 04/23/21 0328  NA 143 141 141  K 3.3* 3.4* 4.1  CL 106 106 107  CO2 27 25 25   GLUCOSE 95 98 100*  BUN 11 9 11   CREATININE 1.36* 1.24 1.28*  CALCIUM 9.1 9.0 9.0   Liver Function Tests: Recent Labs  Lab 04/21/21 1321 04/22/21 0649  AST 23 23  ALT 20 19  ALKPHOS 44 43  BILITOT 0.6 0.6  PROT 6.2* 6.3*  ALBUMIN 3.6 3.5   CBG: No results for input(s): GLUCAP in the last 168 hours.  Discharge time spent: greater than 30 minutes.  Signed: Patrecia Pour, MD [facility] 04/23/2021

## 2021-04-25 ENCOUNTER — Encounter: Payer: Self-pay | Admitting: Internal Medicine

## 2021-04-25 DIAGNOSIS — C159 Malignant neoplasm of esophagus, unspecified: Secondary | ICD-10-CM | POA: Insufficient documentation

## 2021-04-25 HISTORY — DX: Malignant neoplasm of esophagus, unspecified: C15.9

## 2021-04-28 ENCOUNTER — Ambulatory Visit: Payer: Medicare Other | Admitting: Nurse Practitioner

## 2021-05-03 DIAGNOSIS — C159 Malignant neoplasm of esophagus, unspecified: Secondary | ICD-10-CM | POA: Diagnosis present

## 2021-05-03 DIAGNOSIS — I712 Thoracic aortic aneurysm, without rupture, unspecified: Secondary | ICD-10-CM | POA: Diagnosis not present

## 2021-05-03 DIAGNOSIS — G9341 Metabolic encephalopathy: Secondary | ICD-10-CM | POA: Diagnosis not present

## 2021-05-03 DIAGNOSIS — J9600 Acute respiratory failure, unspecified whether with hypoxia or hypercapnia: Secondary | ICD-10-CM | POA: Diagnosis not present

## 2021-05-03 DIAGNOSIS — I10 Essential (primary) hypertension: Secondary | ICD-10-CM | POA: Diagnosis present

## 2021-05-03 DIAGNOSIS — Z4682 Encounter for fitting and adjustment of non-vascular catheter: Secondary | ICD-10-CM | POA: Diagnosis not present

## 2021-05-03 DIAGNOSIS — N400 Enlarged prostate without lower urinary tract symptoms: Secondary | ICD-10-CM | POA: Diagnosis not present

## 2021-05-03 DIAGNOSIS — I6501 Occlusion and stenosis of right vertebral artery: Secondary | ICD-10-CM | POA: Diagnosis present

## 2021-05-03 DIAGNOSIS — I480 Paroxysmal atrial fibrillation: Secondary | ICD-10-CM | POA: Diagnosis present

## 2021-05-03 DIAGNOSIS — R4182 Altered mental status, unspecified: Secondary | ICD-10-CM | POA: Diagnosis not present

## 2021-05-03 DIAGNOSIS — E860 Dehydration: Secondary | ICD-10-CM | POA: Diagnosis present

## 2021-05-03 DIAGNOSIS — G039 Meningitis, unspecified: Secondary | ICD-10-CM | POA: Diagnosis present

## 2021-05-03 DIAGNOSIS — G40409 Other generalized epilepsy and epileptic syndromes, not intractable, without status epilepticus: Secondary | ICD-10-CM | POA: Diagnosis present

## 2021-05-03 DIAGNOSIS — R569 Unspecified convulsions: Secondary | ICD-10-CM | POA: Diagnosis not present

## 2021-05-03 DIAGNOSIS — E039 Hypothyroidism, unspecified: Secondary | ICD-10-CM | POA: Diagnosis present

## 2021-05-03 DIAGNOSIS — G4733 Obstructive sleep apnea (adult) (pediatric): Secondary | ICD-10-CM | POA: Diagnosis not present

## 2021-05-03 DIAGNOSIS — F05 Delirium due to known physiological condition: Secondary | ICD-10-CM | POA: Diagnosis present

## 2021-05-03 DIAGNOSIS — E669 Obesity, unspecified: Secondary | ICD-10-CM | POA: Diagnosis present

## 2021-05-03 DIAGNOSIS — H9190 Unspecified hearing loss, unspecified ear: Secondary | ICD-10-CM | POA: Diagnosis not present

## 2021-05-03 DIAGNOSIS — N179 Acute kidney failure, unspecified: Secondary | ICD-10-CM | POA: Diagnosis present

## 2021-05-03 DIAGNOSIS — I25810 Atherosclerosis of coronary artery bypass graft(s) without angina pectoris: Secondary | ICD-10-CM | POA: Diagnosis not present

## 2021-05-03 DIAGNOSIS — N390 Urinary tract infection, site not specified: Secondary | ICD-10-CM | POA: Diagnosis present

## 2021-05-03 DIAGNOSIS — E876 Hypokalemia: Secondary | ICD-10-CM | POA: Diagnosis present

## 2021-05-03 DIAGNOSIS — R627 Adult failure to thrive: Secondary | ICD-10-CM | POA: Diagnosis present

## 2021-05-03 DIAGNOSIS — R471 Dysarthria and anarthria: Secondary | ICD-10-CM | POA: Diagnosis not present

## 2021-05-03 DIAGNOSIS — I739 Peripheral vascular disease, unspecified: Secondary | ICD-10-CM | POA: Diagnosis not present

## 2021-05-03 DIAGNOSIS — I25119 Atherosclerotic heart disease of native coronary artery with unspecified angina pectoris: Secondary | ICD-10-CM | POA: Diagnosis present

## 2021-05-03 DIAGNOSIS — Z66 Do not resuscitate: Secondary | ICD-10-CM | POA: Diagnosis present

## 2021-05-03 DIAGNOSIS — F329 Major depressive disorder, single episode, unspecified: Secondary | ICD-10-CM | POA: Diagnosis present

## 2021-05-03 DIAGNOSIS — K5730 Diverticulosis of large intestine without perforation or abscess without bleeding: Secondary | ICD-10-CM | POA: Diagnosis not present

## 2021-05-03 DIAGNOSIS — R29818 Other symptoms and signs involving the nervous system: Secondary | ICD-10-CM | POA: Diagnosis not present

## 2021-05-03 DIAGNOSIS — R1310 Dysphagia, unspecified: Secondary | ICD-10-CM | POA: Diagnosis not present

## 2021-05-03 DIAGNOSIS — K219 Gastro-esophageal reflux disease without esophagitis: Secondary | ICD-10-CM | POA: Diagnosis present

## 2021-05-03 DIAGNOSIS — Z20822 Contact with and (suspected) exposure to covid-19: Secondary | ICD-10-CM | POA: Diagnosis present

## 2021-05-04 DIAGNOSIS — I25119 Atherosclerotic heart disease of native coronary artery with unspecified angina pectoris: Secondary | ICD-10-CM | POA: Diagnosis present

## 2021-05-04 DIAGNOSIS — H9190 Unspecified hearing loss, unspecified ear: Secondary | ICD-10-CM | POA: Diagnosis not present

## 2021-05-04 DIAGNOSIS — I712 Thoracic aortic aneurysm, without rupture, unspecified: Secondary | ICD-10-CM | POA: Diagnosis present

## 2021-05-04 DIAGNOSIS — E876 Hypokalemia: Secondary | ICD-10-CM | POA: Diagnosis present

## 2021-05-04 DIAGNOSIS — I480 Paroxysmal atrial fibrillation: Secondary | ICD-10-CM | POA: Diagnosis present

## 2021-05-04 DIAGNOSIS — Z4682 Encounter for fitting and adjustment of non-vascular catheter: Secondary | ICD-10-CM | POA: Diagnosis not present

## 2021-05-04 DIAGNOSIS — I739 Peripheral vascular disease, unspecified: Secondary | ICD-10-CM | POA: Diagnosis present

## 2021-05-04 DIAGNOSIS — N179 Acute kidney failure, unspecified: Secondary | ICD-10-CM | POA: Diagnosis present

## 2021-05-04 DIAGNOSIS — G4733 Obstructive sleep apnea (adult) (pediatric): Secondary | ICD-10-CM | POA: Diagnosis not present

## 2021-05-04 DIAGNOSIS — G039 Meningitis, unspecified: Secondary | ICD-10-CM | POA: Diagnosis present

## 2021-05-04 DIAGNOSIS — R471 Dysarthria and anarthria: Secondary | ICD-10-CM | POA: Diagnosis not present

## 2021-05-04 DIAGNOSIS — Z20822 Contact with and (suspected) exposure to covid-19: Secondary | ICD-10-CM | POA: Diagnosis present

## 2021-05-04 DIAGNOSIS — Z66 Do not resuscitate: Secondary | ICD-10-CM | POA: Diagnosis present

## 2021-05-04 DIAGNOSIS — F05 Delirium due to known physiological condition: Secondary | ICD-10-CM | POA: Diagnosis present

## 2021-05-04 DIAGNOSIS — E860 Dehydration: Secondary | ICD-10-CM | POA: Diagnosis present

## 2021-05-04 DIAGNOSIS — G40409 Other generalized epilepsy and epileptic syndromes, not intractable, without status epilepticus: Secondary | ICD-10-CM | POA: Diagnosis present

## 2021-05-04 DIAGNOSIS — C159 Malignant neoplasm of esophagus, unspecified: Secondary | ICD-10-CM | POA: Diagnosis present

## 2021-05-04 DIAGNOSIS — N390 Urinary tract infection, site not specified: Secondary | ICD-10-CM | POA: Diagnosis present

## 2021-05-04 DIAGNOSIS — R1310 Dysphagia, unspecified: Secondary | ICD-10-CM | POA: Diagnosis not present

## 2021-05-04 DIAGNOSIS — E669 Obesity, unspecified: Secondary | ICD-10-CM | POA: Diagnosis present

## 2021-05-04 DIAGNOSIS — F329 Major depressive disorder, single episode, unspecified: Secondary | ICD-10-CM | POA: Diagnosis present

## 2021-05-04 DIAGNOSIS — J9600 Acute respiratory failure, unspecified whether with hypoxia or hypercapnia: Secondary | ICD-10-CM | POA: Diagnosis not present

## 2021-05-04 DIAGNOSIS — R627 Adult failure to thrive: Secondary | ICD-10-CM | POA: Diagnosis present

## 2021-05-04 DIAGNOSIS — E039 Hypothyroidism, unspecified: Secondary | ICD-10-CM | POA: Diagnosis present

## 2021-05-04 DIAGNOSIS — I6501 Occlusion and stenosis of right vertebral artery: Secondary | ICD-10-CM | POA: Diagnosis present

## 2021-05-04 DIAGNOSIS — I25810 Atherosclerosis of coronary artery bypass graft(s) without angina pectoris: Secondary | ICD-10-CM | POA: Diagnosis not present

## 2021-05-04 DIAGNOSIS — G9341 Metabolic encephalopathy: Secondary | ICD-10-CM | POA: Diagnosis not present

## 2021-05-04 DIAGNOSIS — R569 Unspecified convulsions: Secondary | ICD-10-CM | POA: Diagnosis not present

## 2021-05-04 DIAGNOSIS — I10 Essential (primary) hypertension: Secondary | ICD-10-CM | POA: Diagnosis present

## 2021-05-04 DIAGNOSIS — K219 Gastro-esophageal reflux disease without esophagitis: Secondary | ICD-10-CM | POA: Diagnosis present

## 2021-05-04 DIAGNOSIS — N400 Enlarged prostate without lower urinary tract symptoms: Secondary | ICD-10-CM | POA: Diagnosis not present

## 2021-05-04 DIAGNOSIS — K5730 Diverticulosis of large intestine without perforation or abscess without bleeding: Secondary | ICD-10-CM | POA: Diagnosis not present

## 2021-05-04 DIAGNOSIS — R4182 Altered mental status, unspecified: Secondary | ICD-10-CM | POA: Diagnosis not present

## 2021-05-05 DIAGNOSIS — C159 Malignant neoplasm of esophagus, unspecified: Secondary | ICD-10-CM | POA: Diagnosis not present

## 2021-05-05 DIAGNOSIS — K5730 Diverticulosis of large intestine without perforation or abscess without bleeding: Secondary | ICD-10-CM | POA: Diagnosis not present

## 2021-05-06 DIAGNOSIS — Z20822 Contact with and (suspected) exposure to covid-19: Secondary | ICD-10-CM | POA: Diagnosis not present

## 2021-05-10 DIAGNOSIS — Z20822 Contact with and (suspected) exposure to covid-19: Secondary | ICD-10-CM | POA: Diagnosis not present

## 2021-05-24 DEATH — deceased
